# Patient Record
Sex: Male | Born: 1945
Health system: Southern US, Community
[De-identification: ages and names within clinical notes are randomized; demographics above are authoritative.]

## PROBLEM LIST (undated history)

## (undated) DIAGNOSIS — K219 Gastro-esophageal reflux disease without esophagitis: Secondary | ICD-10-CM

## (undated) DIAGNOSIS — A6000 Herpesviral infection of urogenital system, unspecified: Secondary | ICD-10-CM

## (undated) DIAGNOSIS — Z720 Tobacco use: Secondary | ICD-10-CM

## (undated) DIAGNOSIS — R918 Other nonspecific abnormal finding of lung field: Secondary | ICD-10-CM

## (undated) DIAGNOSIS — R001 Bradycardia, unspecified: Secondary | ICD-10-CM

## (undated) DIAGNOSIS — L570 Actinic keratosis: Secondary | ICD-10-CM

## (undated) DIAGNOSIS — E119 Type 2 diabetes mellitus without complications: Secondary | ICD-10-CM

## (undated) DIAGNOSIS — Z7982 Long term (current) use of aspirin: Secondary | ICD-10-CM

## (undated) DIAGNOSIS — M4802 Spinal stenosis, cervical region: Secondary | ICD-10-CM

## (undated) DIAGNOSIS — H04123 Dry eye syndrome of bilateral lacrimal glands: Secondary | ICD-10-CM

## (undated) DIAGNOSIS — I2510 Atherosclerotic heart disease of native coronary artery without angina pectoris: Secondary | ICD-10-CM

## (undated) DIAGNOSIS — I440 Atrioventricular block, first degree: Secondary | ICD-10-CM

## (undated) DIAGNOSIS — G4730 Sleep apnea, unspecified: Secondary | ICD-10-CM

## (undated) DIAGNOSIS — N200 Calculus of kidney: Secondary | ICD-10-CM

## (undated) DIAGNOSIS — I480 Paroxysmal atrial fibrillation: Secondary | ICD-10-CM

## (undated) DIAGNOSIS — I441 Atrioventricular block, second degree: Secondary | ICD-10-CM

## (undated) DIAGNOSIS — I712 Thoracic aortic aneurysm, without rupture, unspecified: Secondary | ICD-10-CM

## (undated) DIAGNOSIS — C439 Malignant melanoma of skin, unspecified: Secondary | ICD-10-CM

## (undated) DIAGNOSIS — Z9841 Cataract extraction status, right eye: Secondary | ICD-10-CM

## (undated) DIAGNOSIS — I700 Atherosclerosis of aorta: Secondary | ICD-10-CM

## (undated) DIAGNOSIS — G629 Polyneuropathy, unspecified: Secondary | ICD-10-CM

## (undated) DIAGNOSIS — C61 Malignant neoplasm of prostate: Secondary | ICD-10-CM

## (undated) DIAGNOSIS — M51369 Other intervertebral disc degeneration, lumbar region without mention of lumbar back pain or lower extremity pain: Secondary | ICD-10-CM

## (undated) DIAGNOSIS — K760 Fatty (change of) liver, not elsewhere classified: Secondary | ICD-10-CM

## (undated) DIAGNOSIS — I4510 Unspecified right bundle-branch block: Secondary | ICD-10-CM

## (undated) DIAGNOSIS — I5030 Unspecified diastolic (congestive) heart failure: Secondary | ICD-10-CM

## (undated) DIAGNOSIS — D329 Benign neoplasm of meninges, unspecified: Secondary | ICD-10-CM

## (undated) DIAGNOSIS — K52832 Lymphocytic colitis: Secondary | ICD-10-CM

## (undated) DIAGNOSIS — E785 Hyperlipidemia, unspecified: Secondary | ICD-10-CM

## (undated) DIAGNOSIS — I10 Essential (primary) hypertension: Secondary | ICD-10-CM

## (undated) DIAGNOSIS — I509 Heart failure, unspecified: Secondary | ICD-10-CM

## (undated) DIAGNOSIS — G3184 Mild cognitive impairment, so stated: Secondary | ICD-10-CM

## (undated) DIAGNOSIS — R2689 Other abnormalities of gait and mobility: Secondary | ICD-10-CM

## (undated) DIAGNOSIS — Z8679 Personal history of other diseases of the circulatory system: Secondary | ICD-10-CM

## (undated) HISTORY — DX: Herpesviral infection of urogenital system, unspecified: A60.00

## (undated) HISTORY — DX: Malignant melanoma of skin, unspecified: C43.9

## (undated) HISTORY — DX: Essential (primary) hypertension: I10

## (undated) HISTORY — DX: Malignant neoplasm of prostate: C61

## (undated) HISTORY — PX: PROSTATE SURGERY: SHX751

## (undated) HISTORY — PX: TONSILLECTOMY: SHX5217

## (undated) HISTORY — DX: Gastro-esophageal reflux disease without esophagitis: K21.9

## (undated) HISTORY — PX: HERNIA REPAIR: SHX51

## (undated) HISTORY — DX: Actinic keratosis: L57.0

## (undated) HISTORY — PX: APPENDECTOMY: SHX54

---

## 2015-02-27 DIAGNOSIS — K219 Gastro-esophageal reflux disease without esophagitis: Secondary | ICD-10-CM | POA: Diagnosis not present

## 2015-02-27 DIAGNOSIS — Z136 Encounter for screening for cardiovascular disorders: Secondary | ICD-10-CM | POA: Diagnosis not present

## 2015-02-27 DIAGNOSIS — Z0000 Encounter for general adult medical examination without abnormal findings: Secondary | ICD-10-CM | POA: Diagnosis not present

## 2015-02-27 DIAGNOSIS — I10 Essential (primary) hypertension: Secondary | ICD-10-CM | POA: Diagnosis not present

## 2015-02-27 DIAGNOSIS — A6000 Herpesviral infection of urogenital system, unspecified: Secondary | ICD-10-CM | POA: Diagnosis not present

## 2015-11-05 DIAGNOSIS — Z23 Encounter for immunization: Secondary | ICD-10-CM | POA: Diagnosis not present

## 2016-03-24 DIAGNOSIS — K219 Gastro-esophageal reflux disease without esophagitis: Secondary | ICD-10-CM | POA: Diagnosis not present

## 2016-03-24 DIAGNOSIS — Z0000 Encounter for general adult medical examination without abnormal findings: Secondary | ICD-10-CM | POA: Diagnosis not present

## 2016-03-24 DIAGNOSIS — Z8546 Personal history of malignant neoplasm of prostate: Secondary | ICD-10-CM | POA: Diagnosis not present

## 2016-03-24 DIAGNOSIS — E782 Mixed hyperlipidemia: Secondary | ICD-10-CM | POA: Diagnosis not present

## 2016-03-24 DIAGNOSIS — I10 Essential (primary) hypertension: Secondary | ICD-10-CM | POA: Diagnosis not present

## 2016-03-24 DIAGNOSIS — A6000 Herpesviral infection of urogenital system, unspecified: Secondary | ICD-10-CM | POA: Diagnosis not present

## 2016-03-24 DIAGNOSIS — Z23 Encounter for immunization: Secondary | ICD-10-CM | POA: Diagnosis not present

## 2017-03-29 DIAGNOSIS — Z23 Encounter for immunization: Secondary | ICD-10-CM | POA: Diagnosis not present

## 2017-03-29 DIAGNOSIS — A6000 Herpesviral infection of urogenital system, unspecified: Secondary | ICD-10-CM | POA: Diagnosis not present

## 2017-03-29 DIAGNOSIS — I10 Essential (primary) hypertension: Secondary | ICD-10-CM | POA: Diagnosis not present

## 2017-03-29 DIAGNOSIS — E782 Mixed hyperlipidemia: Secondary | ICD-10-CM | POA: Diagnosis not present

## 2017-03-29 DIAGNOSIS — K219 Gastro-esophageal reflux disease without esophagitis: Secondary | ICD-10-CM | POA: Diagnosis not present

## 2017-03-29 DIAGNOSIS — Z0000 Encounter for general adult medical examination without abnormal findings: Secondary | ICD-10-CM | POA: Diagnosis not present

## 2017-03-29 DIAGNOSIS — Z8546 Personal history of malignant neoplasm of prostate: Secondary | ICD-10-CM | POA: Diagnosis not present

## 2017-06-24 DIAGNOSIS — M720 Palmar fascial fibromatosis [Dupuytren]: Secondary | ICD-10-CM | POA: Diagnosis not present

## 2017-06-24 DIAGNOSIS — M79641 Pain in right hand: Secondary | ICD-10-CM | POA: Diagnosis not present

## 2017-08-11 DIAGNOSIS — M1811 Unilateral primary osteoarthritis of first carpometacarpal joint, right hand: Secondary | ICD-10-CM | POA: Diagnosis not present

## 2017-08-11 DIAGNOSIS — M79641 Pain in right hand: Secondary | ICD-10-CM | POA: Diagnosis not present

## 2017-09-20 DIAGNOSIS — M13841 Other specified arthritis, right hand: Secondary | ICD-10-CM | POA: Diagnosis not present

## 2017-11-09 DIAGNOSIS — M13841 Other specified arthritis, right hand: Secondary | ICD-10-CM | POA: Diagnosis not present

## 2017-11-17 DIAGNOSIS — H2513 Age-related nuclear cataract, bilateral: Secondary | ICD-10-CM | POA: Diagnosis not present

## 2018-01-20 DIAGNOSIS — M13841 Other specified arthritis, right hand: Secondary | ICD-10-CM | POA: Diagnosis not present

## 2018-01-20 DIAGNOSIS — M65331 Trigger finger, right middle finger: Secondary | ICD-10-CM | POA: Diagnosis not present

## 2018-04-06 DIAGNOSIS — I10 Essential (primary) hypertension: Secondary | ICD-10-CM | POA: Diagnosis not present

## 2018-04-06 DIAGNOSIS — E782 Mixed hyperlipidemia: Secondary | ICD-10-CM | POA: Diagnosis not present

## 2018-04-13 DIAGNOSIS — Z87891 Personal history of nicotine dependence: Secondary | ICD-10-CM | POA: Diagnosis not present

## 2018-04-13 DIAGNOSIS — Z1283 Encounter for screening for malignant neoplasm of skin: Secondary | ICD-10-CM | POA: Diagnosis not present

## 2018-04-13 DIAGNOSIS — Z0000 Encounter for general adult medical examination without abnormal findings: Secondary | ICD-10-CM | POA: Diagnosis not present

## 2018-04-13 DIAGNOSIS — K219 Gastro-esophageal reflux disease without esophagitis: Secondary | ICD-10-CM | POA: Diagnosis not present

## 2018-04-13 DIAGNOSIS — Z8546 Personal history of malignant neoplasm of prostate: Secondary | ICD-10-CM | POA: Diagnosis not present

## 2018-04-13 DIAGNOSIS — I10 Essential (primary) hypertension: Secondary | ICD-10-CM | POA: Diagnosis not present

## 2018-04-13 DIAGNOSIS — A6000 Herpesviral infection of urogenital system, unspecified: Secondary | ICD-10-CM | POA: Diagnosis not present

## 2018-04-13 DIAGNOSIS — E782 Mixed hyperlipidemia: Secondary | ICD-10-CM | POA: Diagnosis not present

## 2018-04-13 DIAGNOSIS — M19041 Primary osteoarthritis, right hand: Secondary | ICD-10-CM | POA: Diagnosis not present

## 2018-04-25 DIAGNOSIS — M65331 Trigger finger, right middle finger: Secondary | ICD-10-CM | POA: Diagnosis not present

## 2018-06-29 DIAGNOSIS — M65331 Trigger finger, right middle finger: Secondary | ICD-10-CM | POA: Diagnosis not present

## 2018-10-05 DIAGNOSIS — E782 Mixed hyperlipidemia: Secondary | ICD-10-CM | POA: Diagnosis not present

## 2018-10-12 DIAGNOSIS — Z87891 Personal history of nicotine dependence: Secondary | ICD-10-CM | POA: Diagnosis not present

## 2018-10-12 DIAGNOSIS — E782 Mixed hyperlipidemia: Secondary | ICD-10-CM | POA: Diagnosis not present

## 2018-10-12 DIAGNOSIS — I10 Essential (primary) hypertension: Secondary | ICD-10-CM | POA: Diagnosis not present

## 2018-10-12 DIAGNOSIS — M19041 Primary osteoarthritis, right hand: Secondary | ICD-10-CM | POA: Diagnosis not present

## 2018-10-12 DIAGNOSIS — Z8546 Personal history of malignant neoplasm of prostate: Secondary | ICD-10-CM | POA: Diagnosis not present

## 2018-10-12 DIAGNOSIS — K219 Gastro-esophageal reflux disease without esophagitis: Secondary | ICD-10-CM | POA: Diagnosis not present

## 2018-10-12 DIAGNOSIS — A6000 Herpesviral infection of urogenital system, unspecified: Secondary | ICD-10-CM | POA: Diagnosis not present

## 2018-10-12 DIAGNOSIS — Z79899 Other long term (current) drug therapy: Secondary | ICD-10-CM | POA: Diagnosis not present

## 2018-11-02 DIAGNOSIS — M720 Palmar fascial fibromatosis [Dupuytren]: Secondary | ICD-10-CM | POA: Diagnosis not present

## 2018-11-02 DIAGNOSIS — M65331 Trigger finger, right middle finger: Secondary | ICD-10-CM | POA: Diagnosis not present

## 2019-01-17 ENCOUNTER — Ambulatory Visit: Payer: PPO | Admitting: Urology

## 2019-01-17 ENCOUNTER — Other Ambulatory Visit: Payer: Self-pay

## 2019-01-17 ENCOUNTER — Encounter: Payer: Self-pay | Admitting: Urology

## 2019-01-17 VITALS — BP 177/78 | HR 92 | Ht 71.0 in | Wt 240.0 lb

## 2019-01-17 DIAGNOSIS — N5231 Erectile dysfunction following radical prostatectomy: Secondary | ICD-10-CM | POA: Diagnosis not present

## 2019-01-17 DIAGNOSIS — Z8546 Personal history of malignant neoplasm of prostate: Secondary | ICD-10-CM

## 2019-01-17 DIAGNOSIS — N393 Stress incontinence (female) (male): Secondary | ICD-10-CM | POA: Diagnosis not present

## 2019-01-17 NOTE — Progress Notes (Signed)
01/17/2019 5:39 PM   Marzetta Board Maris Berger [birth year] [id number]  Referring provider: Wayland Denis, PA-C [address] [place],  [place] [address]  Chief Complaint  Patient presents with  . New Patient (Initial Visit)    HPI: 73 year old male with a personal history of high risk prostate cancer status post radical prostatectomy who presents today to establish care.  He underwent robotic radical prostatectomy for Gleason 4+5 disease in 2012 along with bilateral pelvic lymph node dissection which indicated extraprostatic extension, bilateral seminal vesicle disease.  Lymph nodes were negative.   He underwent adjuvant radiation therapy.  He was previously followed by Dr. Rhodia Albright at [facility] urology and was last seen in 2015.   His PSA remained undetectable until 2014 at which time a 0.01, and 0.02 in 2015.  He is not quite back to his urologist or had his primary care check in 5 years.  He primarily is concerned today about urinary leakage.  He reports that he was not leakage about a year ago.  He does report that over the past year, he has gained 50 pounds from overeating in a more sedentary lifestyle.  He also previously did physical therapy including Kegel exercises which she is no longer doing.  Is very bothered by this.  He does not wear any pads but often has damp underwear and jeans.  He is concerned about the chronic odor and coating in his underwear.  He and his wife are sexually active via oral sex.  He does not have any erectile function this they do not engage in penetrative sex.  He is also concerned today that his wife no longer is interested in performing oral sex due to leakage of urine upon orgasm as well as the odor.  He previously failed PDE 5 inhibitors and vacuum erection device.  He did have some efficacy with intracavernosal injections but is no longer interested in doing these.  He is not interested in penile prosthesis.   PMH: Past Medical History:   Diagnosis Date  . Chronic GERD   . Genital herpes   . Hypertension   . Prostate CA [facility])     Surgical History: Past Surgical History:  Procedure Laterality Date  . APPENDECTOMY    . HERNIA REPAIR    . PROSTATE SURGERY    . TONSILLECTOMY      Home Medications:  Allergies as of 01/17/2019   No Known Allergies     Medication List       Accurate as of January 17, 2019  5:39 PM. If you have any questions, ask your nurse or doctor.        acyclovir 400 MG tablet Commonly known as: ZOVIRAX Take 400 mg by mouth 2 (two) times daily.   amLODipine 5 MG tablet Commonly known as: NORVASC amlodipine 5 mg tablet   atorvastatin 40 MG tablet Commonly known as: LIPITOR atorvastatin 40 mg tablet   etodolac 300 MG capsule Commonly known as: LODINE etodolac 300 mg capsule   hydrochlorothiazide 25 MG tablet Commonly known as: HYDRODIURIL hydrochlorothiazide 25 mg tablet   lisinopril 40 MG tablet Commonly known as: ZESTRIL lisinopril 40 mg tablet   meloxicam 15 MG tablet Commonly known as: MOBIC meloxicam 15 mg tablet   omeprazole 40 MG capsule Commonly known as: PRILOSEC omeprazole 40 mg capsule,delayed release       Allergies: No Known Allergies  Family History: History reviewed. No pertinent family history.  Social History:  reports that he has  never smoked. He has never used smokeless tobacco. He reports current alcohol use. He reports that he does not use drugs.  ROS: UROLOGY Frequent Urination?: Yes Hard to postpone urination?: Yes Burning/pain with urination?: No Get up at night to urinate?: Yes Leakage of urine?: Yes Urine stream starts and stops?: No Trouble starting stream?: No Do you have to strain to urinate?: No Blood in urine?: No Urinary tract infection?: No Sexually transmitted disease?: No Injury to kidneys or bladder?: No Painful intercourse?: No Weak stream?: No Erection problems?: No Penile pain?: No  Gastrointestinal Nausea?: No  Vomiting?: No Indigestion/heartburn?: No Diarrhea?: No Constipation?: No  Constitutional Fever: No Night sweats?: No Weight loss?: No Fatigue?: No  Skin Skin rash/lesions?: No Itching?: No  Eyes Blurred vision?: No Double vision?: No  Ears/Nose/Throat Sore throat?: No Sinus problems?: Yes  Hematologic/Lymphatic Swollen glands?: No Easy bruising?: No  Cardiovascular Leg swelling?: No Chest pain?: No  Respiratory Cough?: No Shortness of breath?: No  Endocrine Excessive thirst?: No  Musculoskeletal Back pain?: No Joint pain?: No  Neurological Headaches?: No Dizziness?: No  Psychologic Depression?: No Anxiety?: No  Physical Exam: BP (!) 177/78   Pulse 92   Ht 5\' 11"  (1.803 m)   Wt 240 lb (108.9 kg)   BMI 33.47 kg/m   Constitutional:  Alert and oriented, No acute distress. HEENT: [place] AT, moist mucus membranes.  Trachea midline, no masses. Cardiovascular: No clubbing, cyanosis, or edema. Respiratory: Normal respiratory effort, no increased work of breathing. GI: Abdomen is soft, nontender, nondistended, no abdominal masses, obese Skin: No rashes, bruises or suspicious lesions. Neurologic: Grossly intact, no focal deficits, moving all 4 extremities. Psychiatric: Normal mood and affect.   Assessment & Plan:    1. History of prostate cancer Personal history of high risk prostate cancer status post prostatectomy and salvage radiation  No PSAs in the past 5 years thus will repeat today  I recommended that he continue with least annual PSA check given his history of high risk disease - PSA  2. Stress incontinence of urine Worsening of stress urinary continence particular over the past year associated with 50 pound weight gain  We discussed options moving forward which include surgical intervention in the form of AUS versus sling and more conservative options which include weight loss and pelvic floor strengthening.  Given that he was dry a year  ago, I strongly feel that more conservative option would likely be very effective.  I have offered him referral to physical therapy but he declined.  He would like to just try to lose weight on his own and he knows how to do pelvic floor exercises.  He is not interested in surgical intervention at this time.  In addition to the above, we did discuss more intermediate options including penile clamp such as Cunningham clamp.  Is not interested in this at this point in time.  To let us know if he would like to pursue this down the road. - PSA  3. Erectile dysfunction after radical prostatectomy Lengthy discussion today about his erectile function, ultimately not interested in any further intervention - PSA    Hollice Espy, MD  [facility] [address] [place], [place] [address] [phone]

## 2019-01-18 LAB — PSA: Prostate Specific Ag, Serum: 0.2 ng/mL (ref 0.0–4.0)

## 2019-01-27 DIAGNOSIS — Z20828 Contact with and (suspected) exposure to other viral communicable diseases: Secondary | ICD-10-CM | POA: Diagnosis not present

## 2019-01-30 ENCOUNTER — Ambulatory Visit
Admission: RE | Admit: 2019-01-30 | Discharge: 2019-01-30 | Disposition: A | Discharge status: Home / Self Care | Payer: PPO | Source: Ambulatory Visit | Attending: Physician Assistant | Admitting: Physician Assistant

## 2019-01-30 ENCOUNTER — Other Ambulatory Visit: Payer: Self-pay

## 2019-01-30 ENCOUNTER — Other Ambulatory Visit: Payer: Self-pay | Admitting: Physician Assistant

## 2019-01-30 DIAGNOSIS — R05 Cough: Secondary | ICD-10-CM | POA: Diagnosis not present

## 2019-01-30 DIAGNOSIS — J309 Allergic rhinitis, unspecified: Secondary | ICD-10-CM | POA: Diagnosis not present

## 2019-01-30 DIAGNOSIS — R059 Cough, unspecified: Secondary | ICD-10-CM

## 2019-01-30 DIAGNOSIS — J329 Chronic sinusitis, unspecified: Secondary | ICD-10-CM | POA: Diagnosis not present

## 2019-01-30 DIAGNOSIS — Z720 Tobacco use: Secondary | ICD-10-CM | POA: Diagnosis not present

## 2019-01-30 DIAGNOSIS — K219 Gastro-esophageal reflux disease without esophagitis: Secondary | ICD-10-CM | POA: Diagnosis not present

## 2019-01-30 IMAGING — CR DG CHEST 2V
2 series · 2 of 2 positions shown · non-contrast
Comparison: No prior.

CLINICAL DATA: Cough.

EXAM:
CHEST - 2 VIEW

[chest pa]
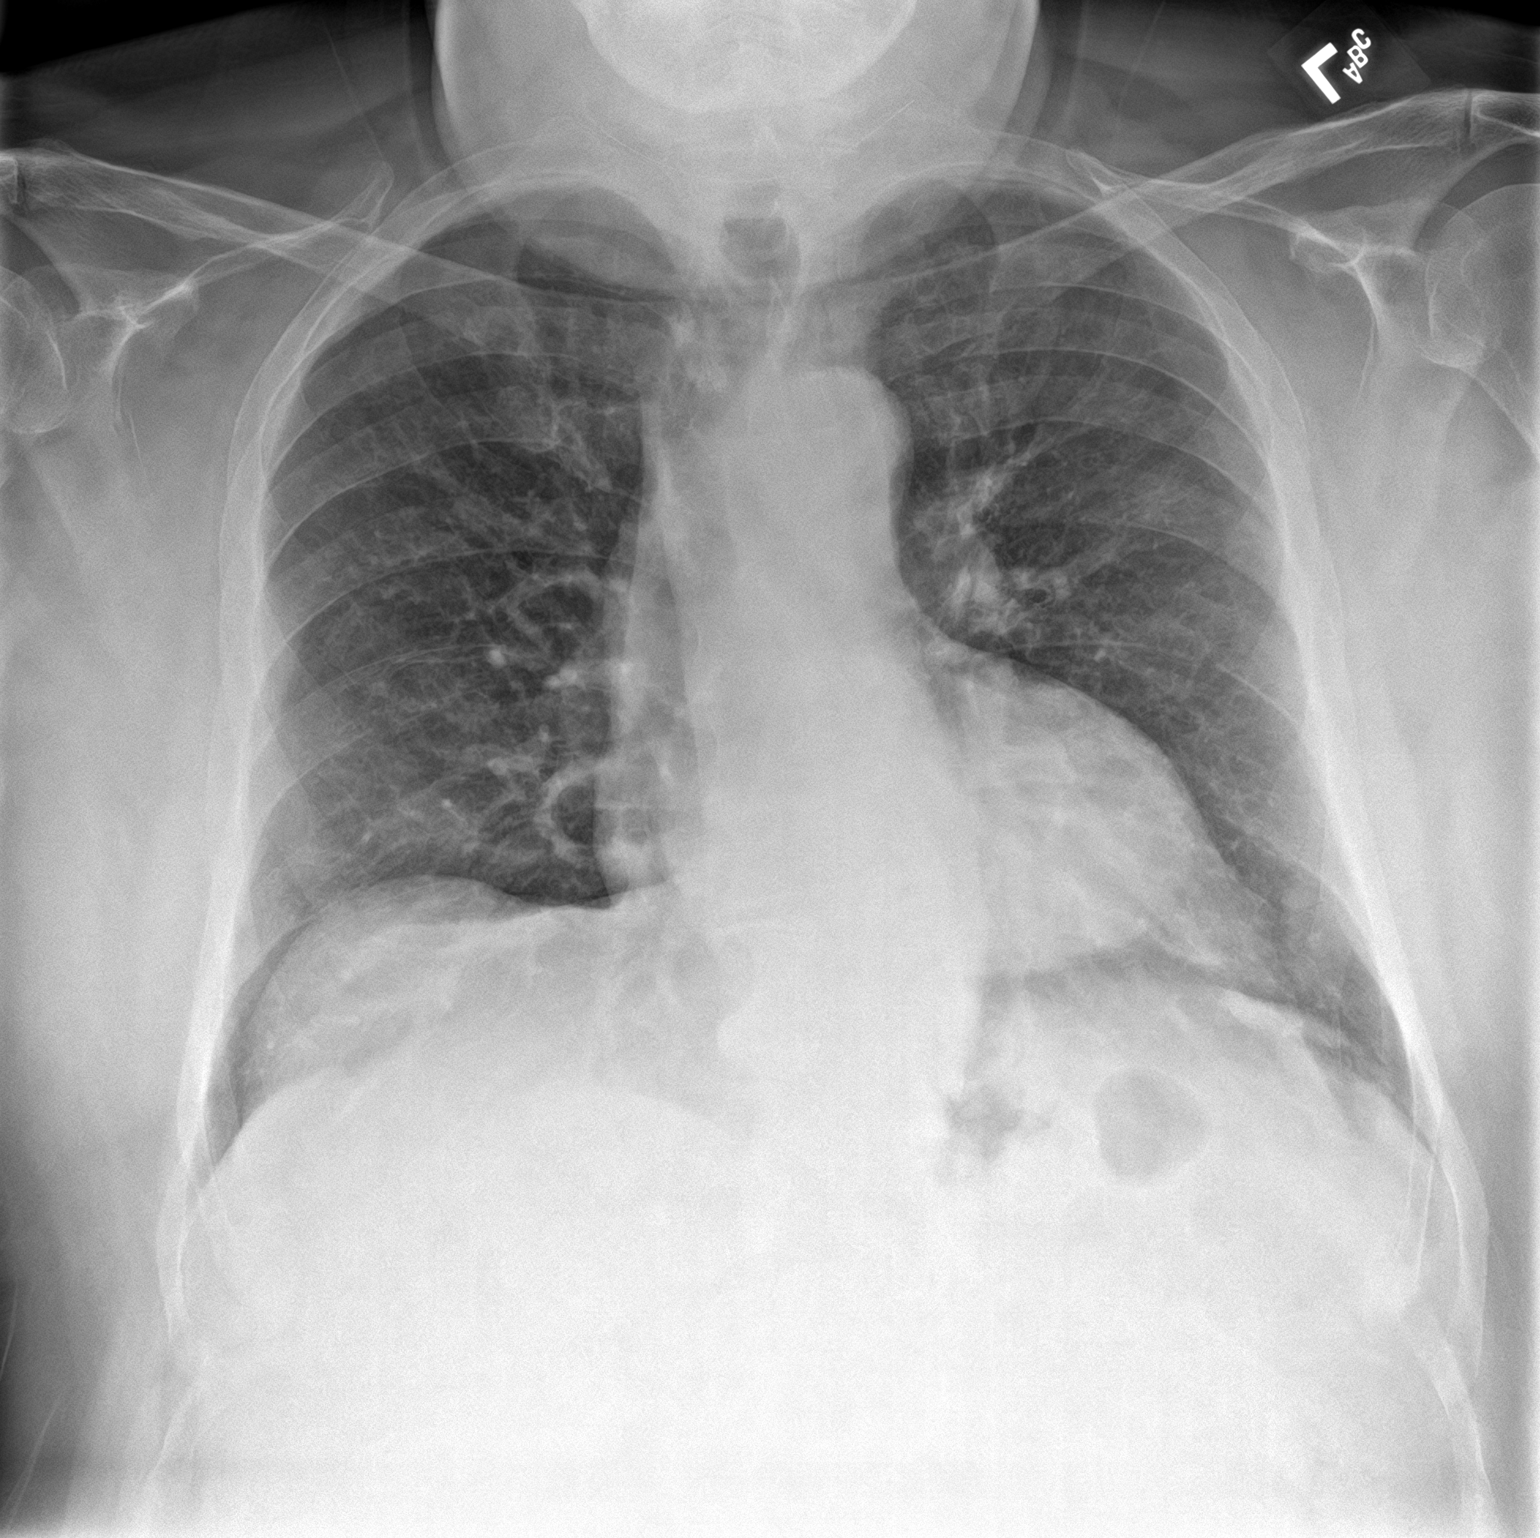

[chest lat]
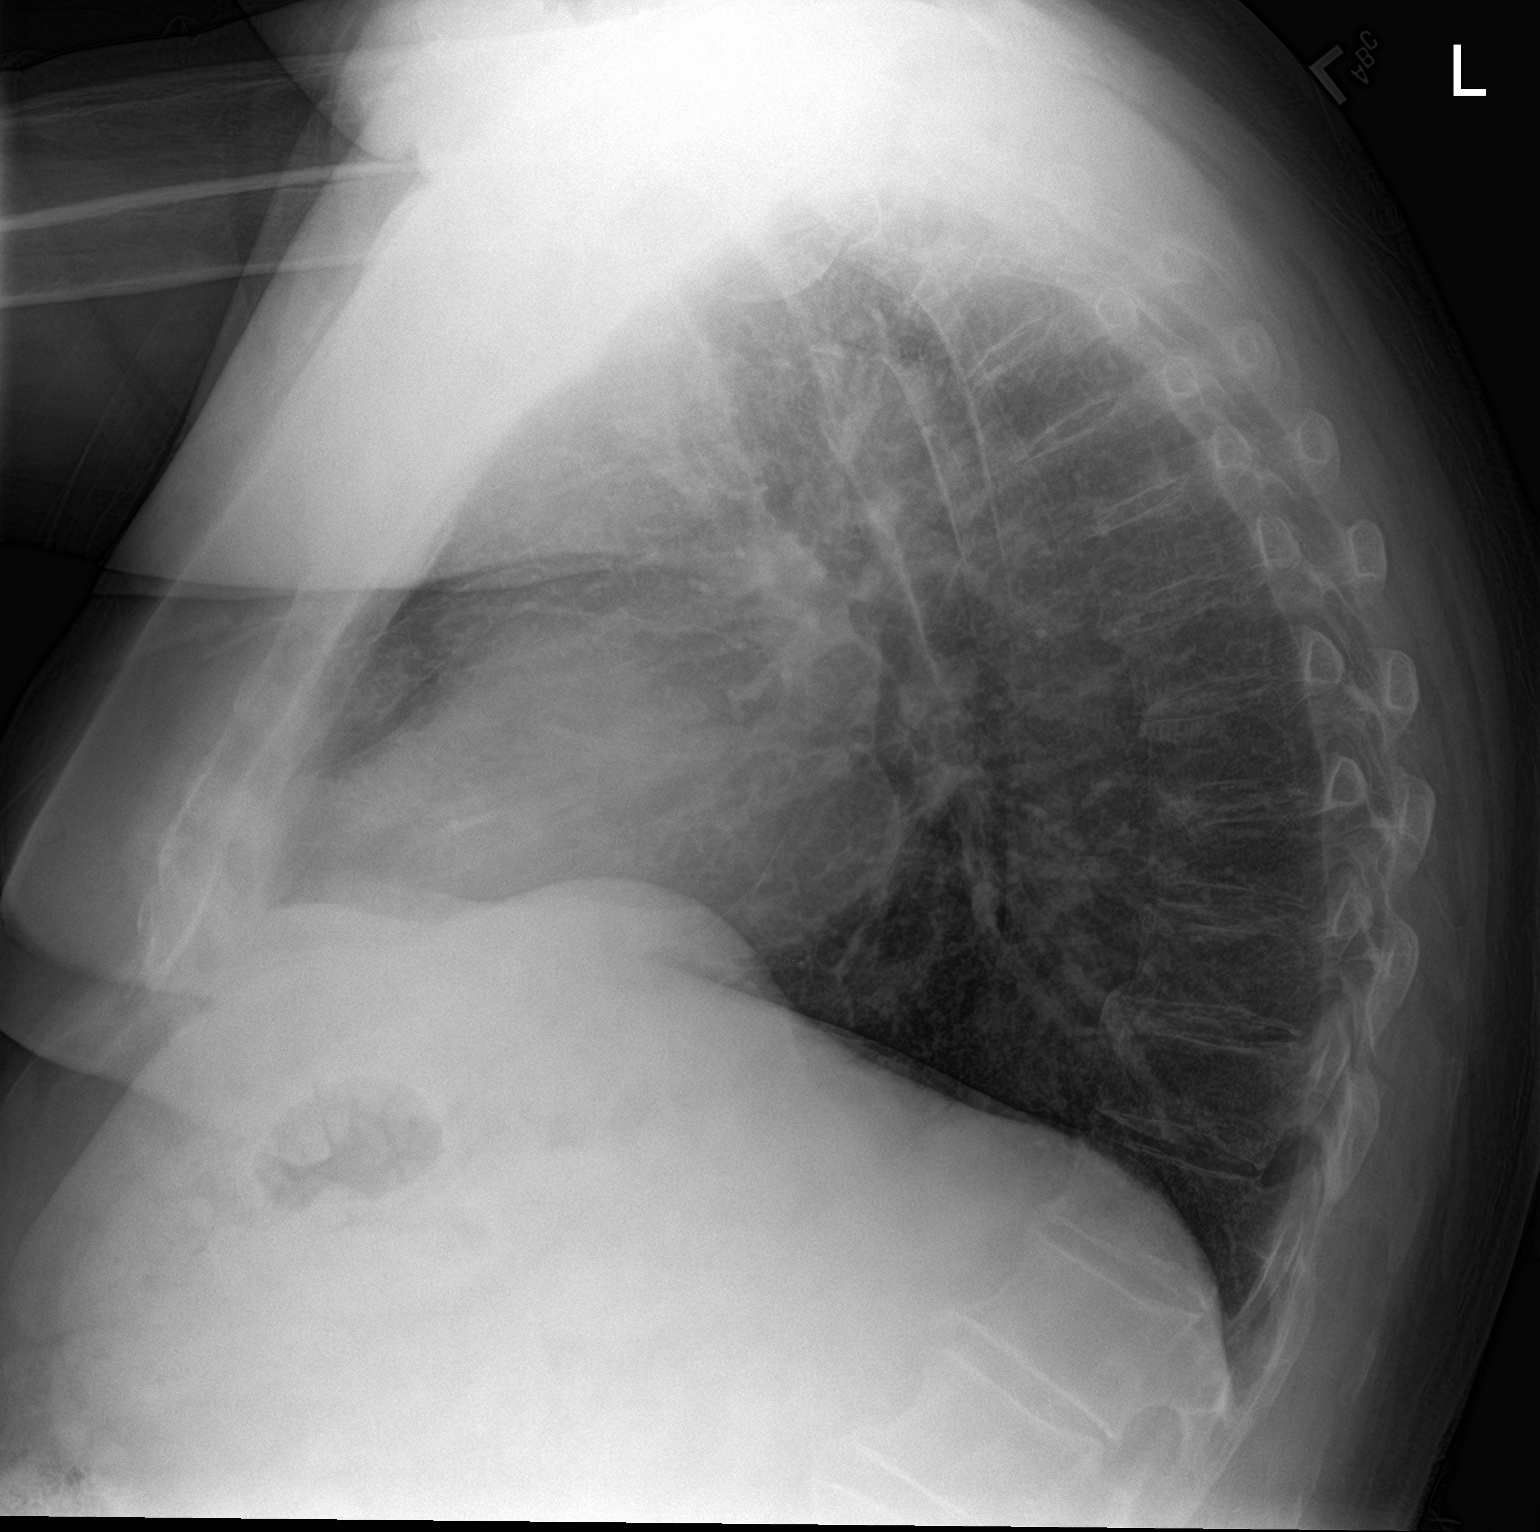

[2 of 2 positions shown; findings below may reference images not displayed]

FINDINGS: Mediastinum and hilar structures normal. Heart size normal. No
pulmonary venous congestion. Questionable cavitary focus in the
medial right base seen on PA view. This could represent a true
cavitary lesion versus superimposition of shadows. Follow-up PA and
lateral chest x-ray suggested. If question remains of a cavitary
lesion, contrast-enhanced chest CT should be considered. Nodular
opacity noted projected over the left lung base on PA view. This
could represent a nipple shadow. The above recommended repeat PA and
lateral chest x-ray should be obtained with nipple markers
suggested. No pleural effusion or pneumothorax. Diffuse osteopenia
degenerative change thoracic spine. No acute bony abnormality.
IMPRESSION: 1. Questionable cavitary focus in the medial right base seen on PA
view. This could represent a true cavitary lesion versus
superimposition of shadows. Follow-up PA and lateral chest x-ray
suggested. If question remains of a cavitary lesion,
contrast-enhanced chest CT should be considered.

2. Nodular opacity noted projected over the left lung base on PA
view. This could represent a nipple shadow. Repeat PA and lateral
chest x-ray recommended above should be obtained with nipple markers
suggested.

## 2019-02-13 DIAGNOSIS — J984 Other disorders of lung: Secondary | ICD-10-CM | POA: Diagnosis not present

## 2019-02-13 DIAGNOSIS — R9389 Abnormal findings on diagnostic imaging of other specified body structures: Secondary | ICD-10-CM | POA: Diagnosis not present

## 2019-02-20 ENCOUNTER — Telehealth: Payer: Self-pay | Admitting: Internal Medicine

## 2019-02-20 NOTE — Telephone Encounter (Signed)
[facility] called and said that [facility] told pt he needed to get a repeat Chest Xray, but he told them that since he has an appt with [facility], he doesn't think he needs to follow up with them anymore. They are wanting to know if ENT needs to follow up with him or if we are going to follow up on the CXR. [facility] is faxing [place] the results of his previous CXR. He has a consult visit w/ [facility] on 03/07/2019. Phone number: [phone]

## 2019-02-20 NOTE — Telephone Encounter (Signed)
Will await CXR.

## 2019-02-21 NOTE — Telephone Encounter (Addendum)
CXR has been received and placed in Jason Washington's folder for review.   Jason Washington, please advise if you will follow up on CXR?

## 2019-03-07 ENCOUNTER — Other Ambulatory Visit: Payer: Self-pay

## 2019-03-07 ENCOUNTER — Ambulatory Visit: Payer: PPO | Admitting: Internal Medicine

## 2019-03-07 ENCOUNTER — Telehealth: Payer: Self-pay | Admitting: Internal Medicine

## 2019-03-07 ENCOUNTER — Encounter: Payer: Self-pay | Admitting: Internal Medicine

## 2019-03-07 VITALS — BP 158/84 | HR 84 | Temp 97.2°F | Ht 71.0 in | Wt 259.0 lb

## 2019-03-07 DIAGNOSIS — R911 Solitary pulmonary nodule: Secondary | ICD-10-CM

## 2019-03-07 DIAGNOSIS — R0602 Shortness of breath: Secondary | ICD-10-CM | POA: Diagnosis not present

## 2019-03-07 DIAGNOSIS — R9389 Abnormal findings on diagnostic imaging of other specified body structures: Secondary | ICD-10-CM | POA: Diagnosis not present

## 2019-03-07 MED ORDER — ALBUTEROL SULFATE HFA 108 (90 BASE) MCG/ACT IN AERS: 2.0000 | INHALATION_SPRAY | RESPIRATORY_TRACT | 10 refills | Status: DC | PRN

## 2019-03-07 NOTE — Telephone Encounter (Signed)
DK, please advise. thanks

## 2019-03-07 NOTE — Patient Instructions (Signed)
Obtain CT CHEST to look for cancer  Check PFT's to check for COPD  Check oxygen levels with 6MWT and ONO  START ALBUTEROL INHALER

## 2019-03-07 NOTE — Telephone Encounter (Signed)
Rhonda please advise. thanks

## 2019-03-07 NOTE — Telephone Encounter (Signed)
Pt in office for OV and CXR was discussed during visit.

## 2019-03-07 NOTE — Progress Notes (Signed)
Name: Jason Washington MRN: [id number] DOB: 04-06-1945     CONSULTATION DATE: 03/07/2019  REFERRING MD : Eulas Post  CHIEF COMPLAINT: Shortness of breath and abnormal chest x-ray  HISTORY OF PRESENT ILLNESS: 74 year old pleasant white male morbidly obese presents to the office today for evaluation for abnormal chest x-ray with chronic cough for 3 months  Patient started to have cough several months ago nonproductive in nature but intermittently productive Patient was evaluated by ENT and there was no acute process per the patient  At this time patient does have some hoarse voice and some increased fatigue along with some increased shortness of breath and dyspnea exertion that has been progressively worsening over the last several years and he definitely has been worsening over the last several months  Patient denies any weight loss but has gained 50 pounds over the last several years Patient does have extensive smoking history of 1 pack a day for approximately 40 years He quit 10 years ago however restarted smoking several months ago  At this time chest x-ray was obtained on December 2020 Findings were viewed with the patient there is a right midlung cavitary lesion along with a left lung nodular opacity  I have explained to the patient he will need CT chest for further evaluation I have explained to him I am concerned for primary lung cancer   Patient does have increased fatigue increased shortness of breath and dyspnea on exertion over the last several months I am also concerned about COPD and emphysema I have explained to the patient he will need pulmonary function testing to assess for emphysema and COPD Patient does have intermittent wheezing  No of COPD exacerbation at this time No evidence of heart failure at this time No evidence or signs of infection at this time No respiratory distress No fevers, chills, nausea, vomiting, diarrhea No evidence of lower extremity  edema No evidence hemoptysis    PAST MEDICAL HISTORY :   has a past medical history of Chronic GERD, Genital herpes, Hypertension, and Prostate CA ([place]).  has a past surgical history that includes Appendectomy; Prostate surgery; Hernia repair; and Tonsillectomy. Prior to Admission medications   Medication Sig Start Date End Date Taking? Authorizing Provider  acyclovir (ZOVIRAX) 400 MG tablet Take 400 mg by mouth 2 (two) times daily. 01/04/19  Yes [provider]  amLODipine (NORVASC) 5 MG tablet amlodipine 5 mg tablet 09/28/13  Yes [provider]  atorvastatin (LIPITOR) 40 MG tablet atorvastatin 40 mg tablet 04/13/18  Yes [provider]  etodolac (LODINE) 300 MG capsule etodolac 300 mg capsule   Yes [provider]  hydrochlorothiazide (HYDRODIURIL) 25 MG tablet hydrochlorothiazide 25 mg tablet 09/13/13  Yes [provider]  lisinopril (ZESTRIL) 40 MG tablet lisinopril 40 mg tablet 09/13/13  Yes [provider]  meloxicam (MOBIC) 15 MG tablet meloxicam 15 mg tablet   Yes [provider]  omeprazole (PRILOSEC) 40 MG capsule omeprazole 40 mg capsule,delayed release 09/04/13  Yes [provider]   No Known Allergies  FAMILY HISTORY:  family history is not on file. SOCIAL HISTORY:  reports that he has never smoked. He has never used smokeless tobacco. He reports current alcohol use. He reports that he does not use drugs.    Review of Systems:  Gen:  Denies  fever, sweats, chills weigh loss  HEENT: Denies blurred vision, double vision, ear pain, eye pain, hearing loss, nose bleeds, sore throat Cardiac:  No dizziness, chest pain or  heaviness, chest tightness,edema, No JVD Resp:   No cough, -sputum production, +shortness of breath,+wheezing, -hemoptysis,  Gi: Denies swallowing difficulty, stomach pain, nausea or vomiting, diarrhea, constipation, bowel incontinence Gu:  Denies bladder incontinence, burning urine Ext:    Denies Joint pain, stiffness or swelling Skin: Denies  skin rash, easy bruising or bleeding or hives Endoc:  Denies polyuria, polydipsia , polyphagia or weight change Psych:   Denies depression, insomnia or hallucinations  Other:  All other systems negative   BP (!) 158/84 (BP Location: Left Arm, Cuff Size: Normal)   Pulse 84   Temp (!) 97.2 F (36.2 C) (Temporal)   Ht 5\' 11"  (1.803 m)   Wt 259 lb (117.5 kg)   SpO2 97%   BMI 36.12 kg/m    Physical Examination:   GENERAL:NAD, no fevers, chills, no weakness no fatigue HEAD: Normocephalic, atraumatic.  EYES: PERLA, EOMI No scleral icterus.  MOUTH: Moist mucosal membrane.  EAR, NOSE, THROAT: Clear without exudates. No external lesions.  NECK: Supple.  PULMONARY: CTA B/L no wheezing, rhonchi, crackles CARDIOVASCULAR: S1 and S2. Regular rate and rhythm. No murmurs GASTROINTESTINAL: Soft, nontender, nondistended. Positive bowel sounds.  MUSCULOSKELETAL: No swelling, clubbing, or edema.  NEUROLOGIC: No gross focal neurological deficits. 5/5 strength all extremities SKIN: No ulceration, lesions, rashes, or cyanosis.  PSYCHIATRIC: Insight, judgment intact. -depression -anxiety ALL OTHER ROS ARE NEGATIVE   MEDICATIONS: I have reviewed all medications and confirmed regimen as documented      IMAGING   Chest x-ray 12/20 Independently reviewed by me today There is a right midlung opacification looks like a cavitary lesion Recommend CT chest for further evaluation   ASSESSMENT AND PLAN SYNOPSIS 73 year old morbidly obese white male with progressive shortness of breath and dyspnea exertion with chronic cough for the last several months with extensive smoking history in the setting of abnormal chest x-ray with right midlung cavitary lesion most likely findings consistent with primary lung cancer however will need CT of the chest for further evaluation.  With progressive dyspnea and exertional shortness of breath patient most likely  has underlying COPD and will need pulmonary function testing to assess for lung function, will also need to be assessed for exertional and nocturnal hypoxia  Shortness of breath and dyspnea exertion Likely COPD Obtain pulmonary function testing Check for exertional hypoxia with 6-minute walk test Check for nocturnal hypoxia with overnight pulse oximetry  +wheezing and progressive shortness of breath Albuterol as needed Patient to be provided in the office   Abnormal chest x-ray with right midlung cavitary lesion I am very concerned that this could be primary lung cancer Patient will need CT of the chest for further assessment     COVID-19 EDUCATION: The signs and symptoms of COVID-19 were discussed with the patient and how to seek care for testing.  The importance of social distancing was discussed today. Hand Washing Techniques and avoid touching face was advised.     MEDICATION ADJUSTMENTS/LABS AND TESTS ORDERED: CT chest needed for further evaluation Butyryl as needed Obtain pulmonary function testing Check 6-minute walk test Check overnight pulse oximetry   CURRENT MEDICATIONS REVIEWED AT LENGTH WITH PATIENT TODAY   Patient satisfied with Plan of action and management. All questions answered  Follow up in 3 months    Stanford Strauch Patricia Pesa, M.D.  [facility]  Medical Director [place] Director [facility] Cardio-Pulmonary Department

## 2019-03-07 NOTE — Telephone Encounter (Signed)
Pt stated that he received call from [employer] stating that he would need to come and pick up the device from [place].  LMOVM for Darlina Guys to return my call.  Spoke with Melissa with [employer] and she stated that she would check into this issue. [employer] does have a branch in [place] and does not know why patient was advised to go to [place]. [name]

## 2019-03-08 NOTE — Telephone Encounter (Signed)
Melissa with [employer] called back this morning and stated that [employer] will reach out to patient this this morning and arrange for this device to be delivered to patient. [place] and spoke with patient at 11:20 and pt stated that he has heard from [employer] and that device will be delivered to his home and picked up the next day.  Pt thanked me for the help. Nothing else needed at this time. [name]

## 2019-03-12 ENCOUNTER — Ambulatory Visit (INDEPENDENT_AMBULATORY_CARE_PROVIDER_SITE_OTHER): Payer: PPO

## 2019-03-12 ENCOUNTER — Other Ambulatory Visit: Payer: Self-pay

## 2019-03-12 DIAGNOSIS — R0609 Other forms of dyspnea: Secondary | ICD-10-CM

## 2019-03-12 DIAGNOSIS — J449 Chronic obstructive pulmonary disease, unspecified: Secondary | ICD-10-CM | POA: Diagnosis not present

## 2019-03-12 DIAGNOSIS — R0600 Dyspnea, unspecified: Secondary | ICD-10-CM | POA: Diagnosis not present

## 2019-03-12 DIAGNOSIS — R0902 Hypoxemia: Secondary | ICD-10-CM | POA: Diagnosis not present

## 2019-03-12 NOTE — Progress Notes (Signed)
SIX MIN WALK 03/12/2019  Medications zovirax 400mg , Norvasc 5mg , Lipitor 40mg , Lisinopril 40mg , Mobic 15mg  and prilosec 40mg   Supplimental Oxygen during Test? (L/min) No  Laps 10  Partial Lap (in Meters) 0  Baseline BP (sitting) 132/72  Baseline Heartrate 94  Baseline Dyspnea (Borg Scale) 0  Baseline Fatigue (Borg Scale) 0  Baseline SPO2 95  BP (sitting) 144/86  Heartrate 100  Dyspnea (Borg Scale) 0  Fatigue (Borg Scale) 0  SPO2 95  BP (sitting) 138/82  Heartrate 90  SPO2 96  Stopped or Paused before Six Minutes No  Distance Completed 340  Provider Comments: pt completed test at moderate pace with no complaints.

## 2019-03-19 ENCOUNTER — Ambulatory Visit
Admission: RE | Admit: 2019-03-19 | Discharge: 2019-03-19 | Disposition: A | Discharge status: Home / Self Care | Payer: PPO | Source: Ambulatory Visit | Attending: Internal Medicine | Admitting: Internal Medicine

## 2019-03-19 ENCOUNTER — Telehealth: Payer: Self-pay | Admitting: Internal Medicine

## 2019-03-19 ENCOUNTER — Other Ambulatory Visit: Payer: Self-pay

## 2019-03-19 DIAGNOSIS — R918 Other nonspecific abnormal finding of lung field: Secondary | ICD-10-CM | POA: Diagnosis not present

## 2019-03-19 DIAGNOSIS — R911 Solitary pulmonary nodule: Secondary | ICD-10-CM | POA: Diagnosis not present

## 2019-03-19 IMAGING — CT CT CHEST W/O CM
2 of 4 series · 15 of 36 positions shown, 18 images · non-contrast
Comparison: [DATE].

CLINICAL DATA: Lung nodule.

EXAM:
CT CHEST WITHOUT CONTRAST
TECHNIQUE: Multidetector CT imaging of the chest was performed following the
standard protocol without IV contrast.

[Series 2: chest 2.00 · axial · 0.75mm/px · z∈[-1214,-924]mm · 12 of 173 slices shown, 15 images]
[im 14/173  mediastinal]
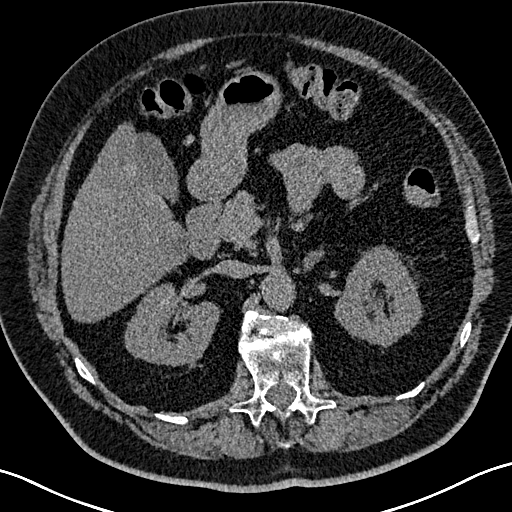
[im 14/173  lung]
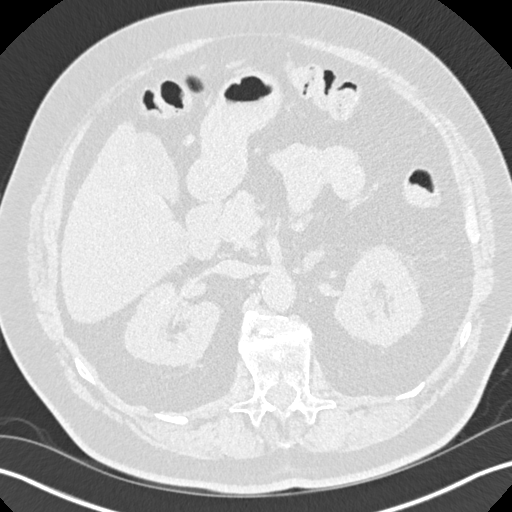
[im 27/173  lung]
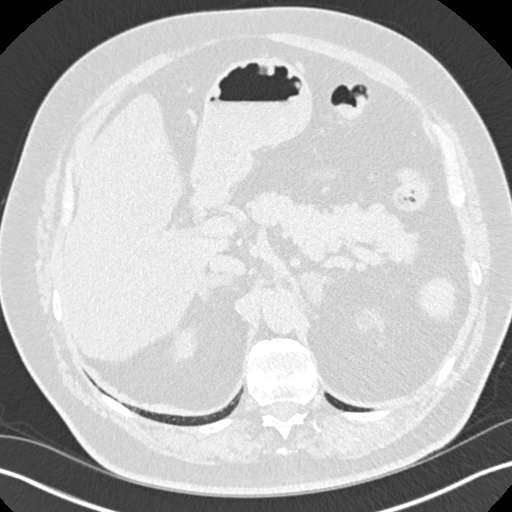
[im 40/173  lung]
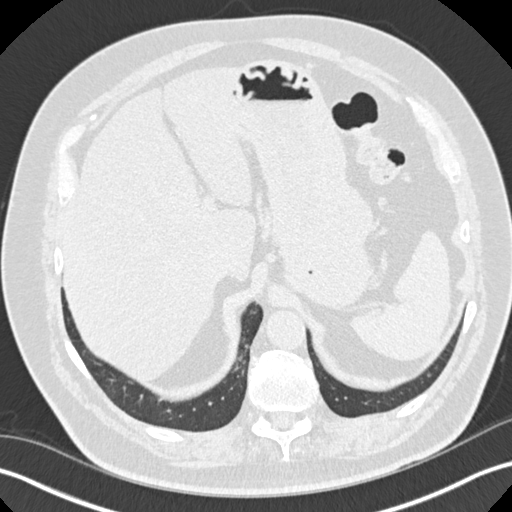
[im 53/173  lung]
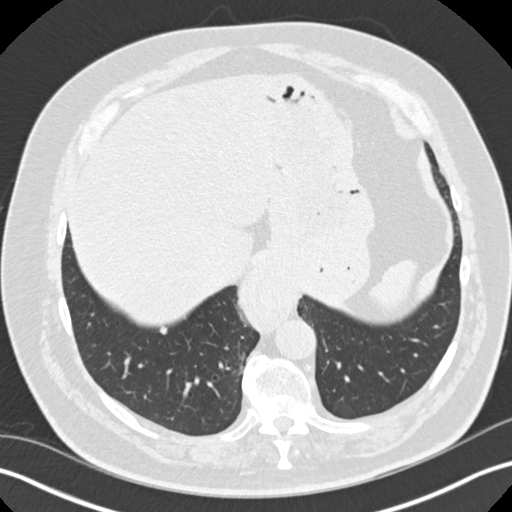
[im 67/173  mediastinal]
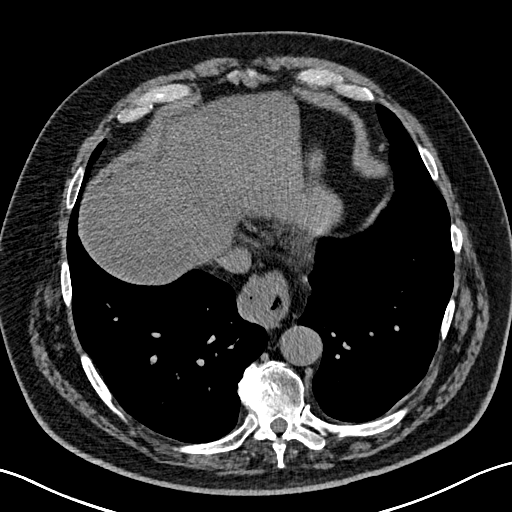
[im 67/173  lung]
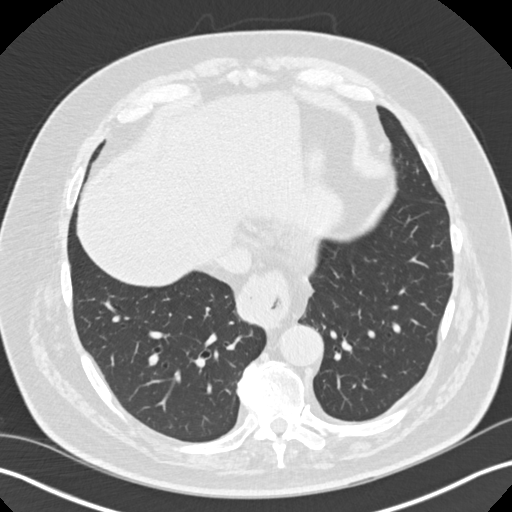
[im 80/173  lung]
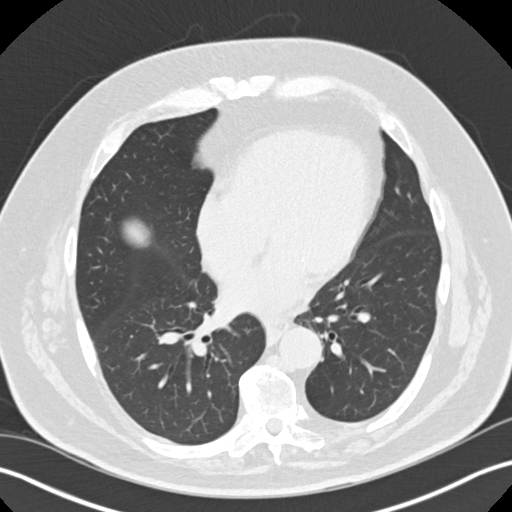
[im 93/173  lung]
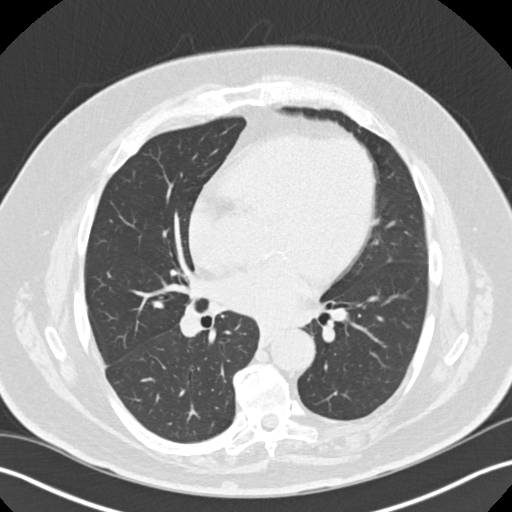
[im 106/173  lung]
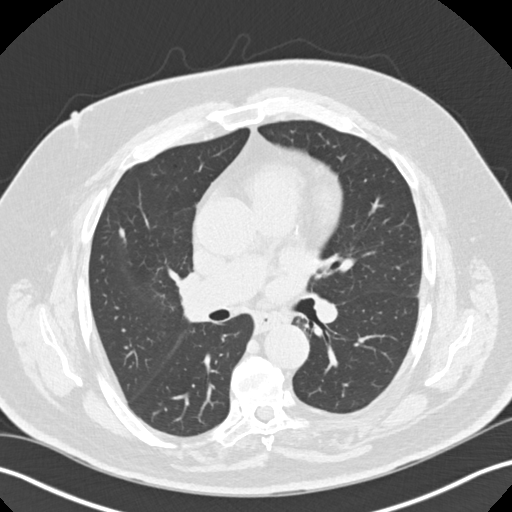
[im 120/173  mediastinal]
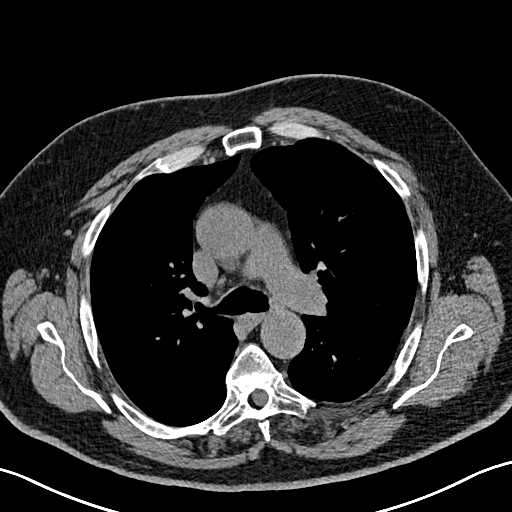
[im 120/173  lung]
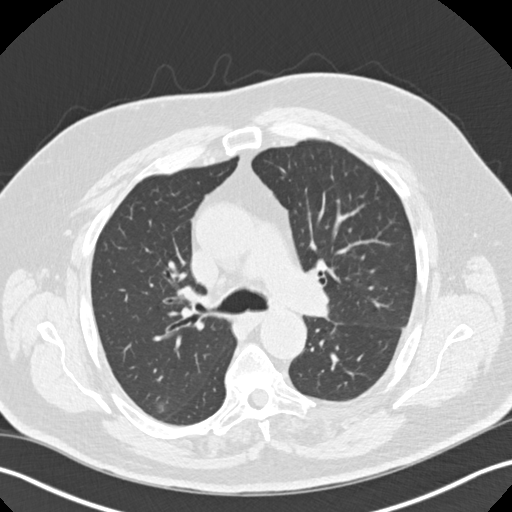
[im 133/173  lung]
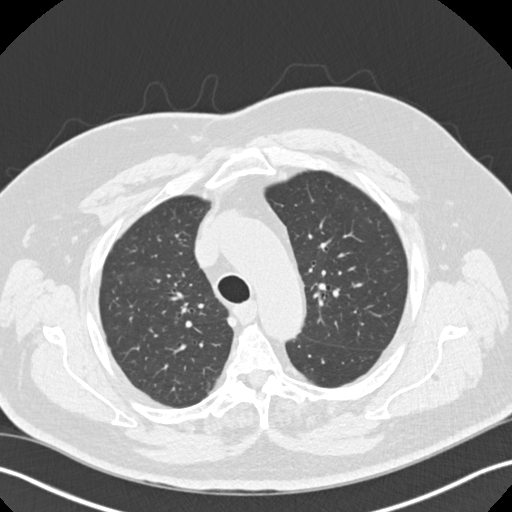
[im 146/173  lung]
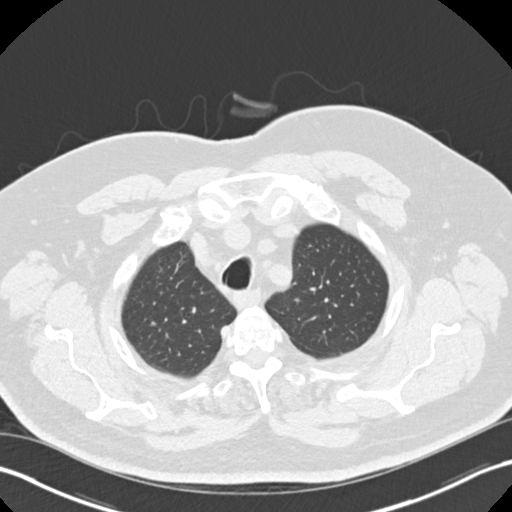
[im 159/173  lung]
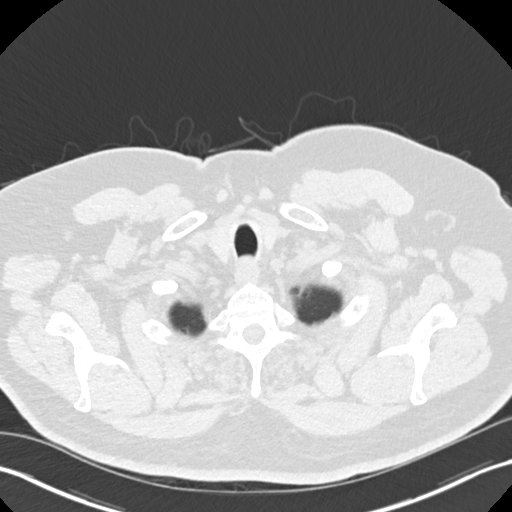

[Series 5: coronals chest 2.00 cor · coronal · 0.68mm/px · 3 of 178 slices shown]
[im 36/178  lung]
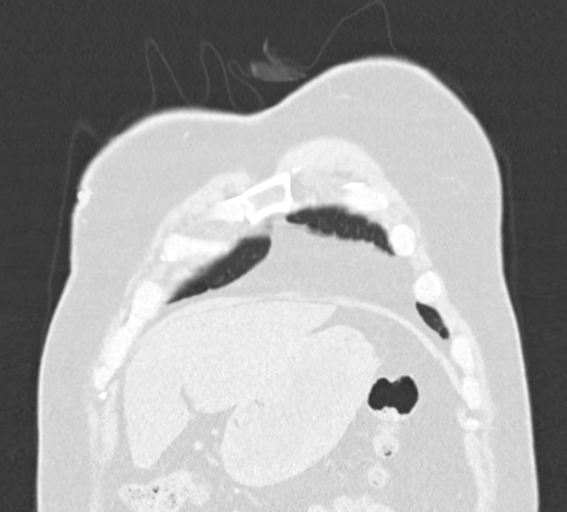
[im 71/178  lung]
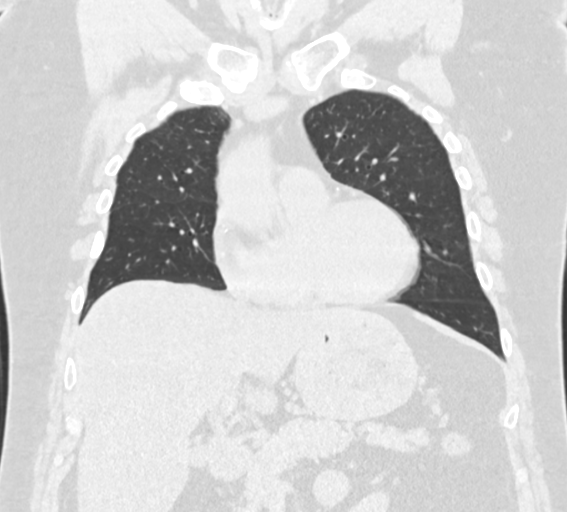
[im 107/178  lung]
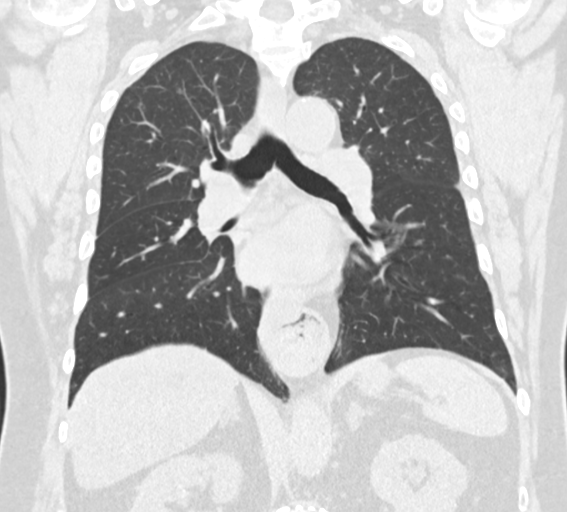

[15 of 36 positions shown; findings below may reference images not displayed]

FINDINGS: Cardiovascular: 4.2 cm ascending thoracic aortic aneurysm is noted.
Atherosclerosis of thoracic aorta is noted. Normal cardiac size. No
pericardial effusion. Minimal coronary artery calcifications are
noted.

Mediastinum/Nodes: Thyroid gland is unremarkable. No significant
adenopathy is noted. Moderate size sliding-type hiatal hernia is
noted.

Lungs/Pleura: No pneumothorax or pleural effusion is noted. Multiple
pulmonary nodules are noted bilaterally. The largest is a subpleural
nodule measuring 7 mm in the left lower lobe laterally best seen on
image 109 of series 3. 7 mm ground-glass opacity is noted
posteriorly in the right lower lobe best seen on image number 55 of
series 3.

Upper Abdomen: Hepatic steatosis is noted. No gallstones or biliary
dilatation is noted. However, there appears to be multiple rounded
hypoechoic abnormalities in the liver, with the largest measuring
2.5 cm posterior segment of right hepatic lobe. This is concerning
for possible metastatic disease.

Musculoskeletal: No chest wall mass or suspicious bone lesions
identified.
IMPRESSION: Multiple rounded hypoechoic abnormalities are noted in the liver,
with the largest measuring 2.5 cm in the right hepatic lobe. MRI of
the liver with and without gadolinium is recommended evaluate for
possible metastatic disease. Hepatic steatosis is noted as well.
These results will be called to the ordering clinician or
representative by the Radiologist Assistant, and communication
documented in the PACS or zVision Dashboard.

Multiple pulmonary nodules are noted bilaterally, the largest
measuring 7 mm. Non-contrast chest CT at 3-6 months is recommended.
If the nodules are stable at time of repeat CT, then future CT at
18-24 months (from today's scan) is considered optional for low-risk
patients, but is recommended for high-risk patients. This
recommendation follows the consensus statement: Guidelines for
Management of Incidental Pulmonary Nodules Detected on CT Images:

4.2 cm ascending thoracic aortic aneurysm. Recommend annual imaging
followup by CTA or MRA. This recommendation follows [HO]
ACCF/AHA/AATS/ACR/ASA/SCA/BUMKYU/BUMKYU/BUMKYU/BUMKYU Guidelines for the
Diagnosis and Management of Patients with Thoracic Aortic Disease.
Circulation. [HO]; 121: [id number]. Aortic aneurysm NOS
([HO]-[HO]).

Moderate size sliding-type hiatal hernia.

Aortic Atherosclerosis ([HO]-[HO]).

## 2019-03-19 NOTE — Telephone Encounter (Addendum)
Received call report from [facility] with [facility] radiology on CT Report is within epic.   Dr. Mortimer Fries, please advise. thanks

## 2019-03-20 NOTE — Telephone Encounter (Signed)
Will route to Wyn Quaker, NP for review, as Dr. Mortimer Fries is unavailable.

## 2019-03-20 NOTE — Telephone Encounter (Signed)
03/20/2019 1211  Reviewed patient CT call report:  IMPRESSION: Multiple rounded hypoechoic abnormalities are noted in the liver, with the largest measuring 2.5 cm in the right hepatic lobe. MRI of the liver with and without gadolinium is recommended evaluate for possible metastatic disease. Hepatic steatosis is noted as well. These results will be called to the ordering clinician or representative by the Radiologist Assistant, and communication documented in the PACS or zVision Dashboard.  Multiple pulmonary nodules are noted bilaterally, the largest measuring 7 mm. Non-contrast chest CT at 3-6 months is recommended. If the nodules are stable at time of repeat CT, then future CT at 18-24 months (from today's scan) is considered optional for low-risk patients, but is recommended for high-risk patients. This recommendation follows the consensus statement: Guidelines for Management of Incidental Pulmonary Nodules Detected on CT Images: From the Fleischner Society 2017; Radiology 2017; 284:228-243.  4.2 cm ascending thoracic aortic aneurysm. Recommend annual imaging followup by CTA or MRA. This recommendation follows 2010 ACCF/AHA/AATS/ACR/ASA/SCA/SCAI/SIR/STS/SVM Guidelines for the Diagnosis and Management of Patients with Thoracic Aortic Disease. Circulation. 2010; [id number]. Aortic aneurysm NOS (ICD10-I71.9).  Moderate size sliding-type hiatal hernia.  Aortic Atherosclerosis (ICD10-I70.0).  We need to have these results routed to patient's primary care provider.  Please also ensure that they are aware that CT showing potential concerns for liver abnormalities.  Patient likely will need MRI imaging of liver.  Primary care will need to order this or coordinate gastroenterology follow-up.  From a pulmonary standpoint patient is showing pulmonary nodules the largest being 7 mm.  We will repeat a CT of his chest in 3 to 6 months.  I will defer to Dr. Mortimer Fries on the timing of the CT  as he last saw the patient.  We should contact the patient and let them know that the CT of chest is showing some abnormalities in the liver and we need to have better imaging of this.  We are recommending that they follow back up with primary care to obtain this imaging.  They may benefit from a gastroenterology referral.  Please also the patient know that we are seeing some pulmonary nodules the largest being 7 mm.  We tend to be more concerned about these when they are greater than 8 mm.  We will follow these closely with a repeat CT chest over the next 3 to 6 months.  Please ensure patient has scheduled follow-up with Dr. Mortimer Fries or an APP to further review these results.  Jason Quaker, FNP

## 2019-03-20 NOTE — Telephone Encounter (Signed)
Called PCP office and made them aware. I will fax over CT results. Called pt and lm for him to callback so I can review results with pt.

## 2019-03-21 NOTE — Telephone Encounter (Signed)
Spoke with pt and reviewed results with him.  I ordered CT Chest w/o for May 2021. I will put in a recall for a f/u appt. Pt verbalized understanding. Nothing further needed.

## 2019-03-26 ENCOUNTER — Telehealth: Payer: Self-pay | Admitting: Internal Medicine

## 2019-03-26 ENCOUNTER — Telehealth: Payer: Self-pay

## 2019-03-26 DIAGNOSIS — J449 Chronic obstructive pulmonary disease, unspecified: Secondary | ICD-10-CM

## 2019-03-26 NOTE — Telephone Encounter (Signed)
Received pulse ox report from [employer] and called patient to let him know that DK is recommending him be on 2L of O2 [place].  He verbalized his understanding and nothing further needed.

## 2019-03-26 NOTE — Telephone Encounter (Signed)
Pt wanted to know if the test documented that the pulse ox came off his finger and if that would affect his results. Let pt know that everything is documented. He verbalized his understanding and stated he wants to continue with the O2 process. Nothing further needed.

## 2019-04-09 ENCOUNTER — Telehealth: Payer: Self-pay | Admitting: Internal Medicine

## 2019-04-09 DIAGNOSIS — E782 Mixed hyperlipidemia: Secondary | ICD-10-CM | POA: Diagnosis not present

## 2019-04-09 DIAGNOSIS — I10 Essential (primary) hypertension: Secondary | ICD-10-CM | POA: Diagnosis not present

## 2019-04-09 DIAGNOSIS — R7309 Other abnormal glucose: Secondary | ICD-10-CM | POA: Diagnosis not present

## 2019-04-10 NOTE — Telephone Encounter (Signed)
Called pt and let him what DK said. He verbalized his understanding and nothing further needed.

## 2019-04-10 NOTE — Telephone Encounter (Signed)
Pt states that he can not tell a difference when using the O2 at night and wants to know if it is necessary to continue.  He also asked about the PFT and scheduling. I explained to him that we are currently not doing them here due to covid and he will be scheduled as soon as they become open again.  DK please advise.

## 2019-04-10 NOTE — Telephone Encounter (Signed)
People do NOT realize what happens to oxygen levels at night Please continue oxygen as prescribed

## 2019-04-16 DIAGNOSIS — I700 Atherosclerosis of aorta: Secondary | ICD-10-CM | POA: Diagnosis not present

## 2019-04-16 DIAGNOSIS — K760 Fatty (change of) liver, not elsewhere classified: Secondary | ICD-10-CM | POA: Diagnosis not present

## 2019-04-16 DIAGNOSIS — C61 Malignant neoplasm of prostate: Secondary | ICD-10-CM | POA: Diagnosis not present

## 2019-04-16 DIAGNOSIS — I712 Thoracic aortic aneurysm, without rupture: Secondary | ICD-10-CM | POA: Diagnosis not present

## 2019-04-16 DIAGNOSIS — I10 Essential (primary) hypertension: Secondary | ICD-10-CM | POA: Diagnosis not present

## 2019-04-16 DIAGNOSIS — E119 Type 2 diabetes mellitus without complications: Secondary | ICD-10-CM | POA: Diagnosis not present

## 2019-04-16 DIAGNOSIS — Z23 Encounter for immunization: Secondary | ICD-10-CM | POA: Diagnosis not present

## 2019-04-16 DIAGNOSIS — Z87891 Personal history of nicotine dependence: Secondary | ICD-10-CM | POA: Diagnosis not present

## 2019-04-16 DIAGNOSIS — E782 Mixed hyperlipidemia: Secondary | ICD-10-CM | POA: Diagnosis not present

## 2019-04-16 DIAGNOSIS — K219 Gastro-esophageal reflux disease without esophagitis: Secondary | ICD-10-CM | POA: Diagnosis not present

## 2019-04-16 DIAGNOSIS — Z8546 Personal history of malignant neoplasm of prostate: Secondary | ICD-10-CM | POA: Diagnosis not present

## 2019-04-16 DIAGNOSIS — M19041 Primary osteoarthritis, right hand: Secondary | ICD-10-CM | POA: Diagnosis not present

## 2019-04-16 DIAGNOSIS — Z0000 Encounter for general adult medical examination without abnormal findings: Secondary | ICD-10-CM | POA: Diagnosis not present

## 2019-04-26 ENCOUNTER — Telehealth: Payer: Self-pay | Admitting: Internal Medicine

## 2019-04-26 NOTE — Telephone Encounter (Signed)
Spoke to Jason Washington with [facility], who stated that pt had recent CT of chest that showed abnormalities with liver as well.  Jason Washington is questioning if our office will follow up on liver as well as chest.  I have made Jason Washington aware that it appears that we will only be following chest abnormalities, as pt has CT chest scheduled for 06/2019. Jason Washington stated that PCP will follow up on liver.  Nothing further is needed.

## 2019-05-17 ENCOUNTER — Telehealth: Payer: Self-pay | Admitting: Internal Medicine

## 2019-05-17 ENCOUNTER — Other Ambulatory Visit: Payer: Self-pay | Admitting: Student

## 2019-05-17 DIAGNOSIS — R918 Other nonspecific abnormal finding of lung field: Secondary | ICD-10-CM

## 2019-05-17 DIAGNOSIS — K7689 Other specified diseases of liver: Secondary | ICD-10-CM

## 2019-05-17 DIAGNOSIS — K760 Fatty (change of) liver, not elsewhere classified: Secondary | ICD-10-CM

## 2019-05-17 DIAGNOSIS — K769 Liver disease, unspecified: Secondary | ICD-10-CM

## 2019-05-17 NOTE — Telephone Encounter (Signed)
Spoke with the pt  He states that he was told by his PCP at his physical a few wks ago that he had some spots on his liver on ct and this may need f/u, but then he never heard anything else from her. I advised that he call their office back regarding this issue. He verbalized understanding and will call today.

## 2019-05-17 NOTE — Telephone Encounter (Signed)
Pt states he had CT done on 03/19/19 and was told he had spots on his lungs and his liver. He was told by DK to follow up w/ his PCP for his liver, he reached out to her but has not heard anything. Would like to speak with nurse to see if there really was something wrong with his liver. Cb#: [phone]

## 2019-05-30 ENCOUNTER — Other Ambulatory Visit: Payer: Self-pay

## 2019-05-30 ENCOUNTER — Ambulatory Visit
Admission: RE | Admit: 2019-05-30 | Discharge: 2019-05-30 | Disposition: A | Discharge status: Home / Self Care | Payer: PPO | Source: Ambulatory Visit | Attending: Student | Admitting: Student

## 2019-05-30 DIAGNOSIS — K7689 Other specified diseases of liver: Secondary | ICD-10-CM | POA: Diagnosis not present

## 2019-05-30 DIAGNOSIS — K760 Fatty (change of) liver, not elsewhere classified: Secondary | ICD-10-CM | POA: Diagnosis not present

## 2019-05-30 DIAGNOSIS — K769 Liver disease, unspecified: Secondary | ICD-10-CM | POA: Diagnosis not present

## 2019-05-30 DIAGNOSIS — R918 Other nonspecific abnormal finding of lung field: Secondary | ICD-10-CM | POA: Diagnosis not present

## 2019-05-30 IMAGING — MR MR ABDOMEN WO/W CM
17 of 18 series · 44 of 48 positions shown · IV contrast (10ml Gadavist)
Comparison: [DATE] chest CT.

CLINICAL DATA: Indeterminate liver lesions on recent chest CT.

EXAM:
MRI ABDOMEN WITHOUT AND WITH CONTRAST
TECHNIQUE: Multiplanar multisequence MR imaging of the abdomen was performed
both before and after the administration of intravenous contrast.
CONTRAST:  10mL GADAVIST GADOBUTROL 1 MMOL/ML IV SOLN

[Series 2: T2 · coronal · 6.5mm · 1.19mm/px · 2 of 35 slices shown (1 of 2)]
[im 1/35]
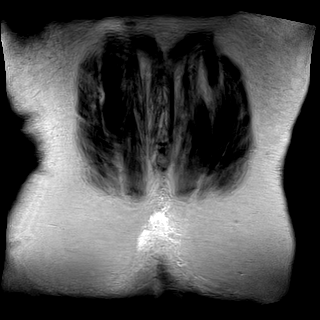
[im 35/35]
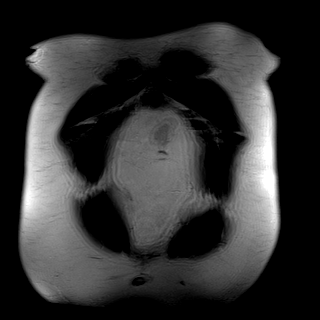

[Series 3: T2 · axial · 6.5mm · 1.19mm/px · z∈[-150,+115]mm · 2 of 35 slices shown (2 of 2)]
[im 1/35]
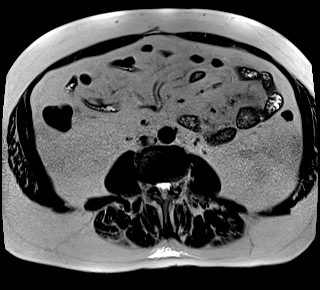
[im 35/35]
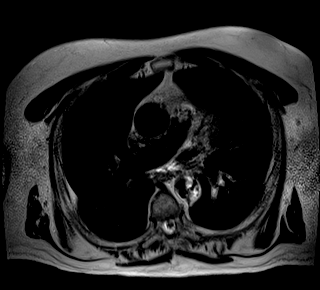

[Series 5: T2 fat-sat · axial · 6.5mm · 1.19mm/px · z∈[-144,+121]mm · 2 of 35 slices shown]
[im 1/35]
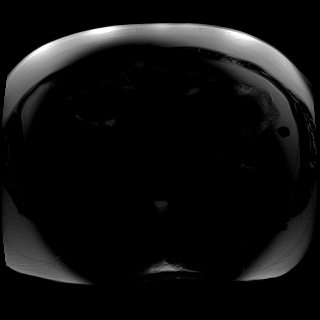
[im 35/35]
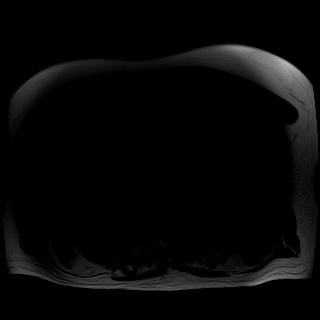

[Series 6: ax dwi_tracew · axial · 6.5mm · 1.42mm/px · z∈[-148,+125]mm · 5 of 108 slices shown]
[im 1/108]
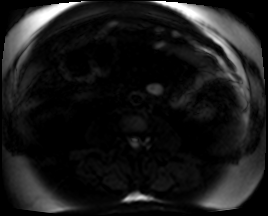
[im 27/108]
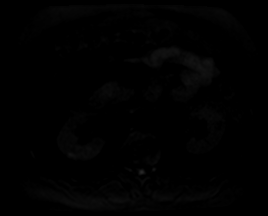
[im 54/108]
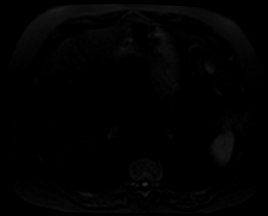
[im 81/108]
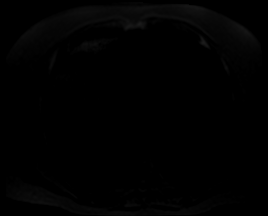
[im 108/108]
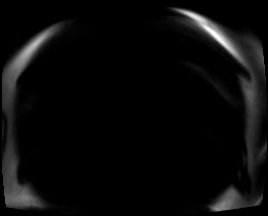

[Series 7: ax dwi_adc · axial · 6.5mm · 1.42mm/px · z∈[-148,+125]mm · 2 of 36 slices shown]
[im 1/36]
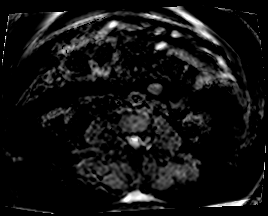
[im 36/36]
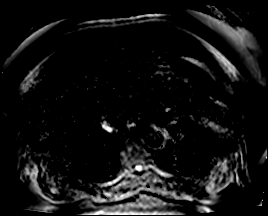

[Series 8: T1 · axial · 6.5mm · 0.74mm/px · z∈[-150,+115]mm · 2 of 35 slices shown (1 of 2)]
[im 1/35]
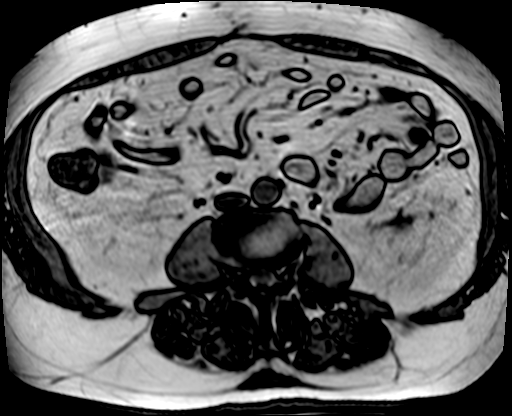
[im 35/35]
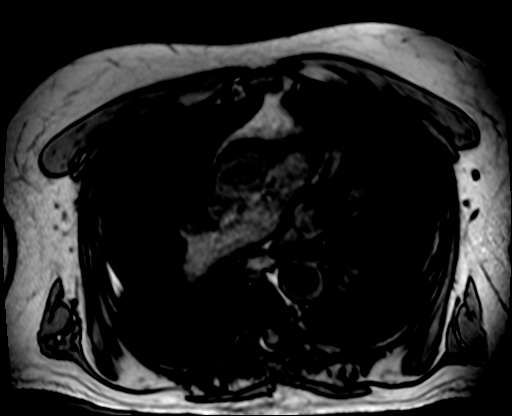

[Series 8: T1 · axial · 6.5mm · 0.74mm/px · z∈[-150,+115]mm · 2 of 35 slices shown (2 of 2)]
[im 1/35]
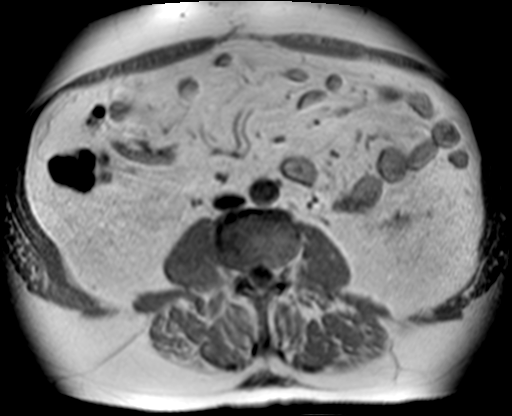
[im 35/35]
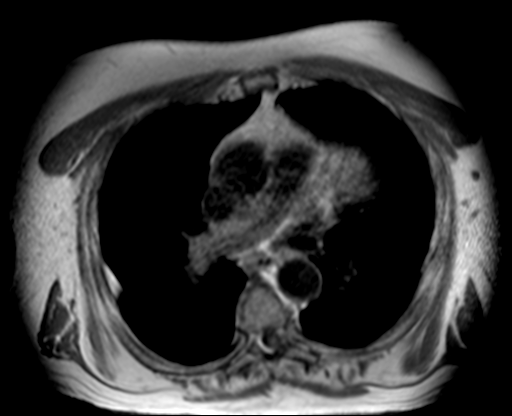

[Series 9: bSSFP · axial · 6.5mm · 0.74mm/px · 1 of 35 slices shown]
[im 1/35]
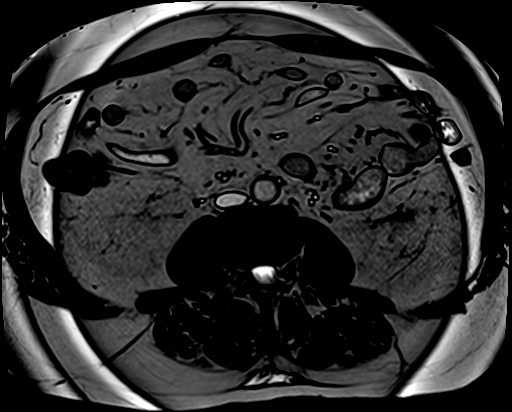

[Series 10: T1 dynamic fat-sat · axial · non-contrast · 3.5mm · 1.19mm/px · z∈[-145,+132]mm · 3 of 80 slices shown (1 of 4)]
[im 1/80]
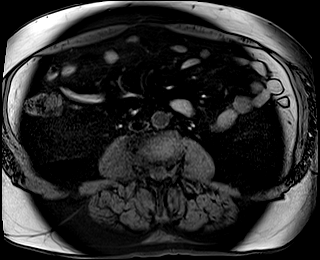
[im 40/80]
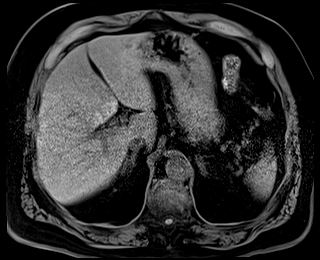
[im 80/80]
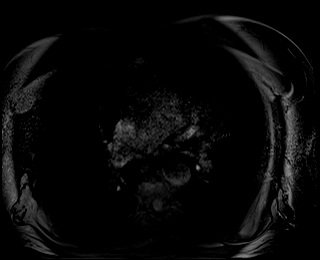

[Series 11: T1 dynamic fat-sat post-contrast · axial · 3.5mm · 1.19mm/px · z∈[-145,+132]mm · 3 of 80 slices shown (1 of 4)]
[im 1/80]
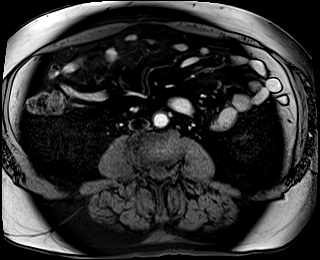
[im 40/80]
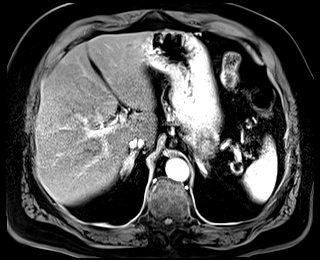
[im 80/80]
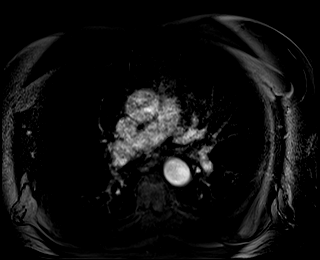

[Series 12: T1 dynamic fat-sat · axial · 3.5mm · 1.19mm/px · z∈[-145,+132]mm · 3 of 80 slices shown (2 of 4)]
[im 1/80]
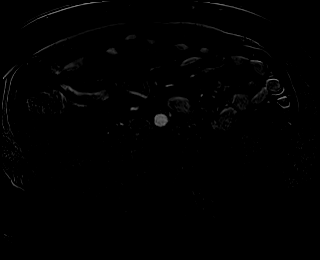
[im 40/80]
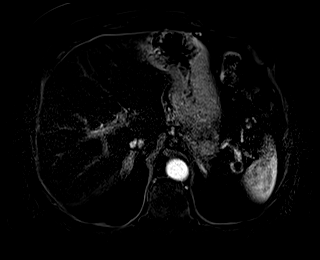
[im 80/80]
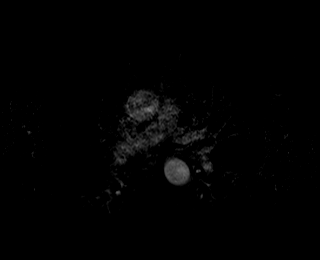

[Series 13: T1 dynamic fat-sat post-contrast · axial · 3.5mm · 1.19mm/px · z∈[-145,+132]mm · 3 of 80 slices shown (2 of 4)]
[im 1/80]
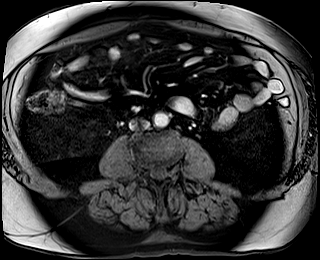
[im 40/80]
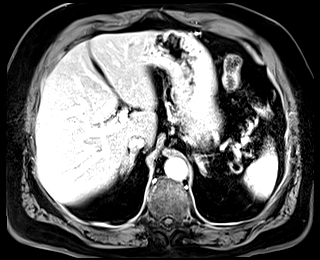
[im 80/80]
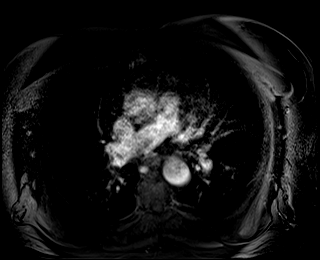

[Series 14: T1 dynamic fat-sat · axial · 3.5mm · 1.19mm/px · z∈[-145,+132]mm · 3 of 80 slices shown (3 of 4)]
[im 1/80]
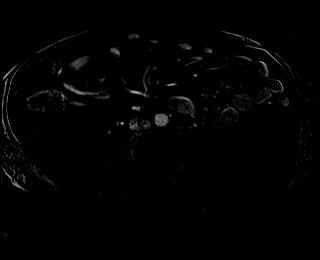
[im 40/80]
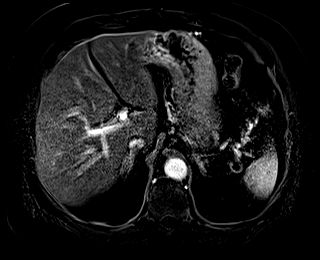
[im 80/80]
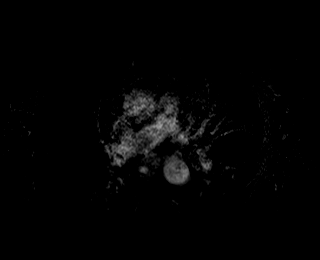

[Series 15: T1 dynamic fat-sat post-contrast · axial · 3.5mm · 1.19mm/px · z∈[-145,+132]mm · 3 of 80 slices shown (3 of 4)]
[im 1/80]
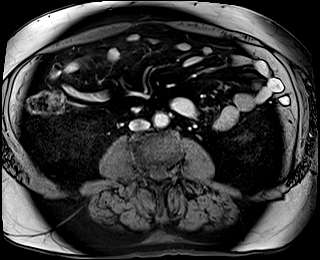
[im 40/80]
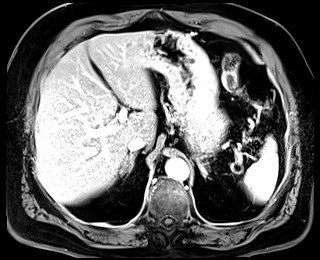
[im 80/80]
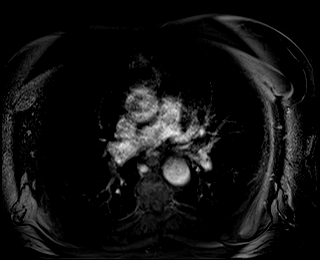

[Series 16: T1 dynamic fat-sat · axial · 3.5mm · 1.19mm/px · z∈[-145,+132]mm · 3 of 80 slices shown (4 of 4)]
[im 1/80]
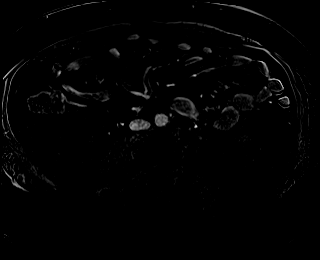
[im 40/80]
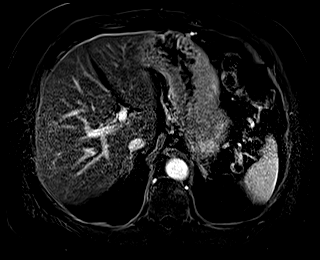
[im 80/80]
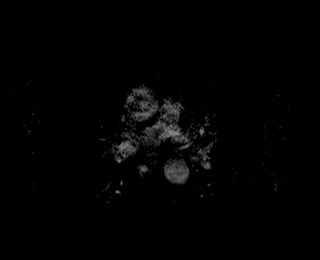

[Series 17: T1 dynamic post-contrast · coronal · 3.5mm · 1.31mm/px · 3 of 80 slices shown]
[im 1/80]
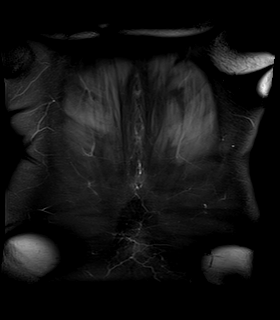
[im 40/80]
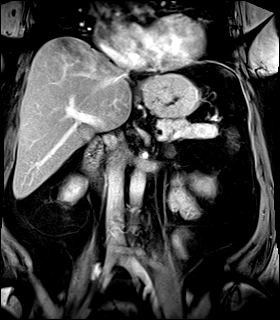
[im 80/80]
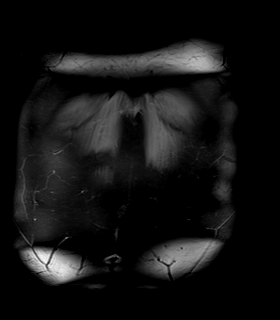

[Series 18: T1 dynamic fat-sat post-contrast · axial · 3.5mm · 1.19mm/px · z∈[-145,-8]mm · 2 of 80 slices shown (4 of 4)]
[im 1/80]
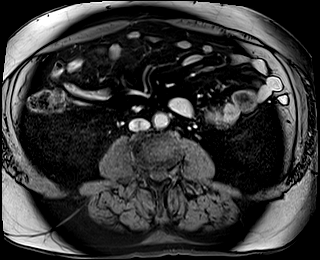
[im 40/80]
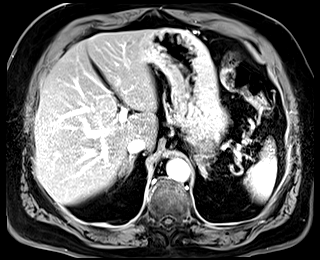

[44 of 48 positions shown; findings below may reference images not displayed]

FINDINGS: Lower chest: A few scattered pulmonary nodules at both lung bases,
largest 5 mm in the peripheral left lower lobe (series 11/image 18)
not appreciably changed since [DATE] chest CT.

Hepatobiliary: Normal liver size and configuration. Moderate diffuse
hepatic steatosis. Numerous (greater than 10) benign-appearing liver
cysts scattered throughout the liver, largest 2.1 cm in segment 3
left liver lobe and 2.0 cm in the segment 7 right liver lobe,
several which demonstrating lobulated outer contour, a few of which
demonstrating thin internal septations, none demonstrating
suspicious features such as thickened enhancing wall or enhancing
mural nodules. Normal gallbladder with no cholelithiasis. No biliary
ductal dilatation. Common bile duct diameter 4 mm. No
choledocholithiasis. No biliary strictures, masses or beading.

Pancreas: A few tiny cystic pancreatic lesions scattered throughout
the pancreas, largest 0.5 cm in anterior inferior pancreatic head
(series 3/image 29), none demonstrating enhancement, wall thickening
or thickened internal septations. No pancreatic duct dilation. No
pancreas divisum.

Spleen: Normal size. No mass.

Adrenals/Urinary Tract: No discrete adrenal nodules. No
hydronephrosis. There are several small T1 hyperintense renal
cortical lesions in both kidneys, largest 2.4 cm in the posterior
interpolar right kidney (series 10/image 59), none of which
demonstrate appreciable solid enhancement on the subtraction
sequences, compatible with Bosniak category 2
hemorrhagic/proteinaceous renal cysts. Additional small simple renal
cortical cysts in both kidneys, largest 1.5 cm in the interpolar
left kidney. No suspicious renal masses.

Stomach/Bowel: Small hiatal hernia. Otherwise normal nondistended
stomach. Visualized small and large bowel is normal caliber, with no
bowel wall thickening.

Vascular/Lymphatic: Normal caliber abdominal aorta. Patent portal,
splenic, hepatic and renal veins. No pathologically enlarged lymph
nodes in the abdomen.

Other: No abdominal ascites or focal fluid collection.

Musculoskeletal: No aggressive appearing focal osseous lesions.
IMPRESSION: 1. Numerous benign-appearing liver and renal cortical cysts. No
suspicious masses.
2. Few tiny nonenhancing cystic pancreatic lesions, largest 0.5 cm
in the pancreatic head, without high risk MRI features. Follow-up
MRI abdomen without and with IV contrast recommended in 2 years.
This recommendation follows ACR consensus guidelines: Management of
Incidental Pancreatic Cysts: A White Paper of the ACR Incidental
Findings Committee. [HOSPITAL] [HB];[DATE].
3. Pulmonary nodules at the lung bases, not appreciably changed
since [DATE] chest CT. Please see the [DATE] chest CT report
for follow-up chest CT recommendations.
4. Moderate diffuse hepatic steatosis.
5. Small hiatal hernia.

## 2019-05-30 MED ORDER — GADOBUTROL 1 MMOL/ML IV SOLN
10.0000 mL | Freq: Once | INTRAVENOUS | Status: AC | PRN
  Administered 2019-05-30: 10 mL via INTRAVENOUS

## 2019-05-31 ENCOUNTER — Telehealth: Payer: Self-pay | Admitting: Internal Medicine

## 2019-05-31 NOTE — Telephone Encounter (Signed)
Spoke with the pt  He is c/o about the o2 at night being inconvenient for him  He states that he has to get up and use the bathroom 3 x per night and it's a pain take this off and on  He wants to get rid of the o2  Do you want to do another ONO? I did not know what else to advise with this situation. Thanks

## 2019-06-01 NOTE — Telephone Encounter (Signed)
Will route to Dr. Mortimer Fries to let him know.  Dr. Clemens Catholic

## 2019-06-01 NOTE — Telephone Encounter (Signed)
If patient does NOT want oxygen, there is high risk for arrhythmias and possible death while asleep. Can discontinue oxygen as per patient request

## 2019-06-01 NOTE — Telephone Encounter (Signed)
Pt called stating that he is just going to continue using O2. States he does not need a call back.   Keeping msg open in case Dr. Mortimer Fries wanted someone to still contact the pt.l

## 2019-06-19 ENCOUNTER — Other Ambulatory Visit: Payer: Self-pay

## 2019-06-19 ENCOUNTER — Ambulatory Visit
Admission: RE | Admit: 2019-06-19 | Discharge: 2019-06-19 | Disposition: A | Discharge status: Home / Self Care | Payer: PPO | Source: Ambulatory Visit | Attending: Internal Medicine | Admitting: Internal Medicine

## 2019-06-19 DIAGNOSIS — R918 Other nonspecific abnormal finding of lung field: Secondary | ICD-10-CM | POA: Diagnosis not present

## 2019-06-19 DIAGNOSIS — R911 Solitary pulmonary nodule: Secondary | ICD-10-CM

## 2019-06-19 IMAGING — CT CT CHEST W/O CM
2 of 4 series · 15 of 36 positions shown, 18 images · non-contrast
Comparison: CT chest dated [DATE]

CLINICAL DATA: Pulmonary nodule follow-up.

EXAM:
CT CHEST WITHOUT CONTRAST
TECHNIQUE: Multidetector CT imaging of the chest was performed following the
standard protocol without IV contrast.

[Series 2: chest 2.00 · axial · 0.75mm/px · z∈[-1316,-1022]mm · 12 of 175 slices shown, 15 images]
[im 14/175  mediastinal]
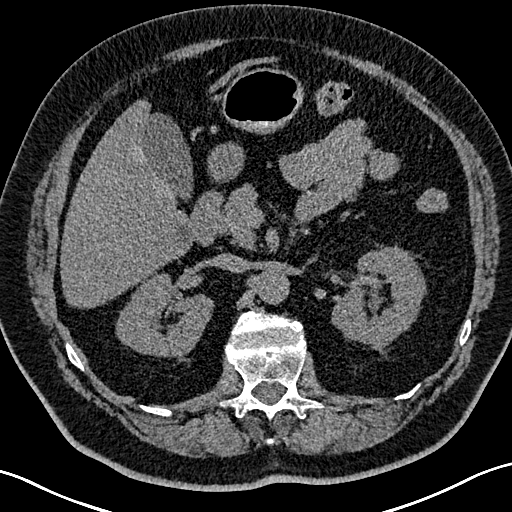
[im 14/175  lung]
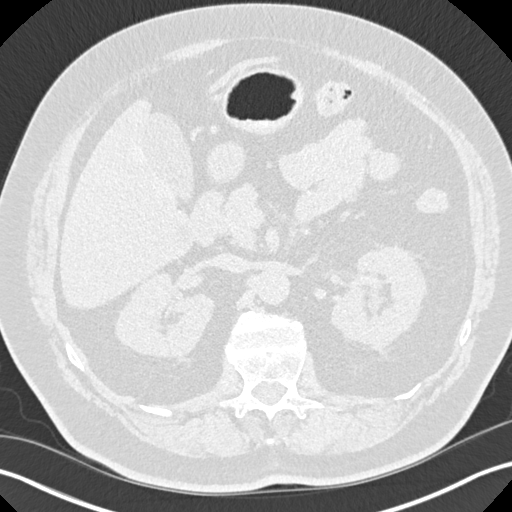
[im 27/175  lung]
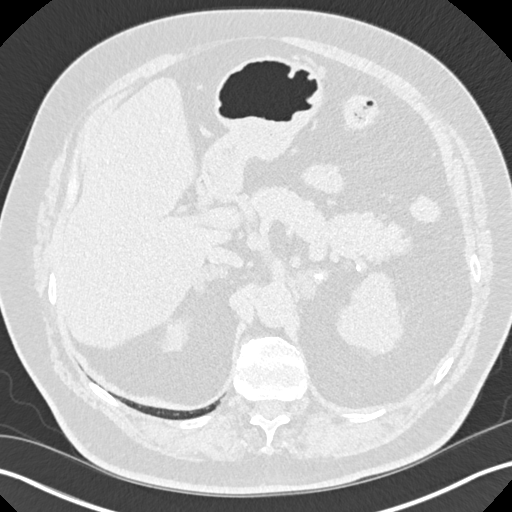
[im 41/175  lung]
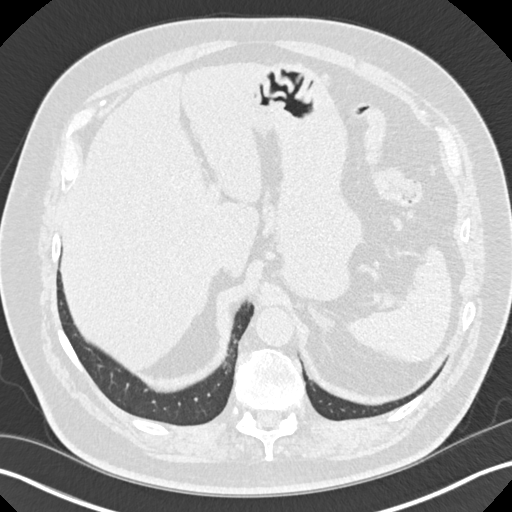
[im 54/175  lung]
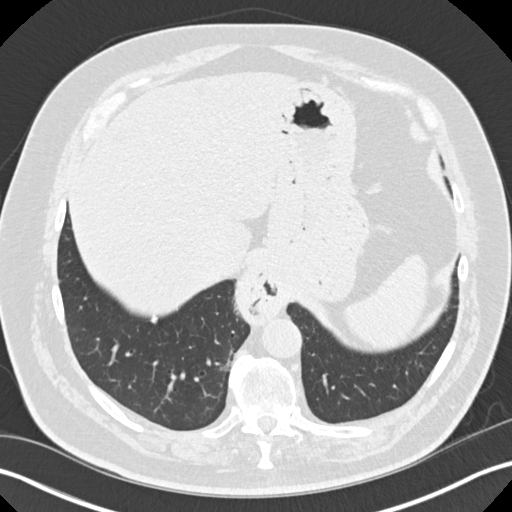
[im 67/175  mediastinal]
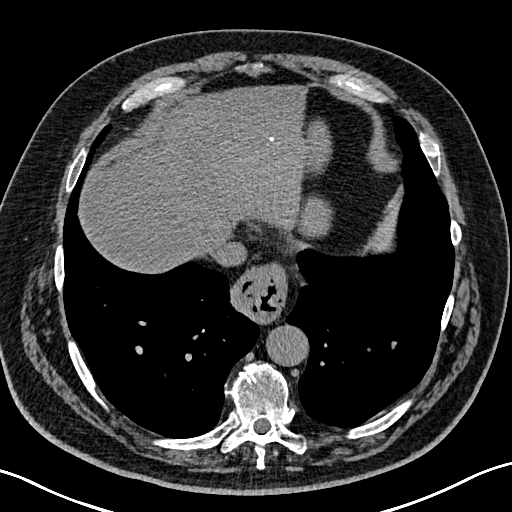
[im 67/175  lung]
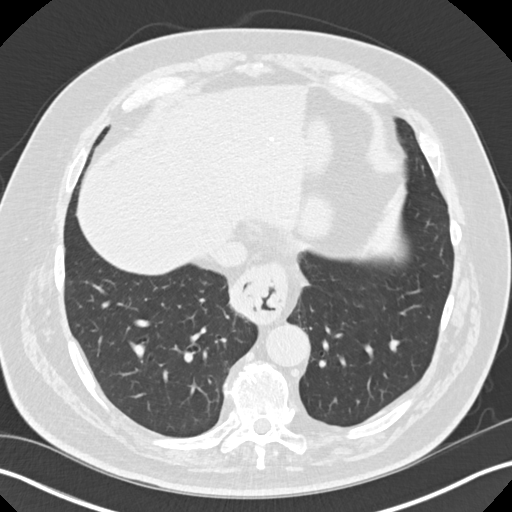
[im 81/175  lung]
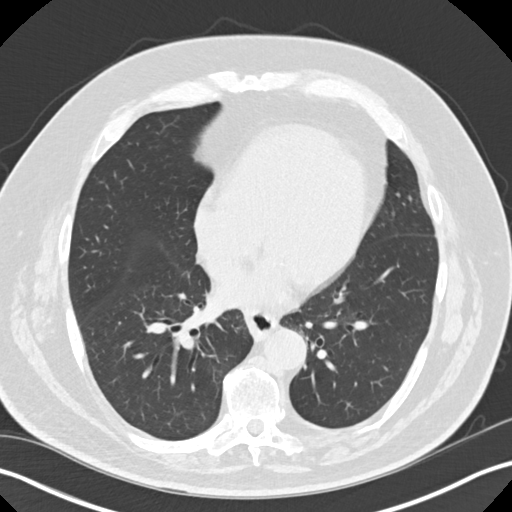
[im 94/175  lung]
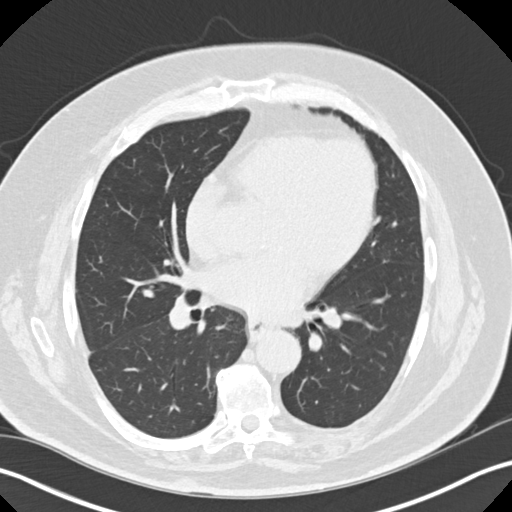
[im 108/175  lung]
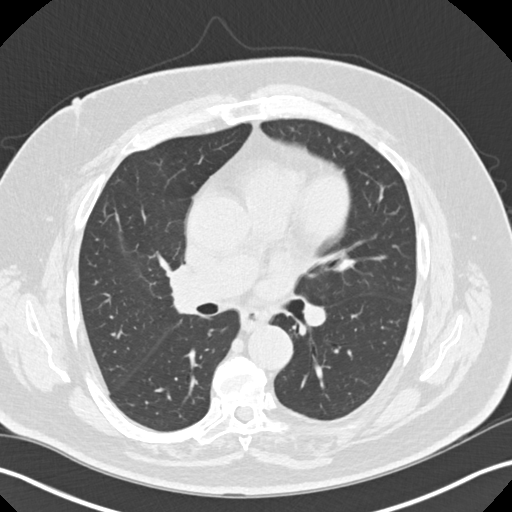
[im 121/175  mediastinal]
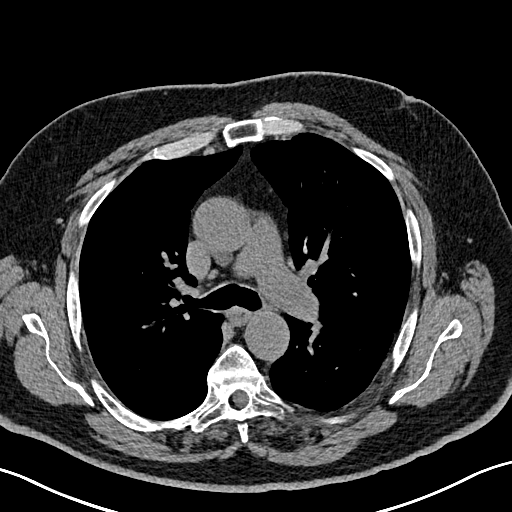
[im 121/175  lung]
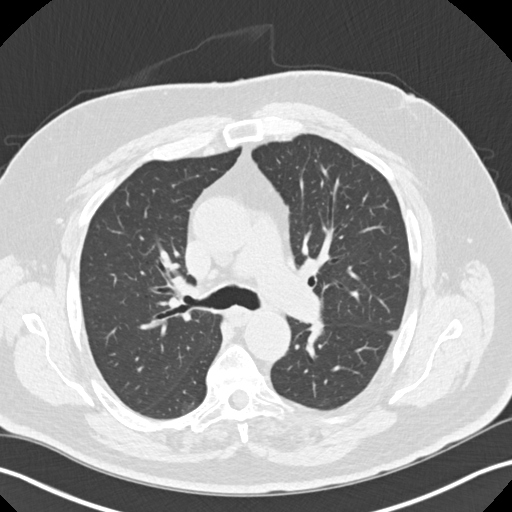
[im 134/175  lung]
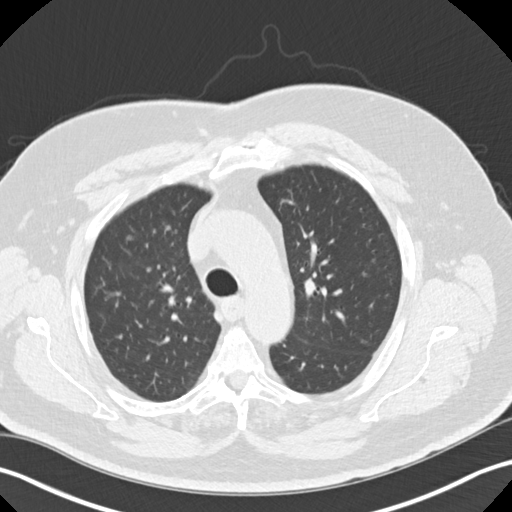
[im 148/175  lung]
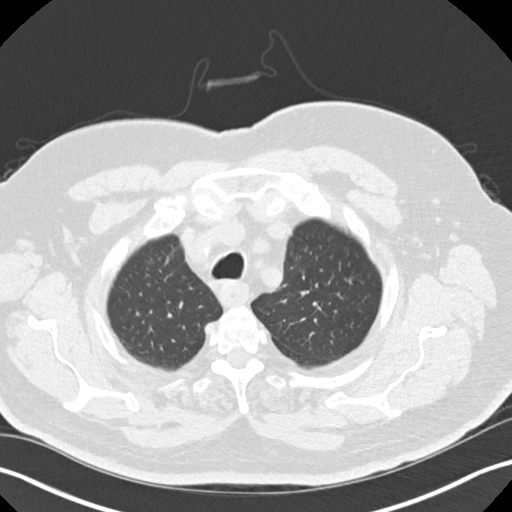
[im 161/175  lung]
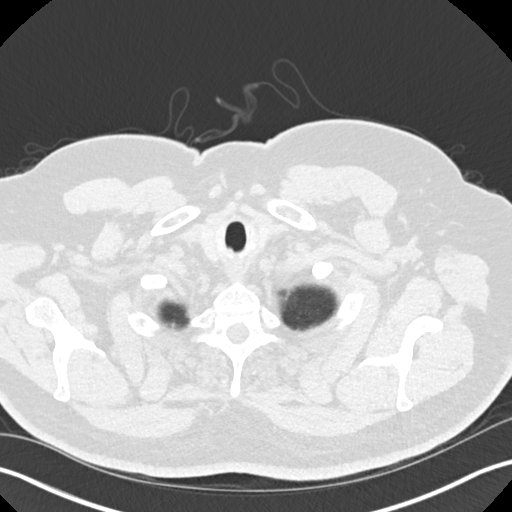

[Series 5: coronals chest 2.00 cor · coronal · 0.68mm/px · 3 of 177 slices shown]
[im 36/177  lung]
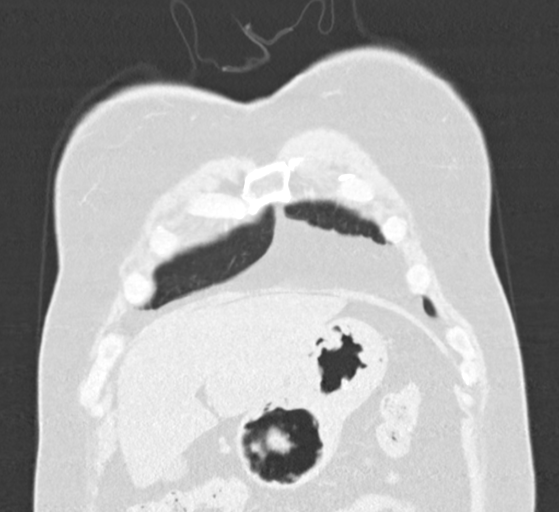
[im 71/177  lung]
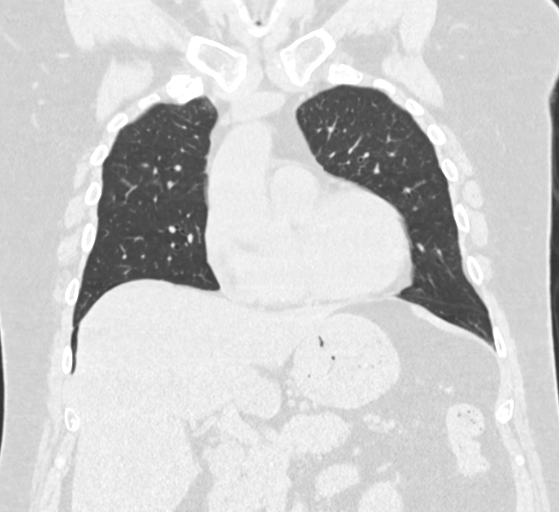
[im 106/177  lung]
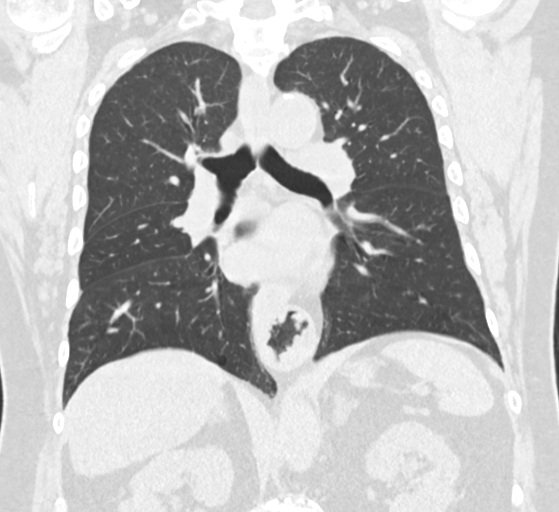

[15 of 36 positions shown; findings below may reference images not displayed]

FINDINGS: Cardiovascular: The heart size is enlarged. Coronary artery
calcifications are noted. Again noted is aneurysmal dilatation of
the ascending aorta measuring approximately 4.3 cm. There is no
significant pericardial effusion.

Mediastinum/Nodes:

--No mediastinal or hilar lymphadenopathy.

--No axillary lymphadenopathy.

--No supraclavicular lymphadenopathy.

--Normal thyroid gland.

--The esophagus is unremarkable

Lungs/Pleura: The previously demonstrated ground-glass airspace
opacity in the posterior right upper lobe appears to have resolved
since the prior study. The 6 mm pulmonary nodule in the left lower
lobe is stable since prior study (axial series 3, image 100). There
is a stable 7 mm pulmonary nodule in the peripheral left lower lobe
(axial series 3, image 106). There are additional scattered stable
pulmonary nodules at the lung bases bilaterally. There is no new
worrisome pulmonary nodule detected on this exam. No focal
infiltrate. No large pleural effusion. The trachea is unremarkable.

Upper Abdomen: Innumerable cysts are noted throughout the liver.
There is underlying hepatic steatosis. Renal cysts are again noted.
There may be a nonobstructing stone in the interpolar region of the
right kidney. There is slight nodularity of both adrenal glands
which is essentially stable from prior study. There is a small
hiatal hernia.

Musculoskeletal: No chest wall abnormality. No acute or significant
osseous findings.
IMPRESSION: 1. Stable bilateral pulmonary nodules as detailed above. A follow-up
CT of the chest is recommended in 15-21 months to confirm ongoing
stability.
2. Relatively stable ascending thoracic aortic aneurysm measuring
approximately 4.3 cm. Recommend annual imaging followup by CTA or
MRA. This recommendation follows [49]
ACCF/AHA/AATS/ACR/ASA/SCA/CARASCO/CARASCO/CARASCO/CARASCO Guidelines for the
Diagnosis and Management of Patients with Thoracic Aortic Disease.
Circulation. [49]; 121: [id number]. Aortic aneurysm NOS ([49]-[49])
3. Hepatic steatosis.

Aortic Atherosclerosis ([49]-[49]).

## 2019-06-25 DIAGNOSIS — I712 Thoracic aortic aneurysm, without rupture: Secondary | ICD-10-CM | POA: Diagnosis not present

## 2019-06-25 DIAGNOSIS — K760 Fatty (change of) liver, not elsewhere classified: Secondary | ICD-10-CM | POA: Diagnosis not present

## 2019-06-25 DIAGNOSIS — K7689 Other specified diseases of liver: Secondary | ICD-10-CM | POA: Diagnosis not present

## 2019-06-25 DIAGNOSIS — K862 Cyst of pancreas: Secondary | ICD-10-CM | POA: Diagnosis not present

## 2019-06-25 DIAGNOSIS — R911 Solitary pulmonary nodule: Secondary | ICD-10-CM | POA: Diagnosis not present

## 2019-06-25 DIAGNOSIS — I700 Atherosclerosis of aorta: Secondary | ICD-10-CM | POA: Diagnosis not present

## 2019-09-14 ENCOUNTER — Telehealth: Payer: Self-pay | Admitting: Internal Medicine

## 2019-09-14 DIAGNOSIS — J449 Chronic obstructive pulmonary disease, unspecified: Secondary | ICD-10-CM

## 2019-09-14 NOTE — Telephone Encounter (Signed)
Called and spoke to pt.  Pt feels that he does not need night time oxygen anymore, as he can not tell a difference when wearing it.  Dr. Mortimer Fries, please advise. Thanks

## 2019-09-17 NOTE — Telephone Encounter (Signed)
Please document advise to patient and proceed with patients wishes to be noncompliant with oxygen therapy and discontinue the oxygen as she requests.

## 2019-09-17 NOTE — Telephone Encounter (Signed)
Pt has been educated on importance of wearing nighttime oxygen. Pt continued to refuse oxygen and requested that it be picked up. Order has been placed to [facility] to do so.  Nothing further is needed at this time.

## 2019-09-17 NOTE — Telephone Encounter (Signed)
3hr 9min 12sec <88%. Lowest spo2 62%.

## 2019-09-17 NOTE — Telephone Encounter (Signed)
Can you remind me of ONO results?

## 2019-09-17 NOTE — Telephone Encounter (Signed)
STRONG RECOMMENDATION TO STAY ON OXYGEN THERAPY HIGH RISK FOR HEART PROBLEMS AND POSSIBLE DEATH IF NONCOMPLIANT WITH OXYGEN THERAPY

## 2019-09-17 NOTE — Telephone Encounter (Signed)
Spoke to and relayed below message.  Pt does not want to proceed with oxygen nor repeat ONO.  Pt would like an order placed to [facility] to d/c oxygen.  Dr. Mortimer Fries, please advise. Thanks

## 2019-09-21 ENCOUNTER — Telehealth: Payer: Self-pay | Admitting: Internal Medicine

## 2019-09-21 NOTE — Telephone Encounter (Signed)
Please proceed with original order 2LNC at night

## 2019-09-21 NOTE — Telephone Encounter (Signed)
Spoke to pt, who would like to continue with nighttime oxygen after doing some research.  Pt has requested that order be placed to [facility] to cancel pickup.  Suanne Marker, can you help with this.

## 2019-09-21 NOTE — Telephone Encounter (Signed)
Noted.  Will route to Dr. Mortimer Fries as an Juluis Rainier, as pt wishes to continue nighttime oxygen.

## 2019-09-21 NOTE — Telephone Encounter (Signed)
Jason Washington, Jason Washington, Jason Washington; Jason Washington; Jason Washington   canceling pick up ticket   D/C order to p/u pt's 02 has been canceled. Pt will be able to keep 02.  Nothing else needed at this time. [name]

## 2019-09-21 NOTE — Telephone Encounter (Signed)
CM sent to Jason Washington and Jason Washington with [facility] to see if the D/C order can be canceled due to patient wanting to keep 02.  Waiting on response. [name]

## 2019-09-21 NOTE — Telephone Encounter (Signed)
Noted. Pt is aware and will continue with 2L [facility]. Nothing further is needed.

## 2019-09-24 ENCOUNTER — Telehealth: Payer: Self-pay | Admitting: Internal Medicine

## 2019-09-24 DIAGNOSIS — J449 Chronic obstructive pulmonary disease, unspecified: Secondary | ICD-10-CM

## 2019-09-24 NOTE — Telephone Encounter (Signed)
Pt states that his night time o2 was reading at 65%, and so he went and bought a pulse ox and it has been reading 93%. See previous telephone encounters. Pt wanted to return his overnight oxygen tank originally, but was told to keep it, but [facility] came today 09/24/19 and picked up his tank. He wasn't sure if he needed to repeat an ONO. He would like a call back at [phone]

## 2019-09-24 NOTE — Telephone Encounter (Signed)
Please see 09/21/2019 phone note.  Patient stated that he purchased a pulse ox and his oxygen levels have been maintaining around 94% at night and during the day. Patient stated that he woke up three times during the night and checked and his oxygen levels were around around 93-94%. Patient would like to repeat ONO.  [facility] picked up his oxygen today. Patient stated that he would not like to continue with oxygen until he has another test that confirms low oxygen levels.   Dr. Mortimer Fries, please advise. Thanks

## 2019-09-25 NOTE — Telephone Encounter (Signed)
Patient is aware of that Dr. Mortimer Fries is unavailable until 10/01/2019. He is okay with waiting on a response.

## 2019-10-01 NOTE — Telephone Encounter (Signed)
Please repeat ONO on RA

## 2019-10-01 NOTE — Telephone Encounter (Signed)
Lm for patient.  

## 2019-10-02 NOTE — Telephone Encounter (Signed)
Lm x2 for pt.  

## 2019-10-03 NOTE — Telephone Encounter (Signed)
ONO has been ordered.  Patient is aware and voiced his understanding. Nothing further is needed at this time.

## 2019-10-08 DIAGNOSIS — G4730 Sleep apnea, unspecified: Secondary | ICD-10-CM | POA: Diagnosis not present

## 2019-10-08 DIAGNOSIS — R0683 Snoring: Secondary | ICD-10-CM | POA: Diagnosis not present

## 2019-10-11 DIAGNOSIS — E119 Type 2 diabetes mellitus without complications: Secondary | ICD-10-CM | POA: Diagnosis not present

## 2019-10-11 DIAGNOSIS — E782 Mixed hyperlipidemia: Secondary | ICD-10-CM | POA: Diagnosis not present

## 2019-10-11 DIAGNOSIS — I10 Essential (primary) hypertension: Secondary | ICD-10-CM | POA: Diagnosis not present

## 2019-10-12 ENCOUNTER — Telehealth: Payer: Self-pay

## 2019-10-12 NOTE — Telephone Encounter (Signed)
Repeat ONO has been reviewed by Dr. Mortimer Fries- recommend 2L [facility].  Lowest spo2 68 %. 2hr 49min 56sec spo2 <1min Patient is aware of results and voiced his understanding.  Patient stated that he would like to think about proceeding with oxygen.  He will call back next week with decision.  Will leave message in triage to ensure f/u.

## 2019-10-15 NOTE — Telephone Encounter (Signed)
Spoke with the pt  He states prefers to schedule appt to discuss this further before ordering o2  Appt scheduled with Derl Barrow for 10/23/19

## 2019-10-17 DIAGNOSIS — R911 Solitary pulmonary nodule: Secondary | ICD-10-CM | POA: Diagnosis not present

## 2019-10-17 DIAGNOSIS — I700 Atherosclerosis of aorta: Secondary | ICD-10-CM | POA: Diagnosis not present

## 2019-10-17 DIAGNOSIS — E119 Type 2 diabetes mellitus without complications: Secondary | ICD-10-CM | POA: Diagnosis not present

## 2019-10-17 DIAGNOSIS — Z8546 Personal history of malignant neoplasm of prostate: Secondary | ICD-10-CM | POA: Diagnosis not present

## 2019-10-17 DIAGNOSIS — K862 Cyst of pancreas: Secondary | ICD-10-CM | POA: Diagnosis not present

## 2019-10-17 DIAGNOSIS — I712 Thoracic aortic aneurysm, without rupture: Secondary | ICD-10-CM | POA: Diagnosis not present

## 2019-10-17 DIAGNOSIS — Z87891 Personal history of nicotine dependence: Secondary | ICD-10-CM | POA: Diagnosis not present

## 2019-10-17 DIAGNOSIS — K219 Gastro-esophageal reflux disease without esophagitis: Secondary | ICD-10-CM | POA: Diagnosis not present

## 2019-10-17 DIAGNOSIS — K760 Fatty (change of) liver, not elsewhere classified: Secondary | ICD-10-CM | POA: Diagnosis not present

## 2019-10-17 DIAGNOSIS — E782 Mixed hyperlipidemia: Secondary | ICD-10-CM | POA: Diagnosis not present

## 2019-10-17 DIAGNOSIS — I10 Essential (primary) hypertension: Secondary | ICD-10-CM | POA: Diagnosis not present

## 2019-10-17 DIAGNOSIS — K7689 Other specified diseases of liver: Secondary | ICD-10-CM | POA: Diagnosis not present

## 2019-10-23 ENCOUNTER — Encounter: Payer: Self-pay | Admitting: Primary Care

## 2019-10-23 ENCOUNTER — Other Ambulatory Visit: Payer: Self-pay

## 2019-10-23 ENCOUNTER — Ambulatory Visit: Payer: PPO | Admitting: Primary Care

## 2019-10-23 VITALS — BP 140/78 | HR 74 | Temp 97.3°F | Ht 71.0 in | Wt 255.0 lb

## 2019-10-23 DIAGNOSIS — G4719 Other hypersomnia: Secondary | ICD-10-CM | POA: Diagnosis not present

## 2019-10-23 DIAGNOSIS — R0681 Apnea, not elsewhere classified: Secondary | ICD-10-CM

## 2019-10-23 DIAGNOSIS — J449 Chronic obstructive pulmonary disease, unspecified: Secondary | ICD-10-CM

## 2019-10-23 DIAGNOSIS — R918 Other nonspecific abnormal finding of lung field: Secondary | ICD-10-CM

## 2019-10-23 DIAGNOSIS — R0602 Shortness of breath: Secondary | ICD-10-CM

## 2019-10-23 DIAGNOSIS — R0600 Dyspnea, unspecified: Secondary | ICD-10-CM

## 2019-10-23 DIAGNOSIS — R0609 Other forms of dyspnea: Secondary | ICD-10-CM

## 2019-10-23 NOTE — Progress Notes (Signed)
@Patient  ID: Jason Washington, male    DOB: Oct 22, 1945, 74 y.o.   MRN: [id number]  Chief Complaint  Patient presents with  . Follow-up    discuss ONO. no current dx.     Referring provider: Wayland Denis, PA-C  HPI: 74 year old male, current everyday smoker (10-pack-year history).  Past medical history significant for COPD.  Patient of Dr. Mortimer Fries, seen for initial consult on 03/07/2019 for abnormal chest x-ray.  Patient has increased symptoms of dyspnea cough over the last year.  Imaging showed right midlung opacification possible cavitary lesion.  Patient was ordered for CT chest to further evaluate.   10/23/2019 Patient presents today to review over night oximetry testing. ONO in August 2021 showed SpO2 low 68 %. He spent 2hr 19min 56sec  <78min. Reviewed by Dr. Mortimer Fries,  ordered for 2L QHS. He returned oxygen to [facility]. Wife reports witnessed apnea, stating that he holds his breath when he sleeps at night multiple times. Falling asleep during the day. [place] 11/24.  He originally had a cough that wouldn't go away. Cough resolved with albuterol use. He report hx sinusitis. CT chest in May 2021 showed stable bilateral pulmonary nodules, previous GGO posterior right upper lobe has resolved. Needs repeat CT chest in 18 month to monitor nodules. He had CT chest with [facility] in 2014 that showed similar nodules 42mm.   He denies shortness of breath or active cough. He has not needed to use albuterol in several weeks. Per his wife he has been more sedentary. He experiences dyspnea with heat/humidity. He has not have pulmonary function testing. He was walking 1.5 miles a day up until last year. Recently stopped walking as much[place] hasn't been able to get out and do as much has he once could d/t conditions. .    No Known Allergies  Immunization History  Administered Date(s) Administered  . Influenza, High Dose Seasonal PF 10/19/2013, 11/07/2014, 11/05/2015,  11/16/2016, 10/25/2018, 09/26/2019  . Influenza-Unspecified 10/29/2010, 11/17/2013  . PFIZER SARS-COV-2 Vaccination 05/07/2019, 05/28/2019  . Pneumococcal Conjugate-13 12/19/2013  . Pneumococcal Polysaccharide-23 03/05/2011  . Tdap 03/07/2009    Past Medical History:  Diagnosis Date  . Chronic GERD   . Genital herpes   . Hypertension   . Prostate CA ([place])     Tobacco History: Social History   Tobacco Use  Smoking Status Current Every Day Smoker  . Packs/day: 1.00  . Years: 10.00  . Pack years: 10.00  . Types: Cigarettes  Smokeless Tobacco Never Used  Tobacco Comment   2 cigs daily- 10/23/2019   Ready to quit: Not Answered Counseling given: Not Answered Comment: 2 cigs daily- 10/23/2019   Outpatient Medications Prior to Visit  Medication Sig Dispense Refill  . acyclovir (ZOVIRAX) 400 MG tablet Take 400 mg by mouth 2 (two) times daily.    Marland Kitchen albuterol (VENTOLIN HFA) 108 (90 Base) MCG/ACT inhaler Inhale 2 puffs into the lungs every 4 (four) hours as needed for wheezing or shortness of breath. 1 g 10  . amLODipine (NORVASC) 5 MG tablet amlodipine 5 mg tablet    . atorvastatin (LIPITOR) 40 MG tablet atorvastatin 40 mg tablet    . etodolac (LODINE) 300 MG capsule etodolac 300 mg capsule    . hydrochlorothiazide (HYDRODIURIL) 25 MG tablet hydrochlorothiazide 25 mg tablet    . lisinopril (ZESTRIL) 40 MG tablet lisinopril 40 mg tablet    . meloxicam (MOBIC) 15 MG tablet meloxicam 15 mg tablet    .  omeprazole (PRILOSEC) 40 MG capsule omeprazole 40 mg capsule,delayed release     No facility-administered medications prior to visit.    Review of Systems  Review of Systems  Constitutional: Negative.   Respiratory: Positive for apnea and shortness of breath. Negative for cough and wheezing.   Psychiatric/Behavioral: Negative.     Physical Exam  BP 140/78 (BP Location: Left Arm, Cuff Size: Normal)   Pulse 74   Temp (!) 97.3 F (36.3 C) (Temporal)   Ht 5\' 11"  (1.803 m)   Wt  255 lb (115.7 kg)   SpO2 98%   BMI 35.57 kg/m  Physical Exam HENT:     Head: Normocephalic and atraumatic.     Mouth/Throat:     Mouth: Mucous membranes are moist.     Pharynx: Oropharynx is clear.  Cardiovascular:     Rate and Rhythm: Normal rate and regular rhythm.     Heart sounds: No murmur heard.   Pulmonary:     Effort: Pulmonary effort is normal.     Breath sounds: Normal breath sounds. No wheezing or rhonchi.  Musculoskeletal:        General: Normal range of motion.     Cervical back: Normal range of motion and neck supple.     Right lower leg: No edema.     Left lower leg: No edema.  Neurological:     General: No focal deficit present.     Mental Status: He is alert and oriented to person, place, and time. Mental status is at baseline.  Psychiatric:        Mood and Affect: Mood normal.        Behavior: Behavior normal.        Thought Content: Thought content normal.        Judgment: Judgment normal.      Lab Results:  CBC No results found for: WBC, RBC, HGB, HCT, PLT, MCV, MCH, MCHC, RDW, LYMPHSABS, MONOABS, EOSABS, BASOSABS  BMET No results found for: NA, K, CL, CO2, GLUCOSE, BUN, CREATININE, CALCIUM, GFRNONAA, GFRAA  BNP No results found for: BNP  ProBNP No results found for: PROBNP  Imaging: No results found.   Assessment & Plan:   Dyspnea on exertion - Current smoker, experiencing dyspnea on exertion and cough. No prior PFTs. Needs pulmonary function testing for evaluation for COPD. No acute findings on CT chest in May, previous GGO resolved.  - FU in 2 months with Dr. Mortimer Fries or sooner if needed  Witnessed episode of apnea - ONO in August 2021 showed SpO2 low 68%. Patient returned oxygen to [facility]. Patients wife reports witness apnea and daytime somnolence. [facility] 11/24 - Needs in-lab split night sleep study  Pulmonary nodules - CT chest in May showed stable bilateral pulmonary nodules, largest 46mm. Previous GGO posterior right upper lobe has  resolved. He had CT chest with [facility] in 2014 that showed similar nodules 62mm. Repeat CT chest in 18 months to ensure stability. Patient does not meet eligibility for lung cancer screening. He has 10 pack year hx. Encourage smoking cessation.     Martyn Ehrich, NP 11/05/2019

## 2019-10-23 NOTE — Patient Instructions (Addendum)
Testing: - CT chest in May showed stable bilateral pulmonary nodules, largest 76mm. Previous GGO posterior right upper lobe has resolved.   Orders: - Pulmonary function testing re: dyspnea - Split night sleep study re: witness apnea, daytime fatigue   Follow-up: - 2 months after testing to review in person with DR. Mortimer Fries or APP

## 2019-11-05 DIAGNOSIS — R0681 Apnea, not elsewhere classified: Secondary | ICD-10-CM | POA: Insufficient documentation

## 2019-11-05 DIAGNOSIS — R0609 Other forms of dyspnea: Secondary | ICD-10-CM | POA: Insufficient documentation

## 2019-11-05 DIAGNOSIS — R918 Other nonspecific abnormal finding of lung field: Secondary | ICD-10-CM | POA: Insufficient documentation

## 2019-11-05 NOTE — Assessment & Plan Note (Addendum)
-   CT chest in May showed stable bilateral pulmonary nodules, largest 63mm. Previous GGO posterior right upper lobe has resolved. He had CT chest with [facility] in 2014 that showed similar nodules 31mm. Repeat CT chest in 18 months to ensure stability. Patient does not meet eligibility for lung cancer screening. He has 10 pack year hx. Encourage smoking cessation.

## 2019-11-05 NOTE — Assessment & Plan Note (Addendum)
-   ONO in August 2021 showed SpO2 low 68%. Patient returned oxygen to [facility]. Patients wife reports witness apnea and daytime somnolence. [place] 11/24 - Needs in-lab split night sleep study

## 2019-11-05 NOTE — Assessment & Plan Note (Addendum)
-   Current smoker, experiencing dyspnea on exertion and cough. No prior PFTs. Needs pulmonary function testing for evaluation for COPD. No acute findings on CT chest in May, previous GGO resolved.  - FU in 2 months with Dr. Mortimer Fries or sooner if needed

## 2019-11-21 ENCOUNTER — Encounter: Payer: Self-pay | Admitting: Dermatology

## 2019-11-21 ENCOUNTER — Ambulatory Visit: Payer: PPO | Admitting: Dermatology

## 2019-11-21 ENCOUNTER — Other Ambulatory Visit: Payer: Self-pay

## 2019-11-21 DIAGNOSIS — L570 Actinic keratosis: Secondary | ICD-10-CM | POA: Diagnosis not present

## 2019-11-21 DIAGNOSIS — L821 Other seborrheic keratosis: Secondary | ICD-10-CM

## 2019-11-21 DIAGNOSIS — L820 Inflamed seborrheic keratosis: Secondary | ICD-10-CM | POA: Diagnosis not present

## 2019-11-21 DIAGNOSIS — C44519 Basal cell carcinoma of skin of other part of trunk: Secondary | ICD-10-CM

## 2019-11-21 DIAGNOSIS — Z85820 Personal history of malignant melanoma of skin: Secondary | ICD-10-CM

## 2019-11-21 DIAGNOSIS — Z1283 Encounter for screening for malignant neoplasm of skin: Secondary | ICD-10-CM | POA: Diagnosis not present

## 2019-11-21 DIAGNOSIS — C4491 Basal cell carcinoma of skin, unspecified: Secondary | ICD-10-CM

## 2019-11-21 DIAGNOSIS — D492 Neoplasm of unspecified behavior of bone, soft tissue, and skin: Secondary | ICD-10-CM

## 2019-11-21 DIAGNOSIS — L578 Other skin changes due to chronic exposure to nonionizing radiation: Secondary | ICD-10-CM

## 2019-11-21 HISTORY — DX: Basal cell carcinoma of skin, unspecified: C44.91

## 2019-11-21 NOTE — Patient Instructions (Signed)

## 2019-11-21 NOTE — Progress Notes (Signed)
New Patient Visit  Subjective  Jason Washington is a 74 y.o. male who presents for the following: Annual Exam (Pt presents for UBSE, Hx of Melanoma on the R knee removed several years ago ). Pt c/o several irritated itchy spots on the scalp.  The patient presents for Upper Body Skin Exam (UBSE) for skin cancer screening and mole check.  The following portions of the chart were reviewed this encounter and updated as appropriate:  Tobacco  Allergies  Meds  Problems  Med Hx  Surg Hx  Fam Hx     Review of Systems:  No other skin or systemic complaints except as noted in HPI or Assessment and Plan.  Objective  Well appearing patient in no apparent distress; mood and affect are within normal limits.  All skin waist up examined.  Objective  Right superior lateral pectoral: 1.1 cm pearly crusted papule   Objective  Right Knee - Anterior: Well healed scar with no evidence of recurrence, no lymphadenopathy.   Objective  Scalp (6): Erythematous keratotic or waxy stuck-on papule or plaque.   Objective  scalp, R ear (20): Erythematous thin papules/macules with gritty scale.    Assessment & Plan  Neoplasm of skin Right superior lateral pectoral  Epidermal / dermal shaving  Lesion diameter (cm):  1.1 Informed consent: discussed and consent obtained   Timeout: patient name, date of birth, surgical site, and procedure verified   Procedure prep:  Patient was prepped and draped in usual sterile fashion Prep type:  Isopropyl alcohol Anesthesia: the lesion was anesthetized in a standard fashion   Anesthetic:  1% lidocaine w/ epinephrine 1-100,000 buffered w/ 8.4% NaHCO3 Hemostasis achieved with: pressure, aluminum chloride and electrodesiccation   Outcome: patient tolerated procedure well   Post-procedure details: sterile dressing applied and wound care instructions given   Dressing type: bandage and petrolatum    Destruction of lesion Complexity: extensive     Destruction method: electrodesiccation and curettage   Informed consent: discussed and consent obtained   Timeout:  patient name, date of birth, surgical site, and procedure verified Procedure prep:  Patient was prepped and draped in usual sterile fashion Prep type:  Isopropyl alcohol Anesthesia: the lesion was anesthetized in a standard fashion   Anesthetic:  1% lidocaine w/ epinephrine 1-100,000 buffered w/ 8.4% NaHCO3 Curettage performed in three different directions: Yes   Electrodesiccation performed over the curetted area: Yes   Lesion length (cm):  1.1 Lesion width (cm):  1.1 Margin per side (cm):  0.5 Final wound size (cm):  2.1 Hemostasis achieved with:  pressure, aluminum chloride and electrodesiccation Outcome: patient tolerated procedure well with no complications   Post-procedure details: sterile dressing applied and wound care instructions given   Dressing type: bandage and petrolatum    Specimen 1 - Surgical pathology Differential Diagnosis: R/O BCC Check Margins: No 1.1 cm pearly crusted papule  History of melanoma Right Knee - Anterior Clear.  No lymphadenopathy. observe for recurrence. Call clinic for new or changing lesions.  Recommend regular skin exams, daily broad-spectrum spf 30+ sunscreen use, and photoprotection.     Inflamed seborrheic keratosis (6) Scalp  Destruction of lesion - Scalp Complexity: simple   Destruction method: cryotherapy   Informed consent: discussed and consent obtained   Timeout:  patient name, date of birth, surgical site, and procedure verified Lesion destroyed using liquid nitrogen: Yes   Region frozen until ice ball extended beyond lesion: Yes   Outcome: patient tolerated procedure well with no complications  Post-procedure details: wound care instructions given    AK (actinic keratosis) (20) scalp, R ear  Destruction of lesion - scalp, R ear Complexity: simple   Destruction method: cryotherapy   Informed consent:  discussed and consent obtained   Timeout:  patient name, date of birth, surgical site, and procedure verified Lesion destroyed using liquid nitrogen: Yes   Region frozen until ice ball extended beyond lesion: Yes   Outcome: patient tolerated procedure well with no complications   Post-procedure details: wound care instructions given    Actinic Damage - diffuse scaly erythematous macules with underlying dyspigmentation - Recommend daily broad spectrum sunscreen SPF 30+ to sun-exposed areas, reapply every 2 hours as needed.  - Call for new or changing lesions.  Seborrheic Keratoses - Stuck-on, waxy, tan-brown papules and plaques  - Discussed benign etiology and prognosis. - Observe - Call for any changes  Return in about 6 weeks (around 01/02/2020) for check scalp and ear .  IMarye Round, CMA, am acting as scribe for Sarina Ser, MD .  Documentation: I have reviewed the above documentation for accuracy and completeness, and I agree with the above.  Sarina Ser, MD

## 2019-11-22 ENCOUNTER — Encounter: Payer: Self-pay | Admitting: Dermatology

## 2019-11-26 ENCOUNTER — Telehealth: Payer: Self-pay

## 2019-11-26 NOTE — Telephone Encounter (Signed)
Patient advised biopsy showed BCC, already treated. Will recheck at follow up next month, JS

## 2019-11-26 NOTE — Telephone Encounter (Signed)
-----   Message from Ralene Bathe, MD sent at 11/23/2019  5:17 PM EDT ----- Skin , right superior lateral pectoral BASAL CELL CARCINOMA, NODULAR PATTERN  Cancer - BCC Already treated Recheck next visit

## 2019-11-27 ENCOUNTER — Ambulatory Visit: Payer: PPO | Attending: Pulmonary Disease

## 2019-11-27 DIAGNOSIS — G4710 Hypersomnia, unspecified: Secondary | ICD-10-CM | POA: Diagnosis not present

## 2019-11-27 DIAGNOSIS — Z6835 Body mass index (BMI) 35.0-35.9, adult: Secondary | ICD-10-CM | POA: Diagnosis not present

## 2019-11-27 DIAGNOSIS — Z9981 Dependence on supplemental oxygen: Secondary | ICD-10-CM | POA: Insufficient documentation

## 2019-11-27 DIAGNOSIS — R400 Somnolence: Secondary | ICD-10-CM | POA: Insufficient documentation

## 2019-11-28 ENCOUNTER — Other Ambulatory Visit: Payer: Self-pay

## 2019-11-30 ENCOUNTER — Telehealth (INDEPENDENT_AMBULATORY_CARE_PROVIDER_SITE_OTHER): Payer: PPO | Admitting: Pulmonary Disease

## 2019-11-30 DIAGNOSIS — G4733 Obstructive sleep apnea (adult) (pediatric): Secondary | ICD-10-CM

## 2019-11-30 NOTE — Telephone Encounter (Signed)
Severe OSA Ordered as NPSG SUggest CPAP titration study since severe hypoxia

## 2019-12-03 DIAGNOSIS — G4733 Obstructive sleep apnea (adult) (pediatric): Secondary | ICD-10-CM | POA: Diagnosis not present

## 2019-12-03 NOTE — Telephone Encounter (Signed)
Patient is aware results and voiced his understanding.  Order has been placed for cpap titration.  Nothing further needed.

## 2019-12-03 NOTE — Telephone Encounter (Signed)
Sleep study showed severe OSA- patient needs CPAP titration study to determine best pressure settings and if he needs oxygen.

## 2019-12-12 ENCOUNTER — Telehealth: Payer: Self-pay | Admitting: Primary Care

## 2019-12-12 NOTE — Telephone Encounter (Signed)
Spoke to patient and explained reasoning for cpap titration. Patient is aware that he will not need appointment with [facility] prior to sleep study. Patient is requesting appt prior to titration.  Phone visit has been scheduled for 12/18/2019. Okay for phone visit per [facility]. Nothing further needed.

## 2019-12-18 ENCOUNTER — Other Ambulatory Visit: Payer: Self-pay

## 2019-12-18 ENCOUNTER — Ambulatory Visit (INDEPENDENT_AMBULATORY_CARE_PROVIDER_SITE_OTHER): Payer: PPO | Admitting: Primary Care

## 2019-12-18 ENCOUNTER — Encounter: Payer: Self-pay | Admitting: Primary Care

## 2019-12-18 DIAGNOSIS — G4730 Sleep apnea, unspecified: Secondary | ICD-10-CM

## 2019-12-18 NOTE — Patient Instructions (Signed)
-   Sleep study showed severe obstructive sleep apnea with oxygen desaturation to 74% - Scheduled for CPAP titration study - We will follow-up after CPAP titration study; you will need visit 31-90 days after starting CPAP therapy

## 2019-12-18 NOTE — Progress Notes (Signed)
Virtual Visit via Telephone Note  I connected with Jason Washington on 12/18/19 at  3:00 PM EDT by telephone and verified that I am speaking with the correct person using two identifiers.  Location: Patient: Home Provider: Office   I discussed the limitations, risks, security and privacy concerns of performing an evaluation and management service by telephone and the availability of in person appointments. I also discussed with the patient that there may be a patient responsible charge related to this service. The patient expressed understanding and agreed to proceed.  History of Present Illness: 74 year old male, current everyday smoker (10-pack-year history).  Past medical history significant for COPD.  Patient of Dr. Mortimer Fries, seen for initial consult on 03/07/2019 for abnormal chest x-ray.  Patient has increased symptoms of dyspnea cough over the last year.  Imaging showed right midlung opacification possible cavitary lesion.  Patient was ordered for CT chest to further evaluate.   Previous [facility] pulmonary encounter: 10/23/2019 Patient presents today to review over night oximetry testing. ONO in August 2021 showed SpO2 low 68 %. He spent 2hr 24min 56sec  <68min. Reviewed by Dr. Mortimer Fries,  ordered for 2L QHS. He returned oxygen to [facility]. Wife reports witnessed apnea, stating that he holds his breath when he sleeps at night multiple times. Falling asleep during the day. [place] 11/24.  He originally had a cough that wouldn't go away. Cough resolved with albuterol use. He report hx sinusitis. CT chest in May 2021 showed stable bilateral pulmonary nodules, previous GGO posterior right upper lobe has resolved. Needs repeat CT chest in 18 month to monitor nodules. He had CT chest with [facility] in 2014 that showed similar nodules 32mm.   He denies shortness of breath or active cough. He has not needed to use albuterol in several weeks. Per his wife he has been more sedentary. He experiences dyspnea with  heat/humidity. He has not have pulmonary function testing. He was walking 1.5 miles a day up until last year. Recently stopped walking as much[place] hasn't been able to get out and do as much has he once could d/t conditions. .   12/18/2019- Interim hx Patient contacted today to review sleep study. Patient had polycomnography on 11/28/19 at [facility] which showed severe obstructive sleep apnea, AHI 96.9 with SpO2 low 74%. We reviewed sleep study results and recommendations today. He is scheduled for CPAP titration study next week. No acute complaints today. No further needs.    Observations/Objective:  - Able to speak in full sentences; No overt shortness of breath, wheezing or cough   Assessment and Plan:  Severe OSA: - ONO in August 2021 showed oxygen desaturation to 68%. Patient reports witnessed apnea and daytime somnolence.  - PSG 11/28/19 showed severe obstructive sleep apnea, SpO2 low 74% - Discussed sleep study results and patient agreeing to recommended CPAP titration study which is scheduled for next week - Continue to encourage healthy weight loss, physical exercise, avoiding sedating medication/or alcohol in excess prior to bedtime and driving if somnolent   Follow Up Instructions:   - We will follow-up after CPAP titration study; he will need FU 31-90 days after starting CPAP therapy  I discussed the assessment and treatment plan with the patient. The patient was provided an opportunity to ask questions and all were answered. The patient agreed with the plan and demonstrated an understanding of the instructions.   The patient was advised to call back or seek an in-person evaluation  if the symptoms worsen or if the condition fails to improve as anticipated.  I provided 18 minutes of non-face-to-face time during this encounter.   Martyn Ehrich, NP

## 2019-12-26 ENCOUNTER — Ambulatory Visit: Payer: PPO | Attending: Pulmonary Disease

## 2019-12-26 DIAGNOSIS — R400 Somnolence: Secondary | ICD-10-CM | POA: Diagnosis not present

## 2019-12-26 DIAGNOSIS — G4733 Obstructive sleep apnea (adult) (pediatric): Secondary | ICD-10-CM | POA: Insufficient documentation

## 2019-12-26 DIAGNOSIS — Z9981 Dependence on supplemental oxygen: Secondary | ICD-10-CM | POA: Diagnosis not present

## 2019-12-26 DIAGNOSIS — Z6834 Body mass index (BMI) 34.0-34.9, adult: Secondary | ICD-10-CM | POA: Insufficient documentation

## 2019-12-26 DIAGNOSIS — E669 Obesity, unspecified: Secondary | ICD-10-CM | POA: Diagnosis not present

## 2019-12-27 ENCOUNTER — Other Ambulatory Visit: Payer: Self-pay

## 2020-01-02 ENCOUNTER — Telehealth (INDEPENDENT_AMBULATORY_CARE_PROVIDER_SITE_OTHER): Payer: PPO | Admitting: Pulmonary Disease

## 2020-01-02 DIAGNOSIS — G4733 Obstructive sleep apnea (adult) (pediatric): Secondary | ICD-10-CM

## 2020-01-02 NOTE — Telephone Encounter (Signed)
Jason Washington can you please let patient know CPAP titration study showed that he needs to wear BIPAP. Can you place an order for auto BIPAP with medium full face mask. Pressure setting- Epap min 11cm h20; IPAP max 20cm h20, Pressure Support 4. Needs follow-up in 6-8 weeks for compliance check.

## 2020-01-02 NOTE — Telephone Encounter (Signed)
  Suggest auto BIPAP with med FF mask EPAP min 11, IPAP max20, PS +4 cm

## 2020-01-03 ENCOUNTER — Encounter: Payer: Self-pay | Admitting: Dermatology

## 2020-01-03 ENCOUNTER — Ambulatory Visit: Payer: PPO | Admitting: Dermatology

## 2020-01-03 ENCOUNTER — Other Ambulatory Visit: Payer: Self-pay

## 2020-01-03 DIAGNOSIS — D1800 Hemangioma unspecified site: Secondary | ICD-10-CM

## 2020-01-03 DIAGNOSIS — D229 Melanocytic nevi, unspecified: Secondary | ICD-10-CM | POA: Diagnosis not present

## 2020-01-03 DIAGNOSIS — L814 Other melanin hyperpigmentation: Secondary | ICD-10-CM | POA: Diagnosis not present

## 2020-01-03 DIAGNOSIS — L720 Epidermal cyst: Secondary | ICD-10-CM | POA: Diagnosis not present

## 2020-01-03 DIAGNOSIS — Z85828 Personal history of other malignant neoplasm of skin: Secondary | ICD-10-CM

## 2020-01-03 DIAGNOSIS — L570 Actinic keratosis: Secondary | ICD-10-CM

## 2020-01-03 DIAGNOSIS — L820 Inflamed seborrheic keratosis: Secondary | ICD-10-CM

## 2020-01-03 DIAGNOSIS — L821 Other seborrheic keratosis: Secondary | ICD-10-CM | POA: Diagnosis not present

## 2020-01-03 DIAGNOSIS — L578 Other skin changes due to chronic exposure to nonionizing radiation: Secondary | ICD-10-CM | POA: Diagnosis not present

## 2020-01-03 DIAGNOSIS — Z1283 Encounter for screening for malignant neoplasm of skin: Secondary | ICD-10-CM

## 2020-01-03 NOTE — Telephone Encounter (Signed)
Called and spoke with pt letting him know the results of the titration study and that recommendations was for him to be started on BIPAP. Pt verbalized understanding. Order for BIPAP start has been placed and F/U appt has been scheduled. Nothing further needed.

## 2020-01-03 NOTE — Progress Notes (Signed)
Follow-Up Visit   Subjective  Jason Washington is a 74 y.o. male who presents for the following: Actinic Keratosis (face and scalp - check for any new or persistent skin lesions). Other irritated skin lesions that are scattered over the trunk and extremities that the patient would like treated.  The patient presents for Upper Body Skin Exam (UBSE) for skin cancer screening and mole check.  The following portions of the chart were reviewed this encounter and updated as appropriate:  Tobacco  Allergies  Meds  Problems  Med Hx  Surg Hx  Fam Hx     Review of Systems:  No other skin or systemic complaints except as noted in HPI or Assessment and Plan.  Objective  Well appearing patient in no apparent distress; mood and affect are within normal limits.  All skin waist up examined.  Objective  right superior lat pectoral: Well healed scar with no evidence of recurrence.   Objective  trunk, scalp, face x 21 (21): Erythematous keratotic or waxy stuck-on papule or plaque.   Objective  Scalp x 5 (5): Erythematous thin papules/macules with gritty scale.   Objective  Right Upper Eyelid: Firm SQ nodule   Assessment & Plan  History of basal cell carcinoma (BCC) right superior lat pectoral  Clear. Observe for recurrence. Call clinic for new or changing lesions.  Recommend regular skin exams, daily broad-spectrum spf 30+ sunscreen use, and photoprotection.     Inflamed seborrheic keratosis (21) trunk, scalp, face x 21  Destruction of lesion - trunk, scalp, face x 21 Complexity: simple   Destruction method: cryotherapy   Informed consent: discussed and consent obtained   Timeout:  patient name, date of birth, surgical site, and procedure verified Lesion destroyed using liquid nitrogen: Yes   Region frozen until ice ball extended beyond lesion: Yes   Outcome: patient tolerated procedure well with no complications   Post-procedure details: wound care instructions given      AK (actinic keratosis) (5) Scalp x 5  Destruction of lesion - Scalp x 5 Complexity: simple   Destruction method: cryotherapy   Informed consent: discussed and consent obtained   Timeout:  patient name, date of birth, surgical site, and procedure verified Lesion destroyed using liquid nitrogen: Yes   Region frozen until ice ball extended beyond lesion: Yes   Outcome: patient tolerated procedure well with no complications   Post-procedure details: wound care instructions given    Epidermal inclusion cyst Right Upper Eyelid  Benign appearing, observe.   Skin cancer screening  Lentigines - Scattered tan macules - Discussed due to sun exposure - Benign, observe - Call for any changes  Seborrheic Keratoses - Stuck-on, waxy, tan-brown papules and plaques  - Discussed benign etiology and prognosis. - Observe - Call for any changes  Melanocytic Nevi - Tan-brown and/or pink-flesh-colored symmetric macules and papules - Benign appearing on exam today - Observation - Call clinic for new or changing moles - Recommend daily use of broad spectrum spf 30+ sunscreen to sun-exposed areas.   Hemangiomas - Red papules - Discussed benign nature - Observe - Call for any changes  Actinic Damage - Chronic, secondary to cumulative UV/sun exposure - diffuse scaly erythematous macules with underlying dyspigmentation - Recommend daily broad spectrum sunscreen SPF 30+ to sun-exposed areas, reapply every 2 hours as needed.  - Call for new or changing lesions.  Skin cancer screening performed today.  Return in about 6 months (around 07/02/2020).  Luther Redo, CMA, am acting  as scribe for Sarina Ser, MD .  Documentation: I have reviewed the above documentation for accuracy and completeness, and I agree with the above.  Sarina Ser, MD

## 2020-01-03 NOTE — Addendum Note (Signed)
Addended by: Lorretta Harp on: 01/03/2020 09:18 AM   Modules accepted: Orders

## 2020-01-08 ENCOUNTER — Encounter: Payer: Self-pay | Admitting: Dermatology

## 2020-01-09 DIAGNOSIS — R059 Cough, unspecified: Secondary | ICD-10-CM | POA: Diagnosis not present

## 2020-01-09 DIAGNOSIS — U071 COVID-19: Secondary | ICD-10-CM | POA: Diagnosis not present

## 2020-01-09 DIAGNOSIS — R509 Fever, unspecified: Secondary | ICD-10-CM | POA: Diagnosis not present

## 2020-02-22 ENCOUNTER — Telehealth: Payer: Self-pay

## 2020-02-22 NOTE — Telephone Encounter (Signed)
Patient is aware of date/time of covid test prior to PFT.  

## 2020-02-25 ENCOUNTER — Telehealth: Payer: Self-pay | Admitting: Primary Care

## 2020-02-25 ENCOUNTER — Other Ambulatory Visit: Admission: RE | Admit: 2020-02-25 | Payer: PPO | Source: Ambulatory Visit

## 2020-02-25 NOTE — Telephone Encounter (Signed)
Spoke to patient, who stated that pre admit would not retest, due to patient being positive on 01/08/2020. Protocol is that patient does nto have to be retested within 90day of + test. Patient is aware that he may proceed with PFT. He also stated that she had not been contact by [facility] for cpap machine. Order was placed to [facility] on 01/03/2020.  Rodena Piety, please advise. Thanks

## 2020-02-25 NOTE — Progress Notes (Unsigned)
Arrived at the covid testing site, stated that he had a positive covid test in November 2021 and did not test again after that. Results seen in care everywhere. Per protocol, patient does not need to retest with in 90 days after a positive covid test. Patient instructed to call the Dr. And or department that ordered the PFT and inform them of this. No covid test was performed.

## 2020-02-26 ENCOUNTER — Other Ambulatory Visit: Payer: Self-pay

## 2020-02-26 ENCOUNTER — Ambulatory Visit: Payer: PPO | Attending: Primary Care

## 2020-02-26 DIAGNOSIS — J984 Other disorders of lung: Secondary | ICD-10-CM | POA: Insufficient documentation

## 2020-02-26 DIAGNOSIS — R0602 Shortness of breath: Secondary | ICD-10-CM | POA: Diagnosis not present

## 2020-02-26 MED ORDER — ALBUTEROL SULFATE (2.5 MG/3ML) 0.083% IN NEBU
2.5000 mg | INHALATION_SOLUTION | Freq: Once | RESPIRATORY_TRACT | Status: AC
  Administered 2020-02-26: 2.5 mg via RESPIRATORY_TRACT
  Filled 2020-02-26: qty 3

## 2020-02-27 NOTE — Progress Notes (Signed)
PFTs showed moderate obstructive lung disease. Looks like apt on 1/19 was cancelled. Needs office visit to go over results. Anty update on CPAP?

## 2020-02-28 ENCOUNTER — Telehealth: Payer: Self-pay | Admitting: Internal Medicine

## 2020-02-28 LAB — PULMONARY FUNCTION TEST ARMC ONLY
DL/VA % pred: 99 %
DL/VA: 3.91 ml/min/mmHg/L
DLCO unc % pred: 78 %
DLCO unc: 20.43 ml/min/mmHg
FEF 25-75 Post: 1.68 L/sec
FEF 25-75 Pre: 1.59 L/sec
FEF2575-%Change-Post: 6 %
FEF2575-%Pred-Post: 72 %
FEF2575-%Pred-Pre: 68 %
FEV1-%Change-Post: 0 %
FEV1-%Pred-Post: 60 %
FEV1-%Pred-Pre: 60 %
FEV1-Post: 1.94 L
FEV1-Pre: 1.93 L
FEV1FVC-%Change-Post: 0 %
FEV1FVC-%Pred-Pre: 90 %
FEV6-%Change-Post: 2 %
FEV6-%Pred-Post: 72 %
FEV6-%Pred-Pre: 70 %
FEV6-Post: 2.99 L
FEV6-Pre: 2.93 L
FEV6FVC-%Change-Post: 0 %
FEV6FVC-%Pred-Post: 106 %
FEV6FVC-%Pred-Pre: 105 %
FVC-%Change-Post: 1 %
FVC-%Pred-Post: 67 %
FVC-%Pred-Pre: 67 %
FVC-Post: 2.99 L
Post FEV1/FVC ratio: 65 %
Post FEV6/FVC ratio: 100 %
Pre FEV1/FVC ratio: 66 %
Pre FEV6/FVC Ratio: 99 %
RV % pred: 54 %
RV: 1.43 L
TLC % pred: 73 %
TLC: 5.34 L

## 2020-02-28 NOTE — Telephone Encounter (Signed)
Martyn Ehrich, NP  Claudette Head A, CMA PFTs showed moderate obstructive lung disease. Looks like apt on 1/19 was cancelled. Needs office visit to go over results. Anty update on CPAP?   Patient is aware of results and voiced his understanding.  03/05/2020 visit was canceled due to patient not receiving BIPAP yet. Patient stated that he contacted [employer] 4-5 days ago for update on bipap and was advised to contact our office.  I spoke to [facility] with [employer], who stated that she would look into this and call our office back.   Routing to [employer] as an Pharmacist, hospital.

## 2020-02-28 NOTE — Telephone Encounter (Signed)
Sent urgent message to [employer] asking someone to contact the patient with an update on his Bipap machine

## 2020-02-28 NOTE — Telephone Encounter (Signed)
Noted, thanks!

## 2020-02-28 NOTE — Telephone Encounter (Signed)
Jason Washington responded to my message Volney Presser was able to obtain a BIPAP for this patient. They are now scheduled for next wed. 1/19 @ 11am.

## 2020-03-04 NOTE — Telephone Encounter (Signed)
Lm for Melissa with [employer].  ?

## 2020-03-05 ENCOUNTER — Ambulatory Visit: Payer: PPO | Admitting: Primary Care

## 2020-03-05 DIAGNOSIS — G4733 Obstructive sleep apnea (adult) (pediatric): Secondary | ICD-10-CM | POA: Diagnosis not present

## 2020-03-05 NOTE — Telephone Encounter (Signed)
Spoke to [place] with [employer], who stated that bipap is scheduled to be delivered today, 03/05/2020. Nothing further needed.

## 2020-03-12 DIAGNOSIS — G4733 Obstructive sleep apnea (adult) (pediatric): Secondary | ICD-10-CM | POA: Diagnosis not present

## 2020-03-14 ENCOUNTER — Other Ambulatory Visit: Payer: Self-pay | Admitting: Internal Medicine

## 2020-03-14 DIAGNOSIS — R0602 Shortness of breath: Secondary | ICD-10-CM

## 2020-04-05 DIAGNOSIS — G4733 Obstructive sleep apnea (adult) (pediatric): Secondary | ICD-10-CM | POA: Diagnosis not present

## 2020-04-11 DIAGNOSIS — E782 Mixed hyperlipidemia: Secondary | ICD-10-CM | POA: Diagnosis not present

## 2020-04-11 DIAGNOSIS — E119 Type 2 diabetes mellitus without complications: Secondary | ICD-10-CM | POA: Diagnosis not present

## 2020-04-11 DIAGNOSIS — I10 Essential (primary) hypertension: Secondary | ICD-10-CM | POA: Diagnosis not present

## 2020-04-24 DIAGNOSIS — R911 Solitary pulmonary nodule: Secondary | ICD-10-CM | POA: Diagnosis not present

## 2020-04-24 DIAGNOSIS — Z0000 Encounter for general adult medical examination without abnormal findings: Secondary | ICD-10-CM | POA: Diagnosis not present

## 2020-04-24 DIAGNOSIS — K760 Fatty (change of) liver, not elsewhere classified: Secondary | ICD-10-CM | POA: Diagnosis not present

## 2020-04-24 DIAGNOSIS — J449 Chronic obstructive pulmonary disease, unspecified: Secondary | ICD-10-CM | POA: Diagnosis not present

## 2020-04-24 DIAGNOSIS — I700 Atherosclerosis of aorta: Secondary | ICD-10-CM | POA: Diagnosis not present

## 2020-04-24 DIAGNOSIS — E119 Type 2 diabetes mellitus without complications: Secondary | ICD-10-CM | POA: Diagnosis not present

## 2020-04-24 DIAGNOSIS — Z1331 Encounter for screening for depression: Secondary | ICD-10-CM | POA: Diagnosis not present

## 2020-04-24 DIAGNOSIS — I10 Essential (primary) hypertension: Secondary | ICD-10-CM | POA: Diagnosis not present

## 2020-04-24 DIAGNOSIS — E782 Mixed hyperlipidemia: Secondary | ICD-10-CM | POA: Diagnosis not present

## 2020-04-24 DIAGNOSIS — Z87891 Personal history of nicotine dependence: Secondary | ICD-10-CM | POA: Diagnosis not present

## 2020-04-24 DIAGNOSIS — R918 Other nonspecific abnormal finding of lung field: Secondary | ICD-10-CM | POA: Diagnosis not present

## 2020-04-24 DIAGNOSIS — K7689 Other specified diseases of liver: Secondary | ICD-10-CM | POA: Diagnosis not present

## 2020-04-24 DIAGNOSIS — K219 Gastro-esophageal reflux disease without esophagitis: Secondary | ICD-10-CM | POA: Diagnosis not present

## 2020-04-24 DIAGNOSIS — I712 Thoracic aortic aneurysm, without rupture: Secondary | ICD-10-CM | POA: Diagnosis not present

## 2020-05-03 DIAGNOSIS — G4733 Obstructive sleep apnea (adult) (pediatric): Secondary | ICD-10-CM | POA: Diagnosis not present

## 2020-07-10 ENCOUNTER — Other Ambulatory Visit: Payer: Self-pay

## 2020-07-10 ENCOUNTER — Ambulatory Visit: Payer: PPO | Admitting: Dermatology

## 2020-07-10 DIAGNOSIS — L820 Inflamed seborrheic keratosis: Secondary | ICD-10-CM | POA: Diagnosis not present

## 2020-07-10 DIAGNOSIS — L578 Other skin changes due to chronic exposure to nonionizing radiation: Secondary | ICD-10-CM | POA: Diagnosis not present

## 2020-07-10 DIAGNOSIS — L570 Actinic keratosis: Secondary | ICD-10-CM

## 2020-07-10 DIAGNOSIS — L821 Other seborrheic keratosis: Secondary | ICD-10-CM

## 2020-07-10 NOTE — Patient Instructions (Signed)

## 2020-07-10 NOTE — Progress Notes (Signed)
Follow-Up Visit   Subjective  Jason Washington is a 75 y.o. male who presents for the following: Actinic Keratosis (scalp, 39m f/u) and ISK f/u (Trunk, scalp, face 73m f/u).  The following portions of the chart were reviewed this encounter and updated as appropriate:   Tobacco  Allergies  Meds  Problems  Med Hx  Surg Hx  Fam Hx     Review of Systems:  No other skin or systemic complaints except as noted in HPI or Assessment and Plan.  Objective  Well appearing patient in no apparent distress; mood and affect are within normal limits.  A focused examination was performed including face, scalp, trunk. Relevant physical exam findings are noted in the Assessment and Plan.  Objective  Scalp x 20 (20): Pink scaly macules   Objective  Scalp x 19, L shoulder/chest x 29 (48): Erythematous keratotic or waxy stuck-on papule or plaque.    Assessment & Plan  AK (actinic keratosis) (20) Scalp x 20  Destruction of lesion - Scalp x 20 Complexity: simple   Destruction method: cryotherapy   Informed consent: discussed and consent obtained   Timeout:  patient name, date of birth, surgical site, and procedure verified Lesion destroyed using liquid nitrogen: Yes   Region frozen until ice ball extended beyond lesion: Yes   Outcome: patient tolerated procedure well with no complications   Post-procedure details: wound care instructions given    Inflamed seborrheic keratosis (48) Scalp x 19, L shoulder/chest x 29  Destruction of lesion - Scalp x 19, L shoulder/chest x 29 Complexity: simple   Destruction method: cryotherapy   Informed consent: discussed and consent obtained   Timeout:  patient name, date of birth, surgical site, and procedure verified Lesion destroyed using liquid nitrogen: Yes   Region frozen until ice ball extended beyond lesion: Yes   Outcome: patient tolerated procedure well with no complications   Post-procedure details: wound care instructions given      Actinic Damage - Severe, confluent actinic changes with pre-cancerous actinic keratoses  - Severe, chronic, not at goal, secondary to cumulative UV radiation exposure over time - diffuse scaly erythematous macules and papules with underlying dyspigmentation - Discussed Prescription "Field Treatment" for Severe, Chronic Confluent Actinic Changes with Pre-Cancerous Actinic Keratoses Field treatment involves treatment of an entire area of skin that has confluent Actinic Changes (Sun/ Ultraviolet light damage) and PreCancerous Actinic Keratoses by method of PhotoDynamic Therapy (PDT) and/or prescription Topical Chemotherapy agents such as 5-fluorouracil, 5-fluorouracil/calcipotriene, and/or imiquimod.  The purpose is to decrease the number of clinically evident and subclinical PreCancerous lesions to prevent progression to development of skin cancer by chemically destroying early precancer changes that may or may not be visible.  It has been shown to reduce the risk of developing skin cancer in the treated area. As a result of treatment, redness, scaling, crusting, and open sores may occur during treatment course. One or more than one of these methods may be used and may have to be used several times to control, suppress and eliminate the PreCancerous changes. Discussed treatment course, expected reaction, and possible side effects. - Recommend daily broad spectrum sunscreen SPF 30+ to sun-exposed areas, reapply every 2 hours as needed.  - Staying in the shade or wearing long sleeves, sun glasses (UVA+UVB protection) and wide brim hats (4-inch brim around the entire circumference of the hat) are also recommended. - Call for new or changing lesions. - In 1 month plan PDT with ALA to scalp with  2 hour incubation.  Seborrheic Keratoses - Stuck-on, waxy, tan-brown papules and/or plaques  - Benign-appearing - Discussed benign etiology and prognosis. - Observe - Call for any changes  Return in about 1 month  (around 08/10/2020) for PDT scalp with 2 hour incubation, 59m ISKs R shoulder/chest, 4-30m AK f/u.   I, Othelia Pulling, RMA, am acting as scribe for Sarina Ser, MD .  Documentation: I have reviewed the above documentation for accuracy and completeness, and I agree with the above.  Sarina Ser, MD

## 2020-07-14 ENCOUNTER — Encounter: Payer: Self-pay | Admitting: Dermatology

## 2020-07-15 DIAGNOSIS — H01001 Unspecified blepharitis right upper eyelid: Secondary | ICD-10-CM | POA: Diagnosis not present

## 2020-07-15 DIAGNOSIS — H02834 Dermatochalasis of left upper eyelid: Secondary | ICD-10-CM | POA: Diagnosis not present

## 2020-07-15 DIAGNOSIS — H02831 Dermatochalasis of right upper eyelid: Secondary | ICD-10-CM | POA: Diagnosis not present

## 2020-08-01 DIAGNOSIS — H02834 Dermatochalasis of left upper eyelid: Secondary | ICD-10-CM | POA: Diagnosis not present

## 2020-08-01 DIAGNOSIS — H02831 Dermatochalasis of right upper eyelid: Secondary | ICD-10-CM | POA: Diagnosis not present

## 2020-08-07 ENCOUNTER — Ambulatory Visit: Payer: PPO

## 2020-09-02 DIAGNOSIS — G4733 Obstructive sleep apnea (adult) (pediatric): Secondary | ICD-10-CM | POA: Diagnosis not present

## 2020-09-11 ENCOUNTER — Ambulatory Visit: Payer: PPO | Admitting: Dermatology

## 2020-09-22 ENCOUNTER — Other Ambulatory Visit (HOSPITAL_COMMUNITY): Payer: Self-pay | Admitting: Orthopedic Surgery

## 2020-09-22 ENCOUNTER — Other Ambulatory Visit: Payer: Self-pay | Admitting: Orthopedic Surgery

## 2020-09-22 DIAGNOSIS — M5441 Lumbago with sciatica, right side: Secondary | ICD-10-CM | POA: Diagnosis not present

## 2020-09-22 DIAGNOSIS — M4807 Spinal stenosis, lumbosacral region: Secondary | ICD-10-CM

## 2020-09-22 DIAGNOSIS — M48062 Spinal stenosis, lumbar region with neurogenic claudication: Secondary | ICD-10-CM | POA: Diagnosis not present

## 2020-09-22 DIAGNOSIS — M5442 Lumbago with sciatica, left side: Secondary | ICD-10-CM | POA: Diagnosis not present

## 2020-09-30 ENCOUNTER — Ambulatory Visit
Admission: RE | Admit: 2020-09-30 | Discharge: 2020-09-30 | Disposition: A | Discharge status: Home / Self Care | Payer: PPO | Source: Ambulatory Visit | Attending: Orthopedic Surgery | Admitting: Orthopedic Surgery

## 2020-09-30 DIAGNOSIS — M4807 Spinal stenosis, lumbosacral region: Secondary | ICD-10-CM

## 2020-09-30 DIAGNOSIS — M545 Low back pain, unspecified: Secondary | ICD-10-CM | POA: Diagnosis not present

## 2020-10-03 DIAGNOSIS — G4733 Obstructive sleep apnea (adult) (pediatric): Secondary | ICD-10-CM | POA: Diagnosis not present

## 2020-10-14 DIAGNOSIS — M47816 Spondylosis without myelopathy or radiculopathy, lumbar region: Secondary | ICD-10-CM | POA: Diagnosis not present

## 2020-10-14 DIAGNOSIS — M5136 Other intervertebral disc degeneration, lumbar region: Secondary | ICD-10-CM | POA: Diagnosis not present

## 2020-10-14 DIAGNOSIS — R2689 Other abnormalities of gait and mobility: Secondary | ICD-10-CM | POA: Diagnosis not present

## 2020-10-29 DIAGNOSIS — E782 Mixed hyperlipidemia: Secondary | ICD-10-CM | POA: Diagnosis not present

## 2020-10-29 DIAGNOSIS — J449 Chronic obstructive pulmonary disease, unspecified: Secondary | ICD-10-CM | POA: Diagnosis not present

## 2020-10-29 DIAGNOSIS — I712 Thoracic aortic aneurysm, without rupture: Secondary | ICD-10-CM | POA: Diagnosis not present

## 2020-10-29 DIAGNOSIS — E119 Type 2 diabetes mellitus without complications: Secondary | ICD-10-CM | POA: Diagnosis not present

## 2020-10-29 DIAGNOSIS — I700 Atherosclerosis of aorta: Secondary | ICD-10-CM | POA: Diagnosis not present

## 2020-10-29 DIAGNOSIS — R918 Other nonspecific abnormal finding of lung field: Secondary | ICD-10-CM | POA: Diagnosis not present

## 2020-10-29 DIAGNOSIS — K760 Fatty (change of) liver, not elsewhere classified: Secondary | ICD-10-CM | POA: Diagnosis not present

## 2020-10-29 DIAGNOSIS — C61 Malignant neoplasm of prostate: Secondary | ICD-10-CM | POA: Diagnosis not present

## 2020-10-29 DIAGNOSIS — Z125 Encounter for screening for malignant neoplasm of prostate: Secondary | ICD-10-CM | POA: Diagnosis not present

## 2020-10-29 DIAGNOSIS — R911 Solitary pulmonary nodule: Secondary | ICD-10-CM | POA: Diagnosis not present

## 2020-10-29 DIAGNOSIS — K219 Gastro-esophageal reflux disease without esophagitis: Secondary | ICD-10-CM | POA: Diagnosis not present

## 2020-10-29 DIAGNOSIS — I10 Essential (primary) hypertension: Secondary | ICD-10-CM | POA: Diagnosis not present

## 2020-10-29 DIAGNOSIS — Z87891 Personal history of nicotine dependence: Secondary | ICD-10-CM | POA: Diagnosis not present

## 2020-10-29 DIAGNOSIS — K7689 Other specified diseases of liver: Secondary | ICD-10-CM | POA: Diagnosis not present

## 2020-10-31 DIAGNOSIS — H539 Unspecified visual disturbance: Secondary | ICD-10-CM | POA: Diagnosis not present

## 2020-10-31 DIAGNOSIS — R262 Difficulty in walking, not elsewhere classified: Secondary | ICD-10-CM | POA: Diagnosis not present

## 2020-10-31 DIAGNOSIS — R479 Unspecified speech disturbances: Secondary | ICD-10-CM | POA: Diagnosis not present

## 2020-10-31 DIAGNOSIS — M545 Low back pain, unspecified: Secondary | ICD-10-CM | POA: Diagnosis not present

## 2020-10-31 DIAGNOSIS — R531 Weakness: Secondary | ICD-10-CM | POA: Diagnosis not present

## 2020-10-31 DIAGNOSIS — R42 Dizziness and giddiness: Secondary | ICD-10-CM | POA: Diagnosis not present

## 2020-10-31 DIAGNOSIS — G8929 Other chronic pain: Secondary | ICD-10-CM | POA: Diagnosis not present

## 2020-10-31 DIAGNOSIS — R2689 Other abnormalities of gait and mobility: Secondary | ICD-10-CM | POA: Diagnosis not present

## 2020-11-03 DIAGNOSIS — G4733 Obstructive sleep apnea (adult) (pediatric): Secondary | ICD-10-CM | POA: Diagnosis not present

## 2020-11-19 DIAGNOSIS — R200 Anesthesia of skin: Secondary | ICD-10-CM | POA: Diagnosis not present

## 2020-11-25 DIAGNOSIS — M5136 Other intervertebral disc degeneration, lumbar region: Secondary | ICD-10-CM | POA: Diagnosis not present

## 2020-11-25 DIAGNOSIS — M47816 Spondylosis without myelopathy or radiculopathy, lumbar region: Secondary | ICD-10-CM | POA: Diagnosis not present

## 2020-11-25 DIAGNOSIS — R2689 Other abnormalities of gait and mobility: Secondary | ICD-10-CM | POA: Diagnosis not present

## 2020-11-26 DIAGNOSIS — I10 Essential (primary) hypertension: Secondary | ICD-10-CM | POA: Diagnosis not present

## 2020-12-04 ENCOUNTER — Ambulatory Visit: Payer: PPO | Admitting: Dermatology

## 2020-12-04 ENCOUNTER — Other Ambulatory Visit: Payer: Self-pay

## 2020-12-04 DIAGNOSIS — L820 Inflamed seborrheic keratosis: Secondary | ICD-10-CM

## 2020-12-04 DIAGNOSIS — L918 Other hypertrophic disorders of the skin: Secondary | ICD-10-CM

## 2020-12-04 DIAGNOSIS — L570 Actinic keratosis: Secondary | ICD-10-CM | POA: Diagnosis not present

## 2020-12-04 DIAGNOSIS — L578 Other skin changes due to chronic exposure to nonionizing radiation: Secondary | ICD-10-CM | POA: Diagnosis not present

## 2020-12-04 MED ORDER — FLUOROURACIL 5 % EX CREA: TOPICAL_CREAM | Freq: Two times a day (BID) | CUTANEOUS | 1 refills | Status: DC

## 2020-12-04 NOTE — Patient Instructions (Addendum)
Actinic keratoses are precancerous spots that appear secondary to cumulative UV radiation exposure/sun exposure over time. They are chronic with expected duration over 1 year. A portion of actinic keratoses will progress to squamous cell carcinoma of the skin. It is not possible to reliably predict which spots will progress to skin cancer and so treatment is recommended to prevent development of skin cancer.  Recommend daily broad spectrum sunscreen SPF 30+ to sun-exposed areas, reapply every 2 hours as needed.  Recommend staying in the shade or wearing long sleeves, sun glasses (UVA+UVB protection) and wide brim hats (4-inch brim around the entire circumference of the hat). Call for new or changing lesions.   Cryotherapy Aftercare  Wash gently with soap and water everyday.   Apply Vaseline and Band-Aid daily until healed.   Start cream mid to late November Apply to scalp and forehead twice daily for 7 days   5-Fluorouracil/Calcipotriene Patient Education   Actinic keratoses are the dry, red scaly spots on the skin caused by sun damage. A portion of these spots can turn into skin cancer with time, and treating them can help prevent development of skin cancer.   Treatment of these spots requires removal of the defective skin cells. There are various ways to remove actinic keratoses, including freezing with liquid nitrogen, treatment with creams, or treatment with a blue light procedure in the office.   5-fluorouracil cream is a topical cream used to treat actinic keratoses. It works by interfering with the growth of abnormal fast-growing skin cells, such as actinic keratoses. These cells peel off and are replaced by healthy ones.   5-fluorouracil/calcipotriene is a combination of the 5-fluorouracil cream with a vitamin D analog cream called calcipotriene. The calcipotriene alone does not treat actinic keratoses. However, when it is combined with 5-fluorouracil, it helps the 5-fluorouracil treat  the actinic keratoses much faster so that the same results can be achieved with a much shorter treatment time.  INSTRUCTIONS FOR 5-FLUOROURACIL/CALCIPOTRIENE CREAM:   5-fluorouracil/calcipotriene cream typically only needs to be used for 4-7 days. A thin layer should be applied twice a day to the treatment areas recommended by your physician.   If your physician prescribed you separate tubes of 5-fluourouracil and calcipotriene, apply a thin layer of 5-fluorouracil followed by a thin layer of calcipotriene.   Avoid contact with your eyes, nostrils, and mouth. Do not use 5-fluorouracil/calcipotriene cream on infected or open wounds.   You will develop redness, irritation and some crusting at areas where you have pre-cancer damage/actinic keratoses. IF YOU DEVELOP PAIN, BLEEDING, OR SIGNIFICANT CRUSTING, STOP THE TREATMENT EARLY - you have already gotten a good response and the actinic keratoses should clear up well.  Wash your hands after applying 5-fluorouracil 5% cream on your skin.   A moisturizer or sunscreen with a minimum SPF 30 should be applied each morning.   Once you have finished the treatment, you can apply a thin layer of Vaseline twice a day to irritated areas to soothe and calm the areas more quickly. If you experience significant discomfort, contact your physician.  For some patients it is necessary to repeat the treatment for best results.  If you have any questions or concerns for your doctor, please call our main line at [phone] and press option 4 to reach your doctor's medical assistant. If no one answers, please leave a voicemail as directed and we will return your call as soon as possible. Messages left after 4 pm will be answered the following business day.  You may also send [place] a message via [place]. We typically respond to MyChart messages within 1-2 business days.  For prescription refills, please ask your pharmacy to contact our office. Our fax number is  [phone].  If you have an urgent issue when the clinic is closed that cannot wait until the next business day, you can page your doctor at the number below.    Please note that while we do our best to be available for urgent issues outside of office hours, we are not available 24/7.   If you have an urgent issue and are unable to reach [place], you may choose to seek medical care at your doctor's office, retail clinic, urgent care center, or emergency room.  If you have a medical emergency, please immediately call 911 or go to the emergency department.  Pager Numbers  - Dr. Nehemiah Massed: [phone]  - Dr. Laurence Ferrari: [phone]  - Dr. Nicole Kindred: [phone]  In the event of inclement weather, please call our main line at [phone] for an update on the status of any delays or closures.  Dermatology Medication Tips: Please keep the boxes that topical medications come in in order to help keep track of the instructions about where and how to use these. Pharmacies typically print the medication instructions only on the boxes and not directly on the medication tubes.   If your medication is too expensive, please contact our office at [phone] option 4 or send [place] a message through [place].   We are unable to tell what your co-pay for medications will be in advance as this is different depending on your insurance coverage. However, we may be able to find a substitute medication at lower cost or fill out paperwork to get insurance to cover a needed medication.   If a prior authorization is required to get your medication covered by your insurance company, please allow [place] 1-2 business days to complete this process.  Drug prices often vary depending on where the prescription is filled and some pharmacies may offer cheaper prices.  The website www.goodrx.com contains coupons for medications through different pharmacies. The prices here do not account for what the cost may be with help from  insurance (it may be cheaper with your insurance), but the website can give you the price if you did not use any insurance.  - You can print the associated coupon and take it with your prescription to the pharmacy.  - You may also stop by our office during regular business hours and pick up a GoodRx coupon card.  - If you need your prescription sent electronically to a different pharmacy, notify our office through [facility] or by phone at [phone] option 4.   SIDE EFFECTS: When using 5-fluorouracil/calcipotriene cream, you may have mild irritation, such as redness, dryness, swelling, or a mild burning sensation. This usually resolves within 2 weeks. The more actinic keratoses you have, the more redness and inflammation you can expect during treatment. Eye irritation has been reported rarely. If this occurs, please let us know.  If you have any trouble using this cream, please call the office. If you have any other questions about this information, please do not hesitate to ask me before you leave the office.

## 2020-12-04 NOTE — Progress Notes (Signed)
Follow-Up Visit   Subjective  Jason Washington is a 75 y.o. male who presents for the following: Follow-up (Patient here today for 5 month ak follow up at face and scalp. Patient reports several other areas to be evaluated today.  The following portions of the chart were reviewed this encounter and updated as appropriate:  Tobacco  Allergies  Meds  Problems  Med Hx  Surg Hx  Fam Hx     Review of Systems: No other skin or systemic complaints except as noted in HPI or Assessment and Plan.  Objective  Well appearing patient in no apparent distress; mood and affect are within normal limits.  A focused examination was performed including face, neck, back, scalp, left shoulder. Relevant physical exam findings are noted in the Assessment and Plan.  Scalp x 21 (21) Erythematous thin papules/macules with gritty scale.   back x 2, left shoulder x 1 (3) Erythematous keratotic or waxy stuck-on papule or plaque.   Assessment & Plan  Actinic keratosis (21) Scalp x 21  Actinic keratoses are precancerous spots that appear secondary to cumulative UV radiation exposure/sun exposure over time. They are chronic with expected duration over 1 year. A portion of actinic keratoses will progress to squamous cell carcinoma of the skin. It is not possible to reliably predict which spots will progress to skin cancer and so treatment is recommended to prevent development of skin cancer.  Recommend daily broad spectrum sunscreen SPF 30+ to sun-exposed areas, reapply every 2 hours as needed.  Recommend staying in the shade or wearing long sleeves, sun glasses (UVA+UVB protection) and wide brim hats (4-inch brim around the entire circumference of the hat). Call for new or changing lesions.  Destruction of lesion - Scalp x 21 Complexity: simple   Destruction method: cryotherapy   Informed consent: discussed and consent obtained   Timeout:  patient name, date of birth, surgical site, and procedure  verified Lesion destroyed using liquid nitrogen: Yes   Region frozen until ice ball extended beyond lesion: Yes   Outcome: patient tolerated procedure well with no complications   Post-procedure details: wound care instructions given    fluorouracil (EFUDEX) 5 % cream - Scalp x 21 Apply topically 2 (two) times daily. Apply to scalp and forehead  Inflamed seborrheic keratosis back x 2, left shoulder x 1  Destruction of lesion - back x 2, left shoulder x 1 Complexity: simple   Destruction method: cryotherapy   Informed consent: discussed and consent obtained   Timeout:  patient name, date of birth, surgical site, and procedure verified Lesion destroyed using liquid nitrogen: Yes   Region frozen until ice ball extended beyond lesion: Yes   Outcome: patient tolerated procedure well with no complications   Post-procedure details: wound care instructions given    Acrochordons (Skin Tags) - Fleshy, skin-colored pedunculated papules at left shoulder and neck - Benign appearing.  - Observe. - If desired, they can be removed with an in office procedure that is not covered by insurance. - Please call the clinic if you notice any new or changing lesions.  Actinic Damage - Severe, confluent actinic changes with pre-cancerous actinic keratoses  - Severe, chronic, not at goal, secondary to cumulative UV radiation exposure over time - diffuse scaly erythematous macules and papules with underlying dyspigmentation - Discussed Prescription "Field Treatment" for Severe, Chronic Confluent Actinic Changes with Pre-Cancerous Actinic Keratoses Field treatment involves treatment of an entire area of skin that has confluent Actinic Changes (Sun/ Ultraviolet  light damage) and PreCancerous Actinic Keratoses by method of PhotoDynamic Therapy (PDT) and/or prescription Topical Chemotherapy agents such as 5-fluorouracil, 5-fluorouracil/calcipotriene, and/or imiquimod.  The purpose is to decrease the number of  clinically evident and subclinical PreCancerous lesions to prevent progression to development of skin cancer by chemically destroying early precancer changes that may or may not be visible.  It has been shown to reduce the risk of developing skin cancer in the treated area. As a result of treatment, redness, scaling, crusting, and open sores may occur during treatment course. One or more than one of these methods may be used and may have to be used several times to control, suppress and eliminate the PreCancerous changes. Discussed treatment course, expected reaction, and possible side effects. - Recommend daily broad spectrum sunscreen SPF 30+ to sun-exposed areas, reapply every 2 hours as needed.  - Staying in the shade or wearing long sleeves, sun glasses (UVA+UVB protection) and wide brim hats (4-inch brim around the entire circumference of the hat) are also recommended. - Call for new or changing lesions. Start mid to late November  Start 5-fluorouracil/calcipotriene cream twice a day for 7 days to affected areas including scalp and forehead. Prescription sent to [facility]. Patient provided with contact information for pharmacy and advised the pharmacy will mail the prescription to their home. Patient provided with handout reviewing treatment course and side effects and advised to call or message [place] on MyChart with any concerns.  Return for january ak follow up. IRuthell Rummage, CMA, am acting as scribe for Sarina Ser, MD. Documentation: I have reviewed the above documentation for accuracy and completeness, and I agree with the above.  Sarina Ser, MD

## 2020-12-09 ENCOUNTER — Encounter: Payer: Self-pay | Admitting: Dermatology

## 2020-12-23 DIAGNOSIS — G8929 Other chronic pain: Secondary | ICD-10-CM | POA: Diagnosis not present

## 2020-12-23 DIAGNOSIS — M545 Low back pain, unspecified: Secondary | ICD-10-CM | POA: Diagnosis not present

## 2020-12-23 DIAGNOSIS — R2689 Other abnormalities of gait and mobility: Secondary | ICD-10-CM | POA: Diagnosis not present

## 2020-12-23 DIAGNOSIS — R262 Difficulty in walking, not elsewhere classified: Secondary | ICD-10-CM | POA: Diagnosis not present

## 2020-12-23 DIAGNOSIS — R531 Weakness: Secondary | ICD-10-CM | POA: Diagnosis not present

## 2020-12-24 DIAGNOSIS — I10 Essential (primary) hypertension: Secondary | ICD-10-CM | POA: Diagnosis not present

## 2020-12-24 DIAGNOSIS — M47816 Spondylosis without myelopathy or radiculopathy, lumbar region: Secondary | ICD-10-CM | POA: Diagnosis not present

## 2020-12-24 DIAGNOSIS — M5136 Other intervertebral disc degeneration, lumbar region: Secondary | ICD-10-CM | POA: Diagnosis not present

## 2020-12-24 DIAGNOSIS — R2689 Other abnormalities of gait and mobility: Secondary | ICD-10-CM | POA: Diagnosis not present

## 2021-01-03 DIAGNOSIS — G4733 Obstructive sleep apnea (adult) (pediatric): Secondary | ICD-10-CM | POA: Diagnosis not present

## 2021-01-13 DIAGNOSIS — G4733 Obstructive sleep apnea (adult) (pediatric): Secondary | ICD-10-CM | POA: Diagnosis not present

## 2021-01-21 DIAGNOSIS — G4733 Obstructive sleep apnea (adult) (pediatric): Secondary | ICD-10-CM | POA: Diagnosis not present

## 2021-01-21 DIAGNOSIS — M47816 Spondylosis without myelopathy or radiculopathy, lumbar region: Secondary | ICD-10-CM | POA: Diagnosis not present

## 2021-01-21 DIAGNOSIS — I10 Essential (primary) hypertension: Secondary | ICD-10-CM | POA: Diagnosis not present

## 2021-01-21 DIAGNOSIS — M5136 Other intervertebral disc degeneration, lumbar region: Secondary | ICD-10-CM | POA: Diagnosis not present

## 2021-02-02 DIAGNOSIS — G4733 Obstructive sleep apnea (adult) (pediatric): Secondary | ICD-10-CM | POA: Diagnosis not present

## 2021-02-18 DIAGNOSIS — L02212 Cutaneous abscess of back [any part, except buttock]: Secondary | ICD-10-CM | POA: Diagnosis not present

## 2021-03-03 DIAGNOSIS — M2578 Osteophyte, vertebrae: Secondary | ICD-10-CM | POA: Diagnosis not present

## 2021-03-03 DIAGNOSIS — M5136 Other intervertebral disc degeneration, lumbar region: Secondary | ICD-10-CM | POA: Diagnosis not present

## 2021-03-03 DIAGNOSIS — M47816 Spondylosis without myelopathy or radiculopathy, lumbar region: Secondary | ICD-10-CM | POA: Diagnosis not present

## 2021-03-03 DIAGNOSIS — Z9889 Other specified postprocedural states: Secondary | ICD-10-CM | POA: Diagnosis not present

## 2021-03-05 ENCOUNTER — Other Ambulatory Visit: Payer: Self-pay

## 2021-03-05 ENCOUNTER — Ambulatory Visit: Payer: PPO | Admitting: Dermatology

## 2021-03-05 DIAGNOSIS — L578 Other skin changes due to chronic exposure to nonionizing radiation: Secondary | ICD-10-CM | POA: Diagnosis not present

## 2021-03-05 DIAGNOSIS — L820 Inflamed seborrheic keratosis: Secondary | ICD-10-CM

## 2021-03-05 DIAGNOSIS — L570 Actinic keratosis: Secondary | ICD-10-CM

## 2021-03-05 DIAGNOSIS — G4733 Obstructive sleep apnea (adult) (pediatric): Secondary | ICD-10-CM | POA: Diagnosis not present

## 2021-03-05 MED ORDER — FLUOROURACIL 5 % EX CREA: TOPICAL_CREAM | Freq: Two times a day (BID) | CUTANEOUS | 2 refills | Status: DC

## 2021-03-05 NOTE — Progress Notes (Signed)
Follow-Up Visit   Subjective  Jason Washington is a 76 y.o. male who presents for the following: Actinic Keratosis (Scalp, face, 61m f/u, pt used 5FU/Calcipotriene cream bid for 2-3 wks back in 11/22) and growth (R arm, 47m, no symptoms). The patient has spots, moles and lesions to be evaluated, some may be new or changing and the patient has concerns that these could be cancer.  The following portions of the chart were reviewed this encounter and updated as appropriate:   Tobacco   Allergies   Meds   Problems   Med Hx   Surg Hx   Fam Hx      Review of Systems:  No other skin or systemic complaints except as noted in HPI or Assessment and Plan.  Objective  Well appearing patient in no apparent distress; mood and affect are within normal limits.  A focused examination was performed including face, scalp, arms. Relevant physical exam findings are noted in the Assessment and Plan.  scalp x 20 (20) Pink scaly macules  R arm x 1, L arm x 1 (2) Stuck on waxy paps with erythema  Assessment & Plan   Actinic Damage - Severe, confluent actinic changes with pre-cancerous actinic keratoses  - Severe, chronic, not at goal, secondary to cumulative UV radiation exposure over time - diffuse scaly erythematous macules and papules with underlying dyspigmentation - Discussed Prescription "Field Treatment" for Severe, Chronic Confluent Actinic Changes with Pre-Cancerous Actinic Keratoses Field treatment involves treatment of an entire area of skin that has confluent Actinic Changes (Sun/ Ultraviolet light damage) and PreCancerous Actinic Keratoses by method of PhotoDynamic Therapy (PDT) and/or prescription Topical Chemotherapy agents such as 5-fluorouracil, 5-fluorouracil/calcipotriene, and/or imiquimod.  The purpose is to decrease the number of clinically evident and subclinical PreCancerous lesions to prevent progression to development of skin cancer by chemically destroying early precancer changes  that may or may not be visible.  It has been shown to reduce the risk of developing skin cancer in the treated area. As a result of treatment, redness, scaling, crusting, and open sores may occur during treatment course. One or more than one of these methods may be used and may have to be used several times to control, suppress and eliminate the PreCancerous changes. Discussed treatment course, expected reaction, and possible side effects. - Recommend daily broad spectrum sunscreen SPF 30+ to sun-exposed areas, reapply every 2 hours as needed.  - Staying in the shade or wearing long sleeves, sun glasses (UVA+UVB protection) and wide brim hats (4-inch brim around the entire circumference of the hat) are also recommended. - Call for new or changing lesions.  Start 5-fluorouracil/calcipotriene cream twice a day for 7 days to affected areas including scalp. Prescription sent to [facility]. Patient provided with contact information for pharmacy and advised the pharmacy will mail the prescription to their home. Patient provided with handout reviewing treatment course and side effects and advised to call or message [place] on MyChart with any concerns.   AK (actinic keratosis) (20) scalp x 20  Destruction of lesion - scalp x 20 Complexity: simple   Destruction method: cryotherapy   Informed consent: discussed and consent obtained   Timeout:  patient name, date of birth, surgical site, and procedure verified Lesion destroyed using liquid nitrogen: Yes   Region frozen until ice ball extended beyond lesion: Yes   Outcome: patient tolerated procedure well with no complications   Post-procedure details: wound care instructions given    fluorouracil (EFUDEX) 5 %  cream - scalp x 20 Apply topically 2 (two) times daily. Bid to scalp  Inflamed seborrheic keratosis (2) R arm x 1, L arm x 1  Destruction of lesion - R arm x 1, L arm x 1 Complexity: simple   Destruction method: cryotherapy   Informed  consent: discussed and consent obtained   Timeout:  patient name, date of birth, surgical site, and procedure verified Lesion destroyed using liquid nitrogen: Yes   Region frozen until ice ball extended beyond lesion: Yes   Outcome: patient tolerated procedure well with no complications   Post-procedure details: wound care instructions given     Return in about 3 months (around 06/03/2021) for AK f/u.  I, Othelia Pulling, RMA, am acting as scribe for Sarina Ser, MD . Documentation: I have reviewed the above documentation for accuracy and completeness, and I agree with the above.  Sarina Ser, MD

## 2021-03-05 NOTE — Patient Instructions (Addendum)
If You Need Anything After Your Visit ° °If you have any questions or concerns for your doctor, please call our main line at [phone] and press option 4 to reach your doctor's medical assistant. If no one answers, please leave a voicemail as directed and we will return your call as soon as possible. Messages left after 4 pm will be answered the following business day.  ° °You may also send us a message via MyChart. We typically respond to MyChart messages within 1-2 business days. ° °For prescription refills, please ask your pharmacy to contact our office. Our fax number is [phone]. ° °If you have an urgent issue when the clinic is closed that cannot wait until the next business day, you can page your doctor at the number below.   ° °Please note that while we do our best to be available for urgent issues outside of office hours, we are not available 24/7.  ° °If you have an urgent issue and are unable to reach us, you may choose to seek medical care at your doctor's office, retail clinic, urgent care center, or emergency room. ° °If you have a medical emergency, please immediately call 911 or go to the emergency department. ° °Pager Numbers ° °- Dr. Kowalski: [phone] ° °- Dr. Moye: [phone] ° °- Dr. Stewart: [phone] ° °In the event of inclement weather, please call our main line at [phone] for an update on the status of any delays or closures. ° °Dermatology Medication Tips: °Please keep the boxes that topical medications come in in order to help keep track of the instructions about where and how to use these. Pharmacies typically print the medication instructions only on the boxes and not directly on the medication tubes.  ° °If your medication is too expensive, please contact our office at [phone] option 4 or send us a message through MyChart.  ° °We are unable to tell what your co-pay for medications will be in advance as this is different depending on your insurance coverage.  However, we may be able to find a substitute medication at lower cost or fill out paperwork to get insurance to cover a needed medication.  ° °If a prior authorization is required to get your medication covered by your insurance company, please allow us 1-2 business days to complete this process. ° °Drug prices often vary depending on where the prescription is filled and some pharmacies may offer cheaper prices. ° °The website www.goodrx.com contains coupons for medications through different pharmacies. The prices here do not account for what the cost may be with help from insurance (it may be cheaper with your insurance), but the website can give you the price if you did not use any insurance.  °- You can print the associated coupon and take it with your prescription to the pharmacy.  °- You may also stop by our office during regular business hours and pick up a GoodRx coupon card.  °- If you need your prescription sent electronically to a different pharmacy, notify our office through [facility] MyChart or by phone at [phone] option 4. ° ° ° ° °Si Usted Necesita Algo Después de Su Visita ° °También puede enviarnos un mensaje a través de MyChart. Por lo general respondemos a los mensajes de MyChart en el transcurso de 1 a 2 días hábiles. ° °Para renovar recetas, por favor pida a su farmacia que se ponga en contacto con nuestra oficina. Nuestro número de fax es el [phone]. ° °Si tiene   un asunto urgente cuando la clnica est cerrada y que no puede esperar hasta el siguiente da hbil, puede llamar/localizar a su doctor(a) al nmero que aparece a continuacin.   Por favor, tenga en cuenta que aunque hacemos todo lo posible para estar disponibles para asuntos urgentes fuera del horario de [place], no estamos disponibles las 24 horas del da, los 7 das de la [place].   Si tiene un problema urgente y no puede comunicarse con nosotros, puede optar por buscar atencin mdica  en el consultorio de su  doctor(a), en una clnica privada, en un centro de atencin urgente o en una sala de emergencias.  Si tiene Engineering geologist, por favor llame inmediatamente al 911 o vaya a la sala de emergencias.  Nmeros de bper  - Dr. Nehemiah Massed: [phone]  - Dra. Moye: [phone]  - Dra. Nicole Kindred: [phone]  En caso de inclemencias del [place], por favor llame a Johnsie Kindred principal al [phone] para una actualizacin sobre el [place] de cualquier retraso o cierre.  Consejos para la medicacin en dermatologa: Por favor, guarde las cajas en las que vienen los medicamentos de uso tpico para ayudarle a seguir las instrucciones sobre dnde y cmo usarlos. Las farmacias generalmente imprimen las instrucciones del medicamento slo en las cajas y no directamente en los tubos del [place].   Si su medicamento es muy caro, por favor, pngase en contacto con Zigmund Daniel llamando al [phone] y presione la opcin 4 o envenos un mensaje a travs de Pharmacist, community.   No podemos decirle cul ser su copago por los medicamentos por adelantado ya que esto es diferente dependiendo de la cobertura de su seguro. Sin embargo, es posible que podamos encontrar un medicamento sustituto a Electrical engineer un formulario para que el seguro cubra el medicamento que se considera necesario.   Si se requiere una autorizacin previa para que su compaa de seguros [place] su medicamento, por favor permtanos de 1 a 2 das hbiles para completar este proceso.  Los precios de los medicamentos varan con frecuencia dependiendo del Environmental consultant de dnde se surte la receta y alguna farmacias pueden ofrecer precios ms baratos.  El sitio web www.goodrx.com tiene cupones para medicamentos de Airline pilot. Los precios aqu no tienen en cuenta lo que podra costar con la ayuda del seguro (puede ser ms barato con su seguro), pero el sitio web puede darle el precio si no utiliz Research scientist (physical sciences).  - Puede imprimir el cupn  correspondiente y llevarlo con su receta a la farmacia.  - Tambin puede pasar por nuestra oficina durante el horario de atencin regular y Charity fundraiser una tarjeta de cupones de GoodRx.  - Si necesita que su receta se enve electrnicamente a una farmacia diferente, informe a nuestra oficina a travs de MyChart de [place] o por telfono llamando al [phone] y presione la opcin 4.   In 1 month Start 5-fluorouracil/calcipotriene cream twice a day for 7 days to affected areas including scalp. Prescription sent to [facility]. Patient provided with contact information for pharmacy and advised the pharmacy will mail the prescription to their home. Patient provided with handout reviewing treatment course and side effects and advised to call or message [place] on MyChart with any concerns.

## 2021-03-06 ENCOUNTER — Other Ambulatory Visit: Payer: Self-pay | Admitting: Family Medicine

## 2021-03-06 DIAGNOSIS — M5412 Radiculopathy, cervical region: Secondary | ICD-10-CM

## 2021-03-10 ENCOUNTER — Ambulatory Visit
Admission: RE | Admit: 2021-03-10 | Discharge: 2021-03-10 | Disposition: A | Discharge status: Home / Self Care | Payer: PPO | Source: Ambulatory Visit | Attending: Family Medicine | Admitting: Family Medicine

## 2021-03-10 ENCOUNTER — Encounter: Payer: Self-pay | Admitting: Dermatology

## 2021-03-10 ENCOUNTER — Other Ambulatory Visit: Payer: Self-pay

## 2021-03-10 DIAGNOSIS — M5412 Radiculopathy, cervical region: Secondary | ICD-10-CM | POA: Diagnosis not present

## 2021-03-10 DIAGNOSIS — M542 Cervicalgia: Secondary | ICD-10-CM | POA: Diagnosis not present

## 2021-03-10 IMAGING — MR MR CERVICAL SPINE W/O CM
5 series · 38 of 48 positions shown · non-contrast
Comparison: None.

CLINICAL DATA: Surgery approximately 15 years ago, decreased and
painful range of motion, neck pain, right hand stiffness

EXAM:
MRI CERVICAL SPINE WITHOUT CONTRAST
TECHNIQUE: Multiplanar, multisequence MR imaging of the cervical spine was
performed. No intravenous contrast was administered.

[Series 9: T2 · sagittal · 3.0mm · 0.62mm/px · 6 of 15 slices shown (1 of 2)]
[im 1/15]
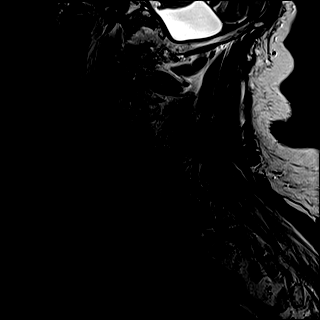
[im 3/15]
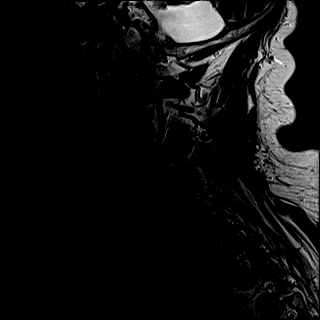
[im 6/15]
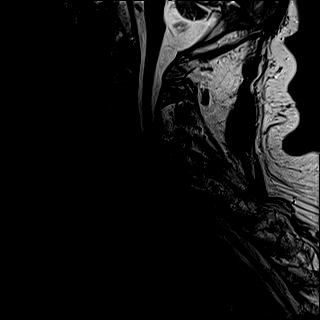
[im 9/15]
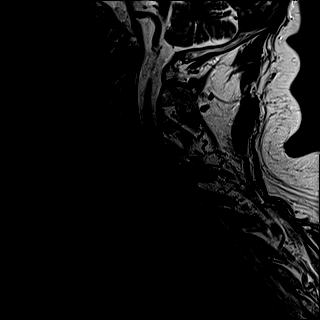
[im 12/15]
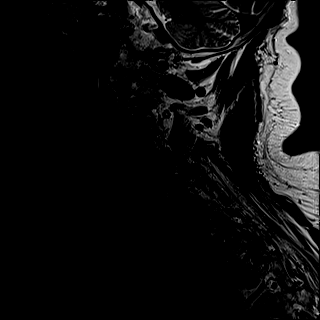
[im 15/15]
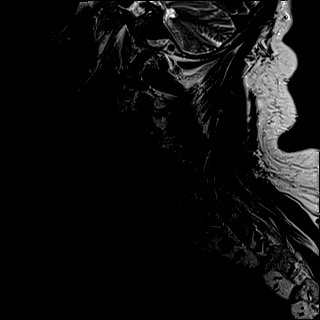

[Series 10: FLAIR · sagittal · 3.0mm · 0.78mm/px · 7 of 15 slices shown]
[im 1/15]
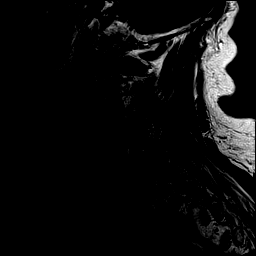
[im 3/15]
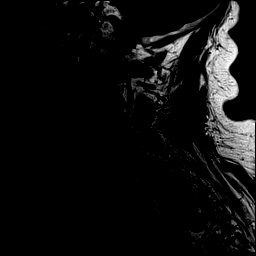
[im 5/15]
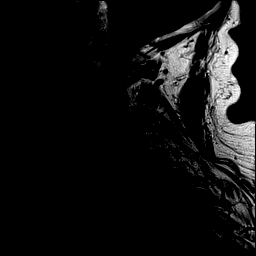
[im 8/15]
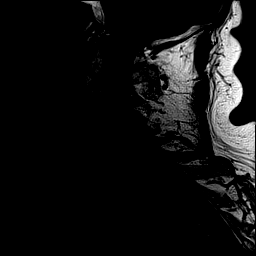
[im 10/15]
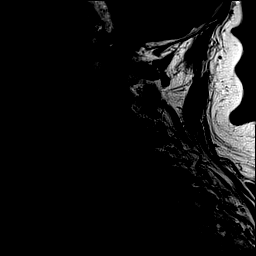
[im 12/15]
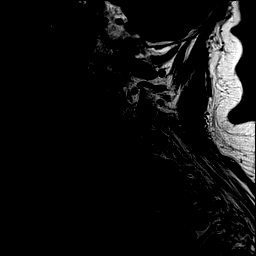
[im 15/15]
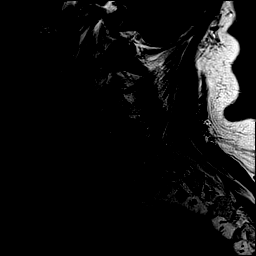

[Series 11: STIR · sagittal · 3.0mm · 0.62mm/px · 7 of 15 slices shown]
[im 1/15]
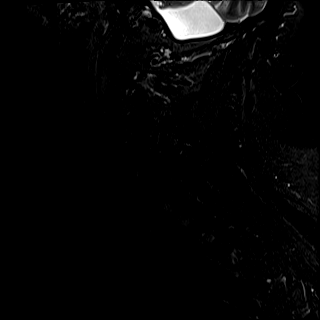
[im 3/15]
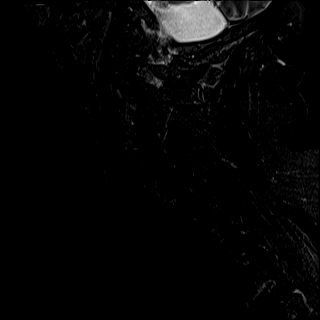
[im 5/15]
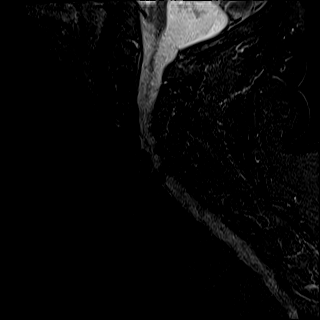
[im 8/15]
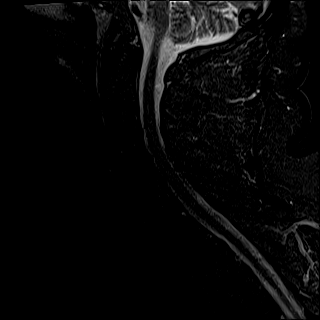
[im 10/15]
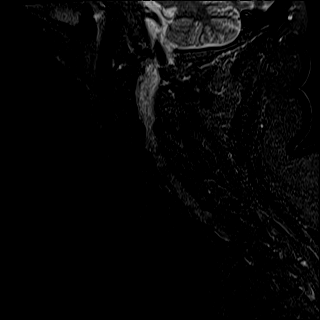
[im 12/15]
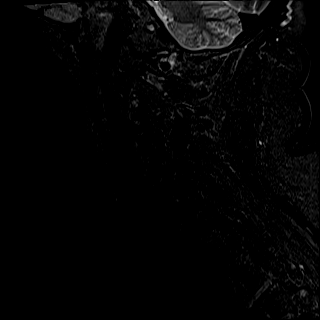
[im 15/15]
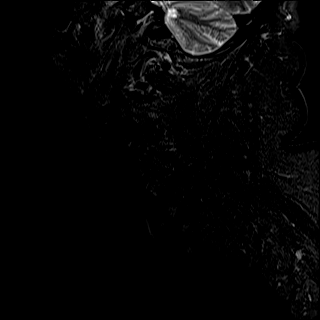

[Series 12: T2 · axial · 3.0mm · 0.70mm/px · z∈[-110,-6]mm · 10 of 31 slices shown (2 of 2)]
[im 1/31]
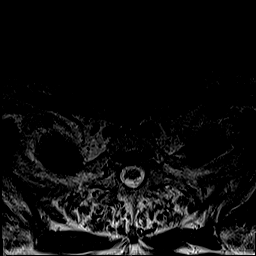
[im 3/31]
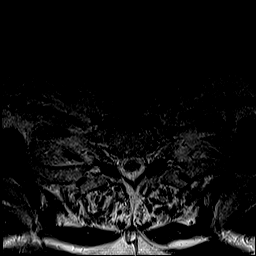
[im 5/31]
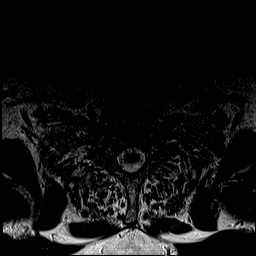
[im 7/31]
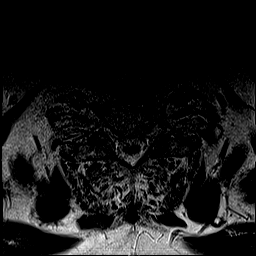
[im 10/31]
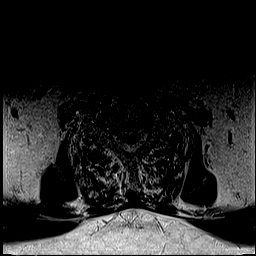
[im 14/31]
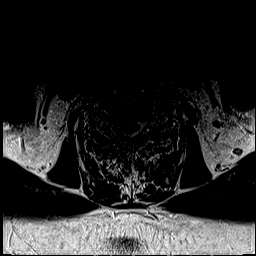
[im 17/31]
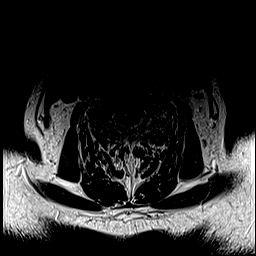
[im 21/31]
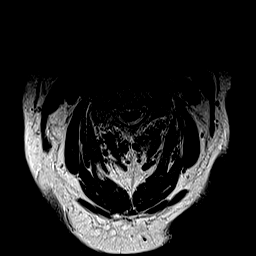
[im 26/31]
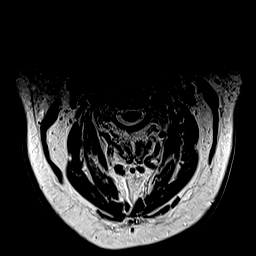
[im 31/31]
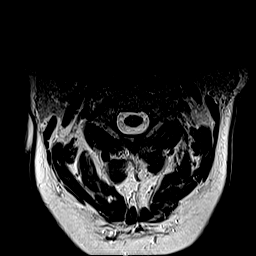

[Series 13: ax mpgr · axial · 3.0mm · 0.35mm/px · z∈[-110,-6]mm · 8 of 31 slices shown]
[im 1/31]
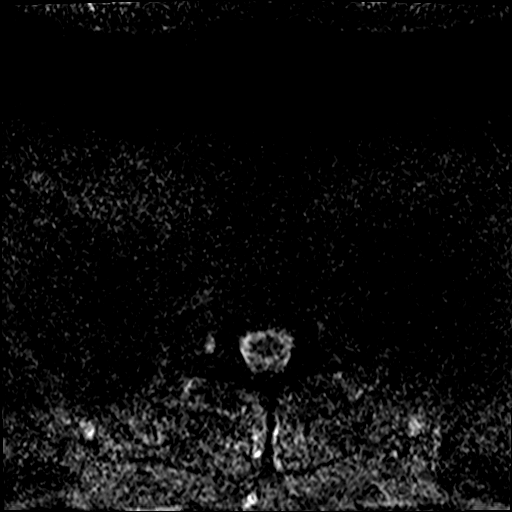
[im 5/31]
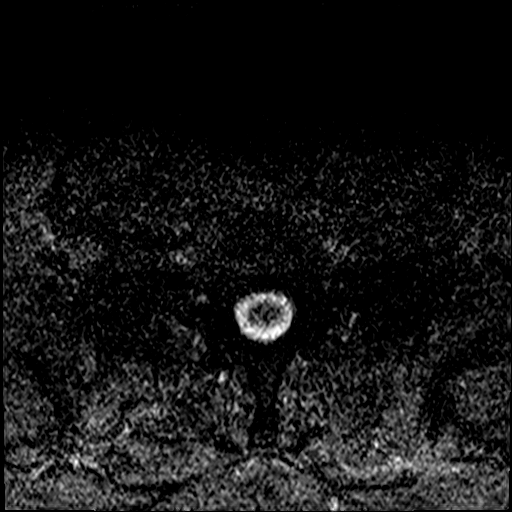
[im 10/31]
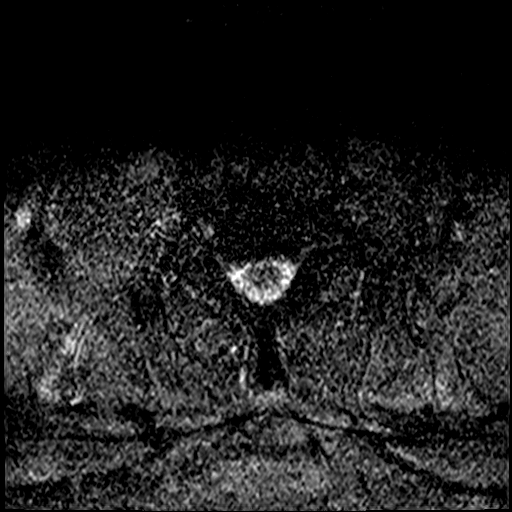
[im 14/31]
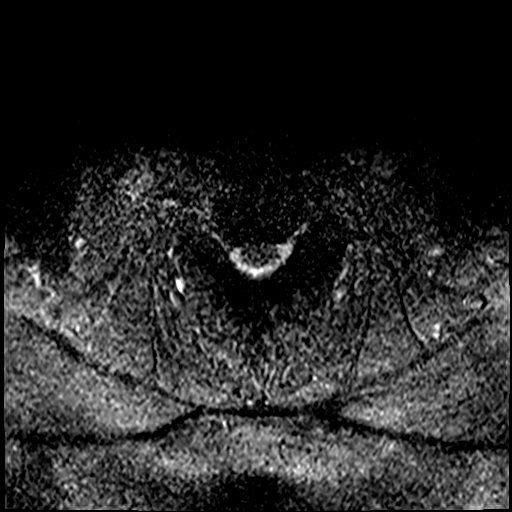
[im 17/31]
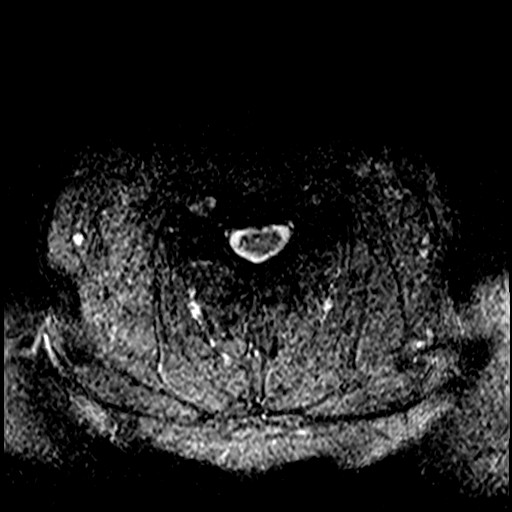
[im 21/31]
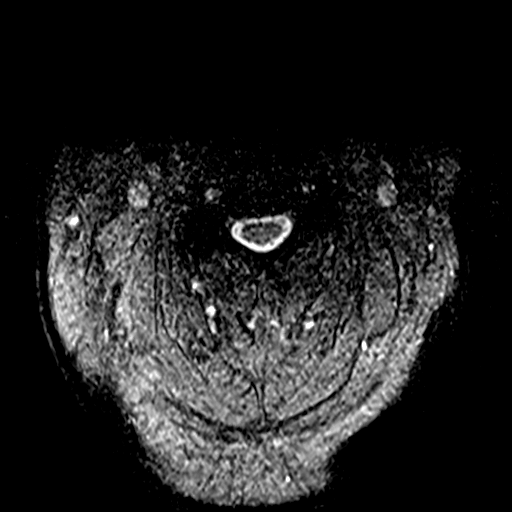
[im 26/31]
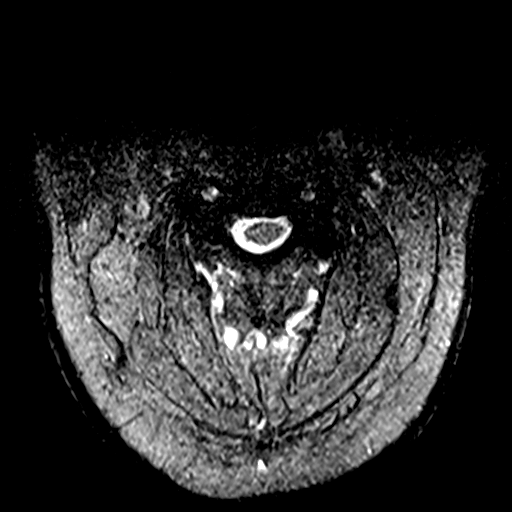
[im 31/31]
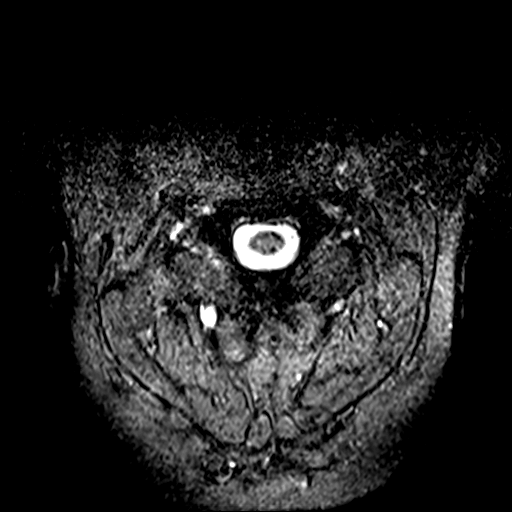

[38 of 48 positions shown; findings below may reference images not displayed]

FINDINGS: Evaluation of the neural foramina is limited by artifact,
particularly in the lower cervical spine on the axials.

Alignment: No significant listhesis.

Vertebrae: No acute fracture or suspicious osseous lesion. Fusion of
the C6 and C7 vertebral bodies.

Cord: Normal signal and morphology.

Posterior Fossa, vertebral arteries, paraspinal tissues: Left
posterior fossa arachnoid cyst. Otherwise negative.

Disc levels:

C2-C3: No significant disc bulge. Left-greater-than-right facet
arthropathy. No spinal canal stenosis. Mild left neural foraminal
narrowing.

C3-C4: No significant disc bulge. Right-greater-than-left
uncovertebral and facet arthropathy. No spinal canal stenosis.
Moderate right and mild left neural foraminal narrowing.

C4-C5: Mild disc bulge. Ligamentum flavum hypertrophy. Mild spinal
canal stenosis. Uncovertebral and facet arthropathy. Moderate
bilateral neural foraminal narrowing.

C5-C6: Mild disc bulge. Ligamentum flavum hypertrophy. Mild spinal
canal stenosis. Uncovertebral and facet arthropathy. Moderate
bilateral neural foraminal narrowing.

C6-C7: Osseous fusion across the disc space. Limited visualization.
No significant disc bulge. No spinal canal stenosis. No neural
foraminal narrowing.

C7-T1: No significant disc bulge. Right-greater-than-left facet and
uncovertebral arthropathy. No spinal canal stenosis. Mild right
neural foraminal narrowing.
IMPRESSION: 1. Evaluation of the neural foramina is limited by artifact,
particularly in the lower cervical spine. Within this limitation,
C4-C5 and C5-C6 mild spinal canal stenosis and moderate bilateral
neural foraminal narrowing.
2. C3-C4 moderate right and mild left neural foraminal narrowing.
3. Additional mild neural foraminal narrowing on the left at C2-C3
and C3-C4, and on the right at C7-T1.

## 2021-03-25 DIAGNOSIS — G8929 Other chronic pain: Secondary | ICD-10-CM | POA: Diagnosis not present

## 2021-03-25 DIAGNOSIS — M542 Cervicalgia: Secondary | ICD-10-CM | POA: Diagnosis not present

## 2021-03-25 DIAGNOSIS — R2689 Other abnormalities of gait and mobility: Secondary | ICD-10-CM | POA: Diagnosis not present

## 2021-03-25 DIAGNOSIS — M545 Low back pain, unspecified: Secondary | ICD-10-CM | POA: Diagnosis not present

## 2021-03-25 DIAGNOSIS — G629 Polyneuropathy, unspecified: Secondary | ICD-10-CM | POA: Diagnosis not present

## 2021-04-05 DIAGNOSIS — G4733 Obstructive sleep apnea (adult) (pediatric): Secondary | ICD-10-CM | POA: Diagnosis not present

## 2021-05-03 DIAGNOSIS — G4733 Obstructive sleep apnea (adult) (pediatric): Secondary | ICD-10-CM | POA: Diagnosis not present

## 2021-05-04 DIAGNOSIS — E119 Type 2 diabetes mellitus without complications: Secondary | ICD-10-CM | POA: Diagnosis not present

## 2021-05-04 DIAGNOSIS — I10 Essential (primary) hypertension: Secondary | ICD-10-CM | POA: Diagnosis not present

## 2021-05-04 DIAGNOSIS — E782 Mixed hyperlipidemia: Secondary | ICD-10-CM | POA: Diagnosis not present

## 2021-05-11 DIAGNOSIS — Z87891 Personal history of nicotine dependence: Secondary | ICD-10-CM | POA: Diagnosis not present

## 2021-05-11 DIAGNOSIS — E782 Mixed hyperlipidemia: Secondary | ICD-10-CM | POA: Diagnosis not present

## 2021-05-11 DIAGNOSIS — G629 Polyneuropathy, unspecified: Secondary | ICD-10-CM | POA: Diagnosis not present

## 2021-05-11 DIAGNOSIS — A6000 Herpesviral infection of urogenital system, unspecified: Secondary | ICD-10-CM | POA: Diagnosis not present

## 2021-05-11 DIAGNOSIS — Z1331 Encounter for screening for depression: Secondary | ICD-10-CM | POA: Diagnosis not present

## 2021-05-11 DIAGNOSIS — M5136 Other intervertebral disc degeneration, lumbar region: Secondary | ICD-10-CM | POA: Diagnosis not present

## 2021-05-11 DIAGNOSIS — C61 Malignant neoplasm of prostate: Secondary | ICD-10-CM | POA: Diagnosis not present

## 2021-05-11 DIAGNOSIS — I10 Essential (primary) hypertension: Secondary | ICD-10-CM | POA: Diagnosis not present

## 2021-05-11 DIAGNOSIS — K219 Gastro-esophageal reflux disease without esophagitis: Secondary | ICD-10-CM | POA: Diagnosis not present

## 2021-05-11 DIAGNOSIS — M47816 Spondylosis without myelopathy or radiculopathy, lumbar region: Secondary | ICD-10-CM | POA: Diagnosis not present

## 2021-05-11 DIAGNOSIS — I700 Atherosclerosis of aorta: Secondary | ICD-10-CM | POA: Diagnosis not present

## 2021-05-11 DIAGNOSIS — Z0000 Encounter for general adult medical examination without abnormal findings: Secondary | ICD-10-CM | POA: Diagnosis not present

## 2021-05-11 DIAGNOSIS — E119 Type 2 diabetes mellitus without complications: Secondary | ICD-10-CM | POA: Diagnosis not present

## 2021-06-03 DIAGNOSIS — G4733 Obstructive sleep apnea (adult) (pediatric): Secondary | ICD-10-CM | POA: Diagnosis not present

## 2021-06-08 ENCOUNTER — Ambulatory Visit: Payer: PPO | Admitting: Dermatology

## 2021-06-08 DIAGNOSIS — D2239 Melanocytic nevi of other parts of face: Secondary | ICD-10-CM

## 2021-06-08 DIAGNOSIS — L570 Actinic keratosis: Secondary | ICD-10-CM | POA: Diagnosis not present

## 2021-06-08 DIAGNOSIS — L578 Other skin changes due to chronic exposure to nonionizing radiation: Secondary | ICD-10-CM | POA: Diagnosis not present

## 2021-06-08 DIAGNOSIS — L821 Other seborrheic keratosis: Secondary | ICD-10-CM | POA: Diagnosis not present

## 2021-06-08 DIAGNOSIS — L820 Inflamed seborrheic keratosis: Secondary | ICD-10-CM

## 2021-06-08 DIAGNOSIS — D229 Melanocytic nevi, unspecified: Secondary | ICD-10-CM

## 2021-06-08 MED ORDER — FLUOROURACIL 5 % EX CREA: TOPICAL_CREAM | CUTANEOUS | 0 refills | Status: DC

## 2021-06-08 NOTE — Patient Instructions (Addendum)
Start 5-fluorouracil/calcipotriene cream twice a day for 7 days to affected areas including the scalp. Prescription sent to [facility]. Patient provided with contact information for pharmacy and advised the pharmacy will mail the prescription to their home. Patient provided with handout reviewing treatment course and side effects and advised to call or message Jason Washington on MyChart with any concerns. ? ? ?5-fluorouracil/calcipotriene cream is is a type of field treatment used to treat precancers, thin skin cancers, and areas of sun damage. Expected reaction includes irritation and mild inflammation potentially progressing to more severe inflammation including redness, scaling, crusting and open sores/erosions.  If too much irritation occurs, ensure application of only a thin layer and decrease frequency of use to achieve a tolerable level of inflammation. Recommend applying Vaseline ointment to open sores as needed.  Minimize sun exposure while under treatment. Recommend daily broad spectrum sunscreen SPF 30+ to sun-exposed areas, reapply every 2 hours as needed.  ? ?  ?5-Fluorouracil/Calcipotriene Patient Education  ? ?Actinic keratoses are the dry, red scaly spots on the skin caused by sun damage. A portion of these spots can turn into skin cancer with time, and treating them can help prevent development of skin cancer.  ? ?Treatment of these spots requires removal of the defective skin cells. There are various ways to remove actinic keratoses, including freezing with liquid nitrogen, treatment with creams, or treatment with a blue light procedure in the office.  ? ?5-fluorouracil cream is a topical cream used to treat actinic keratoses. It works by interfering with the growth of abnormal fast-growing skin cells, such as actinic keratoses. These cells peel off and are replaced by healthy ones.  ? ?5-fluorouracil/calcipotriene is a combination of the 5-fluorouracil cream with a vitamin D analog cream called  calcipotriene. The calcipotriene alone does not treat actinic keratoses. However, when it is combined with 5-fluorouracil, it helps the 5-fluorouracil treat the actinic keratoses much faster so that the same results can be achieved with a much shorter treatment time. ? ?INSTRUCTIONS FOR 5-FLUOROURACIL/CALCIPOTRIENE CREAM:  ? ?5-fluorouracil/calcipotriene cream typically only needs to be used for 4-7 days. A thin layer should be applied twice a day to the treatment areas recommended by your physician.  ? ?If your physician prescribed you separate tubes of 5-fluourouracil and calcipotriene, apply a thin layer of 5-fluorouracil followed by a thin layer of calcipotriene.  ? ?Avoid contact with your eyes, nostrils, and mouth. Do not use 5-fluorouracil/calcipotriene cream on infected or open wounds.  ? ?You will develop redness, irritation and some crusting at areas where you have pre-cancer damage/actinic keratoses. IF YOU DEVELOP PAIN, BLEEDING, OR SIGNIFICANT CRUSTING, STOP THE TREATMENT EARLY - you have already gotten a good response and the actinic keratoses should clear up well. ? ?Wash your hands after applying 5-fluorouracil 5% cream on your skin.  ? ?A moisturizer or sunscreen with a minimum SPF 30 should be applied each morning.  ? ?Once you have finished the treatment, you can apply a thin layer of Vaseline twice a day to irritated areas to soothe and calm the areas more quickly. If you experience significant discomfort, contact your physician. ? ?For some patients it is necessary to repeat the treatment for best results. ? ?SIDE EFFECTS: When using 5-fluorouracil/calcipotriene cream, you may have mild irritation, such as redness, dryness, swelling, or a mild burning sensation. This usually resolves within 2 weeks. The more actinic keratoses you have, the more redness and inflammation you can expect during treatment. Eye irritation has been reported rarely. If  this occurs, please let Jason Washington know.  ? ?If you have  any trouble using this cream, please call the office. If you have any other questions about this information, please do not hesitate to ask me before you leave the office. ? ? ? ?If You Need Anything After Your Visit ? ?If you have any questions or concerns for your doctor, please call our main line at [phone] and press option 4 to reach your doctor's medical assistant. If no one answers, please leave a voicemail as directed and we will return your call as soon as possible. Messages left after 4 pm will be answered the following business day.  ? ?You may also send Jason Washington a message via MyChart. We typically respond to MyChart messages within 1-2 business days. ? ?For prescription refills, please ask your pharmacy to contact our office. Our fax number is [phone]. ? ?If you have an urgent issue when the clinic is closed that cannot wait until the next business day, you can page your doctor at the number below.   ? ?Please note that while we do our best to be available for urgent issues outside of office hours, we are not available 24/7.  ? ?If you have an urgent issue and are unable to reach Jason Washington, you may choose to seek medical care at your doctor's office, retail clinic, urgent care center, or emergency room. ? ?If you have a medical emergency, please immediately call 911 or go to the emergency department. ? ?Pager Numbers ? ?- Dr. Nehemiah Massed: [phone] ? ?- Dr. Laurence Ferrari: [phone] ? ?- Dr. Nicole Kindred: [phone] ? ?In the event of inclement weather, please call our main line at [phone] for an update on the status of any delays or closures. ? ?Dermatology Medication Tips: ?Please keep the boxes that topical medications come in in order to help keep track of the instructions about where and how to use these. Pharmacies typically print the medication instructions only on the boxes and not directly on the medication tubes.  ? ?If your medication is too expensive, please contact our office at [phone] option 4  or send Jason Washington a message through [place].  ? ?We are unable to tell what your co-pay for medications will be in advance as this is different depending on your insurance coverage. However, we may be able to find a substitute medication at lower cost or fill out paperwork to get insurance to cover a needed medication.  ? ?If a prior authorization is required to get your medication covered by your insurance company, please allow Jason Washington 1-2 business days to complete this process. ? ?Drug prices often vary depending on where the prescription is filled and some pharmacies may offer cheaper prices. ? ?The website www.goodrx.com contains coupons for medications through different pharmacies. The prices here do not account for what the cost may be with help from insurance (it may be cheaper with your insurance), but the website can give you the price if you did not use any insurance.  ?- You can print the associated coupon and take it with your prescription to the pharmacy.  ?- You may also stop by our office during regular business hours and pick up a GoodRx coupon card.  ?- If you need your prescription sent electronically to a different pharmacy, notify our office through [facility] or by phone at [phone] option 4. ? ? ? ? ?Si Usted Necesita Algo Despu?s de Su Visita ? ?Tambi?n puede enviarnos un mensaje a trav?s de MyChart. Por  lo general respondemos a los mensajes de MyChart en el transcurso de 1 a 2 d?as h?biles. ? ?Para renovar recetas, por favor pida a su farmacia que se ponga en contacto con nuestra oficina. Nuestro n?mero de fax es el [phone]. ? ?Si tiene un asunto urgente cuando la cl?nica est? cerrada y que no puede esperar hasta el siguiente d?a h?bil, puede llamar/localizar a su doctor(a) al n?mero que aparece a continuaci?n.  ? ?Por favor, tenga en cuenta que aunque hacemos todo lo posible para estar disponibles para asuntos urgentes fuera del horario de oficina, no estamos disponibles las 24 horas  del d?a, los 7 d?as de la semana.  ? ?Si tiene un problema urgente y no puede comunicarse con nosotros, puede optar por buscar atenci?n m?dica  en el consultorio de su doctor(a), en una cl?nica privada, en un c

## 2021-06-08 NOTE — Progress Notes (Signed)
? ?Follow-Up Visit ?  ?Subjective  ?Jason Washington is a 76 y.o. male who presents for the following: Actinic Keratosis (Of the scalp - S/P LN2 and 5FU/Calcipotriene mix BID x 1 week, patient is here today to check for new or persistent skin lesions on the face and scalp.).  ?The patient has spots, moles and lesions to be evaluated, some may be new or changing and the patient has concerns that these could be cancer. ? ?The following portions of the chart were reviewed this encounter and updated as appropriate:  ? Tobacco  Allergies  Meds  Problems  Med Hx  Surg Hx  Fam Hx   ?  ?Review of Systems:  No other skin or systemic complaints except as noted in HPI or Assessment and Plan. ? ?Objective  ?Well appearing patient in no apparent distress; mood and affect are within normal limits. ? ?A focused examination was performed including the face, ears, and scalp. Relevant physical exam findings are noted in the Assessment and Plan. ? ?Scalp x 16 (16) ?Erythematous thin papules/macules with gritty scale.  ? ?L chin ?Flesh colored papule. ? ?R sideburn x 2, L sideburn/temple x 3 (5) ?Erythematous stuck-on, waxy papule or plaque ? ? ?Assessment & Plan  ?AK (actinic keratosis) (16) ?Scalp x 16 ?Destruction of lesion - Scalp x 16 ?Complexity: simple   ?Destruction method: cryotherapy   ?Informed consent: discussed and consent obtained   ?Timeout:  patient name, date of birth, surgical site, and procedure verified ?Lesion destroyed using liquid nitrogen: Yes   ?Region frozen until ice ball extended beyond lesion: Yes   ?Outcome: patient tolerated procedure well with no complications   ?Post-procedure details: wound care instructions given   ? ?fluorouracil (EFUDEX) 5 % cream - Scalp x 16 ?In one month (May 22nd) apply to the scalp BID x 1 week. ? ?Nevus ?L chin ?Benign-appearing.  Observation.  Call clinic for new or changing lesions.  Recommend daily use of broad spectrum spf 30+ sunscreen to sun-exposed areas.   ? ?Inflamed seborrheic keratosis (5) ?R sideburn x 2, L sideburn/temple x 3 ?Irritated ?Destruction of lesion - R sideburn x 2, L sideburn/temple x 3 ?Complexity: simple   ?Destruction method: cryotherapy   ?Informed consent: discussed and consent obtained   ?Timeout:  patient name, date of birth, surgical site, and procedure verified ?Lesion destroyed using liquid nitrogen: Yes   ?Region frozen until ice ball extended beyond lesion: Yes   ?Outcome: patient tolerated procedure well with no complications   ?Post-procedure details: wound care instructions given   ? ?Actinic Damage - Severe, confluent actinic changes with pre-cancerous actinic keratoses  ?- Severe, chronic, not at goal, secondary to cumulative UV radiation exposure over time ?- diffuse scaly erythematous macules and papules with underlying dyspigmentation ?- Discussed Prescription "Field Treatment" for Severe, Chronic Confluent Actinic Changes with Pre-Cancerous Actinic Keratoses ?Field treatment involves treatment of an entire area of skin that has confluent Actinic Changes (Sun/ Ultraviolet light damage) and PreCancerous Actinic Keratoses by method of PhotoDynamic Therapy (PDT) and/or prescription Topical Chemotherapy agents such as 5-fluorouracil, 5-fluorouracil/calcipotriene, and/or imiquimod.  The purpose is to decrease the number of clinically evident and subclinical PreCancerous lesions to prevent progression to development of skin cancer by chemically destroying early precancer changes that may or may not be visible.  It has been shown to reduce the risk of developing skin cancer in the treated area. As a result of treatment, redness, scaling, crusting, and open sores may occur during  treatment course. One or more than one of these methods may be used and may have to be used several times to control, suppress and eliminate the PreCancerous changes. Discussed treatment course, expected reaction, and possible side effects. ?- Recommend daily  broad spectrum sunscreen SPF 30+ to sun-exposed areas, reapply every 2 hours as needed.  ?- Staying in the shade or wearing long sleeves, sun glasses (UVA+UVB protection) and wide brim hats (4-inch brim around the entire circumference of the hat) are also recommended. ?- Call for new or changing lesions. ?- In one month restart 5FU/Calcipotriene mix BID x 1 week.  ? ?Seborrheic Keratoses ?- Stuck-on, waxy, tan-brown papules and/or plaques  ?- Benign-appearing ?- Discussed benign etiology and prognosis. ?- Observe ?- Call for any changes ? ?Return in about 6 months (around 12/08/2021) for TBSE. ? ?I, Rudell Cobb, CMA, am acting as scribe for Sarina Ser, MD . ?Documentation: I have reviewed the above documentation for accuracy and completeness, and I agree with the above. ? ?Sarina Ser, MD ? ?

## 2021-06-16 ENCOUNTER — Encounter: Payer: Self-pay | Admitting: Dermatology

## 2021-07-16 ENCOUNTER — Telehealth: Payer: Self-pay | Admitting: Internal Medicine

## 2021-07-16 DIAGNOSIS — G4733 Obstructive sleep apnea (adult) (pediatric): Secondary | ICD-10-CM

## 2021-07-16 NOTE — Telephone Encounter (Addendum)
Patient last seen 12/18/2019. No pending appt.  Spoke to patient. He stated that he take wore bipap 2 mo ago. He was unable to tolerate machine. He would like order placed to [facility] medical supply to d/c bipap.  Dr. Melvyn Novas, please advise. Thanks

## 2021-07-20 NOTE — Telephone Encounter (Signed)
Dr. Kasa, please advise. Thanks °

## 2021-07-20 NOTE — Telephone Encounter (Signed)
Dr. Wert, please advise. Thanks!  

## 2021-07-20 NOTE — Telephone Encounter (Signed)
Whoever prescribed it  [place] or [place] ?  Can be the one to d/c it since this is not an urgent issue

## 2021-07-22 NOTE — Telephone Encounter (Signed)
Order placed to [facility] to d/c bipap.  Lm for patient.

## 2021-07-23 NOTE — Telephone Encounter (Signed)
Patient is aware of below message and voiced his understanding.  Nothing further needed.   

## 2021-07-31 DIAGNOSIS — R195 Other fecal abnormalities: Secondary | ICD-10-CM | POA: Diagnosis not present

## 2021-08-05 DIAGNOSIS — R195 Other fecal abnormalities: Secondary | ICD-10-CM | POA: Diagnosis not present

## 2021-08-06 DIAGNOSIS — R195 Other fecal abnormalities: Secondary | ICD-10-CM | POA: Diagnosis not present

## 2021-08-21 DIAGNOSIS — R195 Other fecal abnormalities: Secondary | ICD-10-CM | POA: Diagnosis not present

## 2021-08-21 DIAGNOSIS — R413 Other amnesia: Secondary | ICD-10-CM | POA: Diagnosis not present

## 2021-08-21 DIAGNOSIS — E876 Hypokalemia: Secondary | ICD-10-CM | POA: Diagnosis not present

## 2021-09-03 DIAGNOSIS — R197 Diarrhea, unspecified: Secondary | ICD-10-CM | POA: Diagnosis not present

## 2021-09-03 DIAGNOSIS — Z86010 Personal history of colonic polyps: Secondary | ICD-10-CM | POA: Diagnosis not present

## 2021-09-03 DIAGNOSIS — R194 Change in bowel habit: Secondary | ICD-10-CM | POA: Diagnosis not present

## 2021-10-07 ENCOUNTER — Ambulatory Visit
Admission: RE | Admit: 2021-10-07 | Discharge: 2021-10-07 | Disposition: A | Discharge status: Home / Self Care | Payer: PPO | Attending: Internal Medicine | Admitting: Internal Medicine

## 2021-10-07 ENCOUNTER — Ambulatory Visit: Payer: PPO | Admitting: Anesthesiology

## 2021-10-07 ENCOUNTER — Encounter
Admission: RE | Disposition: A | Discharge status: Home / Self Care | Payer: Self-pay | Source: Home / Self Care | Attending: Internal Medicine

## 2021-10-07 DIAGNOSIS — K591 Functional diarrhea: Secondary | ICD-10-CM | POA: Diagnosis not present

## 2021-10-07 DIAGNOSIS — I10 Essential (primary) hypertension: Secondary | ICD-10-CM | POA: Insufficient documentation

## 2021-10-07 DIAGNOSIS — K52832 Lymphocytic colitis: Secondary | ICD-10-CM | POA: Insufficient documentation

## 2021-10-07 DIAGNOSIS — K640 First degree hemorrhoids: Secondary | ICD-10-CM | POA: Insufficient documentation

## 2021-10-07 DIAGNOSIS — D128 Benign neoplasm of rectum: Secondary | ICD-10-CM | POA: Diagnosis not present

## 2021-10-07 DIAGNOSIS — F17200 Nicotine dependence, unspecified, uncomplicated: Secondary | ICD-10-CM | POA: Insufficient documentation

## 2021-10-07 DIAGNOSIS — Z79899 Other long term (current) drug therapy: Secondary | ICD-10-CM | POA: Diagnosis not present

## 2021-10-07 DIAGNOSIS — K621 Rectal polyp: Secondary | ICD-10-CM | POA: Diagnosis not present

## 2021-10-07 DIAGNOSIS — K6289 Other specified diseases of anus and rectum: Secondary | ICD-10-CM | POA: Insufficient documentation

## 2021-10-07 DIAGNOSIS — Z86010 Personal history of colonic polyps: Secondary | ICD-10-CM | POA: Diagnosis not present

## 2021-10-07 DIAGNOSIS — K5140 Inflammatory polyps of colon without complications: Secondary | ICD-10-CM | POA: Diagnosis not present

## 2021-10-07 DIAGNOSIS — K5730 Diverticulosis of large intestine without perforation or abscess without bleeding: Secondary | ICD-10-CM | POA: Insufficient documentation

## 2021-10-07 DIAGNOSIS — K648 Other hemorrhoids: Secondary | ICD-10-CM | POA: Diagnosis not present

## 2021-10-07 DIAGNOSIS — K219 Gastro-esophageal reflux disease without esophagitis: Secondary | ICD-10-CM | POA: Insufficient documentation

## 2021-10-07 DIAGNOSIS — K5289 Other specified noninfective gastroenteritis and colitis: Secondary | ICD-10-CM | POA: Diagnosis not present

## 2021-10-07 HISTORY — PX: COLONOSCOPY WITH PROPOFOL: SHX5780

## 2021-10-07 SURGERY — COLONOSCOPY WITH PROPOFOL
Anesthesia: General

## 2021-10-07 MED ORDER — PROPOFOL 10 MG/ML IV BOLUS
INTRAVENOUS | Status: DC | PRN
  Administered 2021-10-07: 80 mg via INTRAVENOUS

## 2021-10-07 MED ORDER — PHENYLEPHRINE HCL (PRESSORS) 10 MG/ML IV SOLN
INTRAVENOUS | Status: DC | PRN
  Administered 2021-10-07: 80 ug via INTRAVENOUS

## 2021-10-07 MED ORDER — SODIUM CHLORIDE 0.9 % IV SOLN
INTRAVENOUS | Status: DC
  Administered 2021-10-07: 20 mL/h via INTRAVENOUS

## 2021-10-07 MED ORDER — PROPOFOL 500 MG/50ML IV EMUL
INTRAVENOUS | Status: DC | PRN
  Administered 2021-10-07: 200 ug/kg/min via INTRAVENOUS

## 2021-10-07 NOTE — Anesthesia Preprocedure Evaluation (Signed)
Anesthesia Evaluation  Patient identified by MRN, date of birth, ID band Patient awake    Reviewed: Allergy & Precautions, NPO status , Patient's Chart, lab work & pertinent test results  History of Anesthesia Complications Negative for: history of anesthetic complications  Airway Mallampati: II  TM Distance: >3 FB Neck ROM: Full    Dental no notable dental hx. (+) Teeth Intact   Pulmonary neg sleep apnea, neg COPD, Current Smoker and Patient abstained from smoking.,    Pulmonary exam normal breath sounds clear to auscultation       Cardiovascular Exercise Tolerance: Good METShypertension, Pt. on medications (-) CAD and (-) Past MI (-) dysrhythmias  Rhythm:Regular Rate:Normal - Systolic murmurs    Neuro/Psych negative neurological ROS  negative psych ROS   GI/Hepatic GERD  ,(+)     (-) substance abuse  ,   Endo/Other  neg diabetes  Renal/GU negative Renal ROS     Musculoskeletal   Abdominal   Peds  Hematology   Anesthesia Other Findings Past Medical History: 11/21/2019: Basal cell carcinoma     Comment:  right superior lat pectoral/ EDC No date: Chronic GERD No date: Genital herpes No date: Hypertension No date: Melanoma ([place])     Comment:  R knee several years ago  No date: Prostate CA ([place])  Reproductive/Obstetrics                             Anesthesia Physical Anesthesia Plan  ASA: 2  Anesthesia Plan: General   Post-op Pain Management: Minimal or no pain anticipated   Induction: Intravenous  PONV Risk Score and Plan: 1 and Propofol infusion, TIVA and Ondansetron  Airway Management Planned: Nasal Cannula  Additional Equipment: None  Intra-op Plan:   Post-operative Plan:   Informed Consent: I have reviewed the patients History and Physical, chart, labs and discussed the procedure including the risks, benefits and alternatives for the proposed anesthesia with the  patient or authorized representative who has indicated his/her understanding and acceptance.     Dental advisory given  Plan Discussed with: CRNA and Surgeon  Anesthesia Plan Comments: (Discussed risks of anesthesia with patient, including possibility of difficulty with spontaneous ventilation under anesthesia necessitating airway intervention, PONV, and rare risks such as cardiac or respiratory or neurological events, and allergic reactions. Discussed the role of CRNA in patient's perioperative care. Patient understands. Patient counseled on benefits of smoking cessation, and increased perioperative risks associated with continued smoking. )        Anesthesia Quick Evaluation

## 2021-10-07 NOTE — Op Note (Signed)
[facility] Gastroenterology Patient Name: Jason Washington Procedure Date: 10/07/2021 10:41 AM MRN: [id number] Account #: [id number] Date of Birth: 1945/04/17 Admit Type: Outpatient Age: 76 Room: [facility] ENDO ROOM 2 Gender: Male Note Status: Finalized Instrument Name: Jasper Riling [id number] Procedure:             Colonoscopy Indications:           Functional diarrhea Providers:             Benay Pike. Alice Reichert MD, MD Referring MD:          Andrey Farmer MD, MD (Referring MD) Medicines:             Propofol per Anesthesia Complications:         No immediate complications. Estimated blood loss:                         Minimal. Procedure:             Pre-Anesthesia Assessment:                        - The risks and benefits of the procedure and the                         sedation options and risks were discussed with the                         patient. All questions were answered and informed                         consent was obtained.                        - Patient identification and proposed procedure were                         verified prior to the procedure by the nurse. The                         procedure was verified in the procedure room.                        - ASA Grade Assessment: III - A patient with severe                         systemic disease.                        - After reviewing the risks and benefits, the patient                         was deemed in satisfactory condition to undergo the                         procedure.                        After obtaining informed consent, the colonoscope was                         passed under direct vision. Throughout  the procedure,                         the patient's blood pressure, pulse, and oxygen                         saturations were monitored continuously. The                         Colonoscope was introduced through the anus and                         advanced to the the cecum,  identified by appendiceal                         orifice and ileocecal valve. The colonoscopy was                         performed without difficulty. The patient tolerated                         the procedure well. The quality of the bowel                         preparation was adequate. The ileocecal valve,                         appendiceal orifice, and rectum were photographed. Findings:      The perianal and digital rectal examinations were normal. Pertinent       negatives include normal sphincter tone and no palpable rectal lesions.      Non-bleeding internal hemorrhoids were found during retroflexion. The       hemorrhoids were Grade I (internal hemorrhoids that do not prolapse).      Many small-mouthed diverticula were found in the sigmoid colon.      A 8 mm polyp was found in the rectum. The polyp was semi-pedunculated.       The polyp was removed with a hot snare. Resection and retrieval were       complete.      Localized mild inflammation characterized by congestion (edema),       erythema and loss of vascularity was found in the rectum. Biopsies were       taken with a cold forceps for histology.      The exam was otherwise without abnormality.      Biopsies for histology were taken with a cold forceps from the random       colon for evaluation of microscopic colitis. Impression:            - Non-bleeding internal hemorrhoids.                        - Diverticulosis in the sigmoid colon.                        - One 8 mm polyp in the rectum, removed with a hot                         snare. Resected and retrieved.                        -  Localized mild inflammation was found in the rectum                         secondary to proctitis. Biopsied.                        - The examination was otherwise normal.                        - Biopsies were taken with a cold forceps from the                         random colon for evaluation of microscopic  colitis. Recommendation:        - Patient has a contact number available for                         emergencies. The signs and symptoms of potential                         delayed complications were discussed with the patient.                         Return to normal activities tomorrow. Written                         discharge instructions were provided to the patient.                        - Resume previous diet.                        - Continue present medications.                        - If polyps are benign or adenomatous without                         dysplasia, I will advise NO further colonoscopy due to                         advanced age and/or severe comorbidity.                        - Await pathology results.                        - Return to GI office at the next available                         appointment.                        - Telephone GI office to schedule appointment.                        - The findings and recommendations were discussed with                         the patient. Procedure Code(s):     --- Professional ---  45385, Colonoscopy, flexible; with removal of                         tumor(s), polyp(s), or other lesion(s) by snare                         technique                        45380, 59, Colonoscopy, flexible; with biopsy, single                         or multiple Diagnosis Code(s):     --- Professional ---                        K57.30, Diverticulosis of large intestine without                         perforation or abscess without bleeding                        K59.1, Functional diarrhea                        K62.89, Other specified diseases of anus and rectum                        K62.1, Rectal polyp                        K64.0, First degree hemorrhoids CPT copyright 2019 American Medical Association. All rights reserved. The codes documented in this report are preliminary and upon coder review may  be  revised to meet current compliance requirements. Efrain Sella MD, MD 10/07/2021 11:08:22 AM This report has been signed electronically. Number of Addenda: 0 Note Initiated On: 10/07/2021 10:41 AM Scope Withdrawal Time: 0 hours 9 minutes 4 seconds  Total Procedure Duration: 0 hours 13 minutes 1 second  Estimated Blood Loss:  Estimated blood loss was minimal.      [facility]

## 2021-10-07 NOTE — Interval H&P Note (Signed)
History and Physical Interval Note:  10/07/2021 10:35 AM  Jason Washington  has presented today for surgery, with the diagnosis of BOWEL HABIT CHANGES,DIARRHEA,HX OF ADENOMATOUS POLYP OF COLON.  The various methods of treatment have been discussed with the patient and family. After consideration of risks, benefits and other options for treatment, the patient has consented to  Procedure(s): COLONOSCOPY WITH PROPOFOL (N/A) as a surgical intervention.  The patient's history has been reviewed, patient examined, no change in status, stable for surgery.  I have reviewed the patient's chart and labs.  Questions were answered to the patient's satisfaction.     [place], [place]

## 2021-10-07 NOTE — Transfer of Care (Signed)
Immediate Anesthesia Transfer of Care Note  Patient: Jason Washington  Procedure(s) Performed: COLONOSCOPY WITH PROPOFOL  Patient Location: PACU  Anesthesia Type:General  Level of Consciousness: sedated  Airway & Oxygen Therapy: Patient Spontanous Breathing  Post-op Assessment: Report given to RN and Post -op Vital signs reviewed and stable  Post vital signs: Reviewed and stable  Last Vitals:  Vitals Value Taken Time  BP 123/59 10/07/21 1108  Temp 36.1 C 10/07/21 1107  Pulse 70 10/07/21 1109  Resp 16 10/07/21 1109  SpO2 95 % 10/07/21 1109  Vitals shown include unvalidated device data.  Last Pain:  Vitals:   10/07/21 1107  TempSrc: Temporal  PainSc:          Complications: No notable events documented.

## 2021-10-07 NOTE — Anesthesia Postprocedure Evaluation (Signed)
Anesthesia Post Note  Patient: Jason Washington  Procedure(s) Performed: COLONOSCOPY WITH PROPOFOL  Patient location during evaluation: Endoscopy Anesthesia Type: General Level of consciousness: awake and alert Pain management: pain level controlled Vital Signs Assessment: post-procedure vital signs reviewed and stable Respiratory status: spontaneous breathing, nonlabored ventilation, respiratory function stable and patient connected to nasal cannula oxygen Cardiovascular status: blood pressure returned to baseline and stable Postop Assessment: no apparent nausea or vomiting Anesthetic complications: no   No notable events documented.   Last Vitals:  Vitals:   10/07/21 1107 10/07/21 1117  BP: (!) 123/59 (!) 154/79  Pulse:    Resp:    Temp: (!) 36.1 C   SpO2: 95%     Last Pain:  Vitals:   10/07/21 1117  TempSrc:   PainSc: 0-No pain                 Arita Miss

## 2021-10-07 NOTE — Anesthesia Procedure Notes (Signed)
Date/Time: 10/07/2021 10:44 AM  Performed by: Nelda Marseille, CRNAPre-anesthesia Checklist: Patient identified, Emergency Drugs available, Suction available, Patient being monitored and Timeout performed Oxygen Delivery Method: Nasal cannula

## 2021-10-07 NOTE — H&P (Signed)
Outpatient short stay form Pre-procedure 10/07/2021 10:34 AM Jason Washington K. Alice Reichert, M.D.  Primary Physician: Jason Denis, PA-C  Reason for visit:  change in bowel habits, hx of adenomatous colon polyps  History of present illness:  [name] is a 76 y.o. male presenting for consultation for loose stools with urgency and incontinence/history of adenomatous colon polyps/bowel habit changes-    Current Facility-Administered Medications:    0.9 %  sodium chloride infusion, , Intravenous, Continuous, Jason Washington, Benay Pike, MD, Last Rate: 20 mL/hr at 10/07/21 0954, 20 mL/hr at 10/07/21 0954  Medications Prior to Admission  Medication Sig Dispense Refill Last Dose   albuterol (VENTOLIN HFA) 108 (90 Base) MCG/ACT inhaler INHALE 2 PUFFS INTO THE LUNGS EVERY 4 (FOUR) HOURS AS NEEDED FOR WHEEZING OR SHORTNESS OF BREATH. 8.5 each 10 Past Week   amLODipine (NORVASC) 5 MG tablet amlodipine 5 mg tablet   Past Week   atorvastatin (LIPITOR) 40 MG tablet atorvastatin 40 mg tablet   Past Week   etodolac (LODINE) 300 MG capsule etodolac 300 mg capsule   Past Week   fluorouracil (EFUDEX) 5 % cream In one month (May 22nd) apply to the scalp BID x 1 week. 15 g 0 Past Week   hydrochlorothiazide (HYDRODIURIL) 25 MG tablet hydrochlorothiazide 25 mg tablet   Past Week   lisinopril (ZESTRIL) 40 MG tablet lisinopril 40 mg tablet   Past Week   meloxicam (MOBIC) 15 MG tablet meloxicam 15 mg tablet   Past Week   omeprazole (PRILOSEC) 40 MG capsule omeprazole 40 mg capsule,delayed release   Past Week   acyclovir (ZOVIRAX) 400 MG tablet Take 400 mg by mouth 2 (two) times daily. (Patient not taking: Reported on 06/08/2021)        No Known Allergies   Past Medical History:  Diagnosis Date   Basal cell carcinoma 11/21/2019   right superior lat pectoral/ EDC   Chronic GERD    Genital herpes    Hypertension    Melanoma ([place])    R knee several years ago    Prostate CA [facility])     Review of systems:  Otherwise  negative.    Physical Exam  Gen: Alert, oriented. Appears stated age.  HEENT: [place]/AT. PERRLA. Lungs: CTA, no wheezes. CV: RR nl S1, S2. Abd: soft, benign, no masses. BS+ Ext: No edema. Pulses 2+    Planned procedures: Proceed with colonoscopy. The patient understands the nature of the planned procedure, indications, risks, alternatives and potential complications including but not limited to bleeding, infection, perforation, damage to internal organs and possible oversedation/side effects from anesthesia. The patient agrees and gives consent to proceed.  Please refer to procedure notes for findings, recommendations and patient disposition/instructions.     Cayla Wiegand K. Alice Reichert, M.D. Gastroenterology 10/07/2021  10:34 AM

## 2021-10-08 ENCOUNTER — Encounter: Payer: Self-pay | Admitting: Internal Medicine

## 2021-10-08 LAB — SURGICAL PATHOLOGY

## 2021-11-03 DIAGNOSIS — K219 Gastro-esophageal reflux disease without esophagitis: Secondary | ICD-10-CM | POA: Diagnosis not present

## 2021-11-03 DIAGNOSIS — K862 Cyst of pancreas: Secondary | ICD-10-CM | POA: Diagnosis not present

## 2021-11-03 DIAGNOSIS — K52832 Lymphocytic colitis: Secondary | ICD-10-CM | POA: Diagnosis not present

## 2021-11-04 DIAGNOSIS — E119 Type 2 diabetes mellitus without complications: Secondary | ICD-10-CM | POA: Diagnosis not present

## 2021-11-04 DIAGNOSIS — I10 Essential (primary) hypertension: Secondary | ICD-10-CM | POA: Diagnosis not present

## 2021-11-04 DIAGNOSIS — E782 Mixed hyperlipidemia: Secondary | ICD-10-CM | POA: Diagnosis not present

## 2021-11-11 DIAGNOSIS — A6000 Herpesviral infection of urogenital system, unspecified: Secondary | ICD-10-CM | POA: Diagnosis not present

## 2021-11-11 DIAGNOSIS — I700 Atherosclerosis of aorta: Secondary | ICD-10-CM | POA: Diagnosis not present

## 2021-11-11 DIAGNOSIS — K219 Gastro-esophageal reflux disease without esophagitis: Secondary | ICD-10-CM | POA: Diagnosis not present

## 2021-11-11 DIAGNOSIS — C61 Malignant neoplasm of prostate: Secondary | ICD-10-CM | POA: Diagnosis not present

## 2021-11-11 DIAGNOSIS — E782 Mixed hyperlipidemia: Secondary | ICD-10-CM | POA: Diagnosis not present

## 2021-11-11 DIAGNOSIS — G629 Polyneuropathy, unspecified: Secondary | ICD-10-CM | POA: Diagnosis not present

## 2021-11-11 DIAGNOSIS — M47816 Spondylosis without myelopathy or radiculopathy, lumbar region: Secondary | ICD-10-CM | POA: Diagnosis not present

## 2021-11-11 DIAGNOSIS — I6521 Occlusion and stenosis of right carotid artery: Secondary | ICD-10-CM | POA: Diagnosis not present

## 2021-11-11 DIAGNOSIS — I10 Essential (primary) hypertension: Secondary | ICD-10-CM | POA: Diagnosis not present

## 2021-11-11 DIAGNOSIS — M5136 Other intervertebral disc degeneration, lumbar region: Secondary | ICD-10-CM | POA: Diagnosis not present

## 2021-11-11 DIAGNOSIS — Z87891 Personal history of nicotine dependence: Secondary | ICD-10-CM | POA: Diagnosis not present

## 2021-11-11 DIAGNOSIS — E119 Type 2 diabetes mellitus without complications: Secondary | ICD-10-CM | POA: Diagnosis not present

## 2021-12-14 ENCOUNTER — Ambulatory Visit: Payer: PPO | Admitting: Dermatology

## 2021-12-14 DIAGNOSIS — H2513 Age-related nuclear cataract, bilateral: Secondary | ICD-10-CM | POA: Diagnosis not present

## 2021-12-22 ENCOUNTER — Ambulatory Visit (INDEPENDENT_AMBULATORY_CARE_PROVIDER_SITE_OTHER): Payer: PPO | Admitting: Dermatology

## 2021-12-22 DIAGNOSIS — C44629 Squamous cell carcinoma of skin of left upper limb, including shoulder: Secondary | ICD-10-CM | POA: Diagnosis not present

## 2021-12-22 DIAGNOSIS — D485 Neoplasm of uncertain behavior of skin: Secondary | ICD-10-CM

## 2021-12-22 DIAGNOSIS — L578 Other skin changes due to chronic exposure to nonionizing radiation: Secondary | ICD-10-CM | POA: Diagnosis not present

## 2021-12-22 DIAGNOSIS — L820 Inflamed seborrheic keratosis: Secondary | ICD-10-CM

## 2021-12-22 DIAGNOSIS — L821 Other seborrheic keratosis: Secondary | ICD-10-CM

## 2021-12-22 DIAGNOSIS — C4492 Squamous cell carcinoma of skin, unspecified: Secondary | ICD-10-CM

## 2021-12-22 HISTORY — DX: Squamous cell carcinoma of skin, unspecified: C44.92

## 2021-12-22 NOTE — Progress Notes (Signed)
Follow-Up Visit   Subjective  Meet Jason Washington is a 76 y.o. male who presents for the following: Other (Spot of right arm that is swollen and has bled). The patient has spots, moles and lesions to be evaluated, some may be new or changing and the patient has concerns that these could be cancer.  The following portions of the chart were reviewed this encounter and updated as appropriate:   Tobacco  Allergies  Meds  Problems  Med Hx  Surg Hx  Fam Hx     Review of Systems:  No other skin or systemic complaints except as noted in HPI or Assessment and Plan.  Objective  Well appearing patient in no apparent distress; mood and affect are within normal limits.  A focused examination was performed including left arm, abdomen. Relevant physical exam findings are noted in the Assessment and Plan.  Left Forearm - Anterior 1.2 cm hyperkeratotic papule  Left LQA Erythematous stuck-on, waxy papule or plaque   Assessment & Plan  Neoplasm of uncertain behavior of skin Left Forearm - Anterior  Epidermal / dermal shaving  Lesion diameter (cm):  1.2 Informed consent: discussed and consent obtained   Timeout: patient name, date of birth, surgical site, and procedure verified   Procedure prep:  Patient was prepped and draped in usual sterile fashion Prep type:  Isopropyl alcohol Anesthesia: the lesion was anesthetized in a standard fashion   Anesthetic:  1% lidocaine w/ epinephrine 1-100,000 buffered w/ 8.4% NaHCO3 Instrument used: flexible razor blade   Hemostasis achieved with: pressure, aluminum chloride and electrodesiccation   Outcome: patient tolerated procedure well   Post-procedure details: sterile dressing applied and wound care instructions given   Dressing type: bandage and petrolatum    Destruction of lesion Complexity: extensive   Destruction method: electrodesiccation and curettage   Informed consent: discussed and consent obtained   Timeout:  patient name, date  of birth, surgical site, and procedure verified Procedure prep:  Patient was prepped and draped in usual sterile fashion Prep type:  Isopropyl alcohol Anesthesia: the lesion was anesthetized in a standard fashion   Anesthetic:  1% lidocaine w/ epinephrine 1-100,000 buffered w/ 8.4% NaHCO3 Curettage performed in three different directions: Yes   Electrodesiccation performed over the curetted area: Yes   Lesion length (cm):  1.2 Lesion width (cm):  1.2 Margin per side (cm):  0.2 Final wound size (cm):  1.6 Hemostasis achieved with:  pressure and aluminum chloride Outcome: patient tolerated procedure well with no complications   Post-procedure details: sterile dressing applied and wound care instructions given   Dressing type: bandage and petrolatum    Specimen 1 - Surgical pathology Differential Diagnosis: SCC vs other  Check Margins: No EDC today  Inflamed seborrheic keratosis Left LQA  Destruction of lesion - Left LQA Complexity: simple   Destruction method: cryotherapy   Informed consent: discussed and consent obtained   Timeout:  patient name, date of birth, surgical site, and procedure verified Lesion destroyed using liquid nitrogen: Yes   Region frozen until ice ball extended beyond lesion: Yes   Outcome: patient tolerated procedure well with no complications   Post-procedure details: wound care instructions given    Actinic Damage - chronic, secondary to cumulative UV radiation exposure/sun exposure over time - diffuse scaly erythematous macules with underlying dyspigmentation - Recommend daily broad spectrum sunscreen SPF 30+ to sun-exposed areas, reapply every 2 hours as needed.  - Recommend staying in the shade or wearing long sleeves, sun glasses (  UVA+UVB protection) and wide brim hats (4-inch brim around the entire circumference of the hat). - Call for new or changing lesions.  Seborrheic Keratoses - Stuck-on, waxy, tan-brown papules and/or plaques  -  Benign-appearing - Discussed benign etiology and prognosis. - Observe - Call for any changes  Return for Follow up as scheduled.  I, Ashok Cordia, CMA, am acting as scribe for Sarina Ser, MD . Documentation: I have reviewed the above documentation for accuracy and completeness, and I agree with the above.  Sarina Ser, MD

## 2021-12-22 NOTE — Patient Instructions (Addendum)
Cryotherapy Aftercare  Wash gently with soap and water everyday.   Apply Vaseline and Band-Aid daily until healed.  Wound Care Instructions  Cleanse wound gently with soap and water once a day then pat dry with clean gauze. Apply a thin coat of Petrolatum (petroleum jelly, "Vaseline") over the wound (unless you have an allergy to this). We recommend that you use a new, sterile tube of Vaseline. Do not pick or remove scabs. Do not remove the yellow or white "healing tissue" from the base of the wound.  Cover the wound with fresh, clean, nonstick gauze and secure with paper tape. You may use Band-Aids in place of gauze and tape if the wound is small enough, but would recommend trimming much of the tape off as there is often too much. Sometimes Band-Aids can irritate the skin.  You should call the office for your biopsy report after 1 week if you have not already been contacted.  If you experience any problems, such as abnormal amounts of bleeding, swelling, significant bruising, significant pain, or evidence of infection, please call the office immediately.  FOR ADULT SURGERY PATIENTS: If you need something for pain relief you may take 1 extra strength Tylenol (acetaminophen) AND 2 Ibuprofen (200mg each) together every 4 hours as needed for pain. (do not take these if you are allergic to them or if you have a reason you should not take them.) Typically, you may only need pain medication for 1 to 3 days.      Due to recent changes in healthcare laws, you may see results of your pathology and/or laboratory studies on MyChart before the doctors have had a chance to review them. We understand that in some cases there may be results that are confusing or concerning to you. Please understand that not all results are received at the same time and often the doctors may need to interpret multiple results in order to provide you with the best plan of care or course of treatment. Therefore, we ask that you  please give us 2 business days to thoroughly review all your results before contacting the office for clarification. Should we see a critical lab result, you will be contacted sooner.   If You Need Anything After Your Visit  If you have any questions or concerns for your doctor, please call our main line at [phone] and press option 4 to reach your doctor's medical assistant. If no one answers, please leave a voicemail as directed and we will return your call as soon as possible. Messages left after 4 pm will be answered the following business day.   You may also send us a message via MyChart. We typically respond to MyChart messages within 1-2 business days.  For prescription refills, please ask your pharmacy to contact our office. Our fax number is [phone].  If you have an urgent issue when the clinic is closed that cannot wait until the next business day, you can page your doctor at the number below.    Please note that while we do our best to be available for urgent issues outside of office hours, we are not available 24/7.   If you have an urgent issue and are unable to reach us, you may choose to seek medical care at your doctor's office, retail clinic, urgent care center, or emergency room.  If you have a medical emergency, please immediately call 911 or go to the emergency department.  Pager Numbers  - Dr. Kowalski: [phone]  -   Dr. Moye: [phone]  - Dr. Stewart: [phone]  In the event of inclement weather, please call our main line at [phone] for an update on the status of any delays or closures.  Dermatology Medication Tips: Please keep the boxes that topical medications come in in order to help keep track of the instructions about where and how to use these. Pharmacies typically print the medication instructions only on the boxes and not directly on the medication tubes.   If your medication is too expensive, please contact our office at  [phone] option 4 or send us a message through MyChart.   We are unable to tell what your co-pay for medications will be in advance as this is different depending on your insurance coverage. However, we may be able to find a substitute medication at lower cost or fill out paperwork to get insurance to cover a needed medication.   If a prior authorization is required to get your medication covered by your insurance company, please allow us 1-2 business days to complete this process.  Drug prices often vary depending on where the prescription is filled and some pharmacies may offer cheaper prices.  The website www.goodrx.com contains coupons for medications through different pharmacies. The prices here do not account for what the cost may be with help from insurance (it may be cheaper with your insurance), but the website can give you the price if you did not use any insurance.  - You can print the associated coupon and take it with your prescription to the pharmacy.  - You may also stop by our office during regular business hours and pick up a GoodRx coupon card.  - If you need your prescription sent electronically to a different pharmacy, notify our office through [facility] MyChart or by phone at [phone] option 4.     Si Usted Necesita Algo Despus de Su Visita  Tambin puede enviarnos un mensaje a travs de MyChart. Por lo general respondemos a los mensajes de MyChart en el transcurso de 1 a 2 das hbiles.  Para renovar recetas, por favor pida a su farmacia que se ponga en contacto con nuestra oficina. Nuestro nmero de fax es el [phone].  Si tiene un asunto urgente cuando la clnica est cerrada y que no puede esperar hasta el siguiente da hbil, puede llamar/localizar a su doctor(a) al nmero que aparece a continuacin.   Por favor, tenga en cuenta que aunque hacemos todo lo posible para estar disponibles para asuntos urgentes fuera del horario de oficina, no estamos  disponibles las 24 horas del da, los 7 das de la semana.   Si tiene un problema urgente y no puede comunicarse con nosotros, puede optar por buscar atencin mdica  en el consultorio de su doctor(a), en una clnica privada, en un centro de atencin urgente o en una sala de emergencias.  Si tiene una emergencia mdica, por favor llame inmediatamente al 911 o vaya a la sala de emergencias.  Nmeros de bper  - Dr. Kowalski: [phone]  - Dra. Moye: [phone]  - Dra. Stewart: [phone]  En caso de inclemencias del tiempo, por favor llame a nuestra lnea principal al [phone] para una actualizacin sobre el estado de cualquier retraso o cierre.  Consejos para la medicacin en dermatologa: Por favor, guarde las cajas en las que vienen los medicamentos de uso tpico para ayudarle a seguir las instrucciones sobre dnde y cmo usarlos. Las farmacias generalmente imprimen las instrucciones del medicamento slo en las cajas y   no directamente en los tubos del medicamento.   Si su medicamento es muy caro, por favor, pngase en contacto con nuestra oficina llamando al [phone] y presione la opcin 4 o envenos un mensaje a travs de MyChart.   No podemos decirle cul ser su copago por los medicamentos por adelantado ya que esto es diferente dependiendo de la cobertura de su seguro. Sin embargo, es posible que podamos encontrar un medicamento sustituto a menor costo o llenar un formulario para que el seguro cubra el medicamento que se considera necesario.   Si se requiere una autorizacin previa para que su compaa de seguros cubra su medicamento, por favor permtanos de 1 a 2 das hbiles para completar este proceso.  Los precios de los medicamentos varan con frecuencia dependiendo del lugar de dnde se surte la receta y alguna farmacias pueden ofrecer precios ms baratos.  El sitio web www.goodrx.com tiene cupones para medicamentos de diferentes farmacias. Los precios aqu no  tienen en cuenta lo que podra costar con la ayuda del seguro (puede ser ms barato con su seguro), pero el sitio web puede darle el precio si no utiliz ningn seguro.  - Puede imprimir el cupn correspondiente y llevarlo con su receta a la farmacia.  - Tambin puede pasar por nuestra oficina durante el horario de atencin regular y recoger una tarjeta de cupones de GoodRx.  - Si necesita que su receta se enve electrnicamente a una farmacia diferente, informe a nuestra oficina a travs de MyChart de [facility] o por telfono llamando al [phone] y presione la opcin 4.  

## 2021-12-28 ENCOUNTER — Telehealth: Payer: Self-pay

## 2021-12-28 NOTE — Telephone Encounter (Signed)
-----   Message from Ralene Bathe, MD sent at 12/25/2021  1:07 PM EST ----- Diagnosis Skin , left forearm anterior SQUAMOUS CELL CARCINOMA, KERATOACANTHOMA TYPE   Cancer - SCC Already treated Recheck next visit

## 2021-12-28 NOTE — Telephone Encounter (Signed)
Discussed biopsy results with pt  °

## 2022-01-01 ENCOUNTER — Encounter: Payer: Self-pay | Admitting: Dermatology

## 2022-01-12 DIAGNOSIS — G8929 Other chronic pain: Secondary | ICD-10-CM | POA: Diagnosis not present

## 2022-01-12 DIAGNOSIS — G629 Polyneuropathy, unspecified: Secondary | ICD-10-CM | POA: Diagnosis not present

## 2022-01-12 DIAGNOSIS — M542 Cervicalgia: Secondary | ICD-10-CM | POA: Diagnosis not present

## 2022-01-12 DIAGNOSIS — R2689 Other abnormalities of gait and mobility: Secondary | ICD-10-CM | POA: Diagnosis not present

## 2022-01-12 DIAGNOSIS — M545 Low back pain, unspecified: Secondary | ICD-10-CM | POA: Diagnosis not present

## 2022-01-12 DIAGNOSIS — R531 Weakness: Secondary | ICD-10-CM | POA: Diagnosis not present

## 2022-01-12 DIAGNOSIS — E538 Deficiency of other specified B group vitamins: Secondary | ICD-10-CM | POA: Diagnosis not present

## 2022-01-12 DIAGNOSIS — R413 Other amnesia: Secondary | ICD-10-CM | POA: Diagnosis not present

## 2022-01-12 DIAGNOSIS — R262 Difficulty in walking, not elsewhere classified: Secondary | ICD-10-CM | POA: Diagnosis not present

## 2022-01-13 ENCOUNTER — Ambulatory Visit: Payer: PPO | Admitting: Dermatology

## 2022-01-13 ENCOUNTER — Encounter: Payer: Self-pay | Admitting: Dermatology

## 2022-01-13 VITALS — BP 136/83 | HR 74

## 2022-01-13 DIAGNOSIS — L821 Other seborrheic keratosis: Secondary | ICD-10-CM | POA: Diagnosis not present

## 2022-01-13 DIAGNOSIS — L820 Inflamed seborrheic keratosis: Secondary | ICD-10-CM | POA: Diagnosis not present

## 2022-01-13 DIAGNOSIS — B353 Tinea pedis: Secondary | ICD-10-CM

## 2022-01-13 DIAGNOSIS — Z5111 Encounter for antineoplastic chemotherapy: Secondary | ICD-10-CM

## 2022-01-13 DIAGNOSIS — L578 Other skin changes due to chronic exposure to nonionizing radiation: Secondary | ICD-10-CM

## 2022-01-13 DIAGNOSIS — Z85828 Personal history of other malignant neoplasm of skin: Secondary | ICD-10-CM | POA: Diagnosis not present

## 2022-01-13 DIAGNOSIS — Z8589 Personal history of malignant neoplasm of other organs and systems: Secondary | ICD-10-CM

## 2022-01-13 DIAGNOSIS — D229 Melanocytic nevi, unspecified: Secondary | ICD-10-CM | POA: Diagnosis not present

## 2022-01-13 DIAGNOSIS — L814 Other melanin hyperpigmentation: Secondary | ICD-10-CM | POA: Diagnosis not present

## 2022-01-13 DIAGNOSIS — L570 Actinic keratosis: Secondary | ICD-10-CM

## 2022-01-13 DIAGNOSIS — Z1283 Encounter for screening for malignant neoplasm of skin: Secondary | ICD-10-CM

## 2022-01-13 DIAGNOSIS — Z79899 Other long term (current) drug therapy: Secondary | ICD-10-CM | POA: Diagnosis not present

## 2022-01-13 MED ORDER — KETOCONAZOLE 2 % EX CREA: 1.0000 | TOPICAL_CREAM | Freq: Every evening | CUTANEOUS | 6 refills | Status: DC

## 2022-01-13 NOTE — Progress Notes (Signed)
Follow-Up Visit   Subjective  Jason Washington Jason Washington is a 76 y.o. male who presents for the following: Total body skin exam (Hx of SCC, BCC, Aks). The patient presents for Total-Body Skin Exam (TBSE) for skin cancer screening and mole check.  The patient has spots, moles and lesions to be evaluated, some may be new or changing and the patient has concerns that these could be cancer.   The following portions of the chart were reviewed this encounter and updated as appropriate:   Tobacco  Allergies  Meds  Problems  Med Hx  Surg Hx  Fam Hx     Review of Systems:  No other skin or systemic complaints except as noted in HPI or Assessment and Plan.  Objective  Well appearing patient in no apparent distress; mood and affect are within normal limits.  A full examination was performed including scalp, head, eyes, ears, nose, lips, neck, chest, axillae, abdomen, back, buttocks, bilateral upper extremities, bilateral lower extremities, hands, feet, fingers, toes, fingernails, and toenails. All findings within normal limits unless otherwise noted below.  R sideburn x 2 (2) Stuck on waxy paps with erythema  R cheek x 1, L neck x 3 (4) Pink scaly macules  bil feet Scaling feet   Assessment & Plan  Inflamed seborrheic keratosis (2) R sideburn x 2  Symptomatic, irritating, patient would like treated.   Destruction of lesion - R sideburn x 2 Complexity: simple   Destruction method: cryotherapy   Informed consent: discussed and consent obtained   Timeout:  patient name, date of birth, surgical site, and procedure verified Lesion destroyed using liquid nitrogen: Yes   Region frozen until ice ball extended beyond lesion: Yes   Outcome: patient tolerated procedure well with no complications   Post-procedure details: wound care instructions given    AK (actinic keratosis) (4) R cheek x 1, L neck x 3  Destruction of lesion - R cheek x 1, L neck x 3 Complexity: simple   Destruction  method: cryotherapy   Informed consent: discussed and consent obtained   Timeout:  patient name, date of birth, surgical site, and procedure verified Lesion destroyed using liquid nitrogen: Yes   Region frozen until ice ball extended beyond lesion: Yes   Outcome: patient tolerated procedure well with no complications   Post-procedure details: wound care instructions given    Related Medications fluorouracil (EFUDEX) 5 % cream In one month (May 22nd) apply to the scalp BID x 1 week.  Tinea pedis of both feet bil feet  Chronic and persistent condition with duration or expected duration over one year. Condition is symptomatic / bothersome to patient. Not to goal.   Start Ketoconazole 2% cr qhs to feet  ketoconazole (NIZORAL) 2 % cream - bil feet Apply 1 Application topically at bedtime. Qhs to feet  Lentigines - Scattered tan macules - Due to sun exposure - Benign-appearing, observe - Recommend daily broad spectrum sunscreen SPF 30+ to sun-exposed areas, reapply every 2 hours as needed. - Call for any changes  Seborrheic Keratoses - Stuck-on, waxy, tan-brown papules and/or plaques  - Benign-appearing - Discussed benign etiology and prognosis. - Observe - Call for any changes  Melanocytic Nevi - Tan-brown and/or pink-flesh-colored symmetric macules and papules - Benign appearing on exam today - Observation - Call clinic for new or changing moles - Recommend daily use of broad spectrum spf 30+ sunscreen to sun-exposed areas.   Hemangiomas - Red papules - Discussed benign nature - Observe -  Call for any changes  Actinic Damage with PreCancerous Actinic Keratoses Counseling for Topical Chemotherapy Management: Patient exhibits: - Severe, confluent actinic changes with pre-cancerous actinic keratoses that is secondary to cumulative UV radiation exposure over time - Condition that is severe; chronic, not at goal. - diffuse scaly erythematous macules and papules with  underlying dyspigmentation - Discussed Prescription "Field Treatment" topical Chemotherapy for Severe, Chronic Confluent Actinic Changes with Pre-Cancerous Actinic Keratoses Field treatment involves treatment of an entire area of skin that has confluent Actinic Changes (Sun/ Ultraviolet light damage) and PreCancerous Actinic Keratoses by method of PhotoDynamic Therapy (PDT) and/or prescription Topical Chemotherapy agents such as 5-fluorouracil, 5-fluorouracil/calcipotriene, and/or imiquimod.  The purpose is to decrease the number of clinically evident and subclinical PreCancerous lesions to prevent progression to development of skin cancer by chemically destroying early precancer changes that may or may not be visible.  It has been shown to reduce the risk of developing skin cancer in the treated area. As a result of treatment, redness, scaling, crusting, and open sores may occur during treatment course. One or more than one of these methods may be used and may have to be used several times to control, suppress and eliminate the PreCancerous changes. Discussed treatment course, expected reaction, and possible side effects. - Recommend daily broad spectrum sunscreen SPF 30+ to sun-exposed areas, reapply every 2 hours as needed.  - Staying in the shade or wearing long sleeves, sun glasses (UVA+UVB protection) and wide brim hats (4-inch brim around the entire circumference of the hat) are also recommended. - Call for new or changing lesions.  - Scalp - Plan Red light to scalp with debridement January 2024  Skin cancer screening performed today.   History of Basal Cell Carcinoma of the Skin - No evidence of recurrence today - Recommend regular full body skin exams - Recommend daily broad spectrum sunscreen SPF 30+ to sun-exposed areas, reapply every 2 hours as needed.  - Call if any new or changing lesions are noted between office visits  - R sup lat pectoral  History of Squamous Cell Carcinoma of the  Skin - No evidence of recurrence today - No lymphadenopathy - Recommend regular full body skin exams - Recommend daily broad spectrum sunscreen SPF 30+ to sun-exposed areas, reapply every 2 hours as needed.  - Call if any new or changing lesions are noted between office visits - L forearm   Return in about 6 weeks (around 02/24/2022) for red light scalp with debridement, 1mAk f/u.  I, SOthelia Pulling RMA, am acting as scribe for DSarina Ser MD . Documentation: I have reviewed the above documentation for accuracy and completeness, and I agree with the above.  DSarina Ser MD

## 2022-01-13 NOTE — Patient Instructions (Signed)
Due to recent changes in healthcare laws, you may see results of your pathology and/or laboratory studies on MyChart before the doctors have had a chance to review them. We understand that in some cases there may be results that are confusing or concerning to you. Please understand that not all results are received at the same time and often the doctors may need to interpret multiple results in order to provide you with the best plan of care or course of treatment. Therefore, we ask that you please give us 2 business days to thoroughly review all your results before contacting the office for clarification. Should we see a critical lab result, you will be contacted sooner.   If You Need Anything After Your Visit  If you have any questions or concerns for your doctor, please call our main line at [phone] and press option 4 to reach your doctor's medical assistant. If no one answers, please leave a voicemail as directed and we will return your call as soon as possible. Messages left after 4 pm will be answered the following business day.   You may also send us a message via MyChart. We typically respond to MyChart messages within 1-2 business days.  For prescription refills, please ask your pharmacy to contact our office. Our fax number is [phone].  If you have an urgent issue when the clinic is closed that cannot wait until the next business day, you can page your doctor at the number below.    Please note that while we do our best to be available for urgent issues outside of office hours, we are not available 24/7.   If you have an urgent issue and are unable to reach us, you may choose to seek medical care at your doctor's office, retail clinic, urgent care center, or emergency room.  If you have a medical emergency, please immediately call 911 or go to the emergency department.  Pager Numbers  - Dr. Kowalski: [phone]  - Dr. Moye: [phone]  - Dr. Stewart:  [phone]  In the event of inclement weather, please call our main line at [phone] for an update on the status of any delays or closures.  Dermatology Medication Tips: Please keep the boxes that topical medications come in in order to help keep track of the instructions about where and how to use these. Pharmacies typically print the medication instructions only on the boxes and not directly on the medication tubes.   If your medication is too expensive, please contact our office at [phone] option 4 or send us a message through MyChart.   We are unable to tell what your co-pay for medications will be in advance as this is different depending on your insurance coverage. However, we may be able to find a substitute medication at lower cost or fill out paperwork to get insurance to cover a needed medication.   If a prior authorization is required to get your medication covered by your insurance company, please allow us 1-2 business days to complete this process.  Drug prices often vary depending on where the prescription is filled and some pharmacies may offer cheaper prices.  The website www.goodrx.com contains coupons for medications through different pharmacies. The prices here do not account for what the cost may be with help from insurance (it may be cheaper with your insurance), but the website can give you the price if you did not use any insurance.  - You can print the associated coupon and take it with   your prescription to the pharmacy.  - You may also stop by our office during regular business hours and pick up a GoodRx coupon card.  - If you need your prescription sent electronically to a different pharmacy, notify our office through [facility] MyChart or by phone at [phone] option 4.     Si Usted Necesita Algo Despus de Su Visita  Tambin puede enviarnos un mensaje a travs de MyChart. Por lo general respondemos a los mensajes de MyChart en el transcurso de 1 a 2  das hbiles.  Para renovar recetas, por favor pida a su farmacia que se ponga en contacto con nuestra oficina. Nuestro nmero de fax es el [phone].  Si tiene un asunto urgente cuando la clnica est cerrada y que no puede esperar hasta el siguiente da hbil, puede llamar/localizar a su doctor(a) al nmero que aparece a continuacin.   Por favor, tenga en cuenta que aunque hacemos todo lo posible para estar disponibles para asuntos urgentes fuera del horario de oficina, no estamos disponibles las 24 horas del da, los 7 das de la semana.   Si tiene un problema urgente y no puede comunicarse con nosotros, puede optar por buscar atencin mdica  en el consultorio de su doctor(a), en una clnica privada, en un centro de atencin urgente o en una sala de emergencias.  Si tiene una emergencia mdica, por favor llame inmediatamente al 911 o vaya a la sala de emergencias.  Nmeros de bper  - Dr. Kowalski: [phone]  - Dra. Moye: [phone]  - Dra. Stewart: [phone]  En caso de inclemencias del tiempo, por favor llame a nuestra lnea principal al [phone] para una actualizacin sobre el estado de cualquier retraso o cierre.  Consejos para la medicacin en dermatologa: Por favor, guarde las cajas en las que vienen los medicamentos de uso tpico para ayudarle a seguir las instrucciones sobre dnde y cmo usarlos. Las farmacias generalmente imprimen las instrucciones del medicamento slo en las cajas y no directamente en los tubos del medicamento.   Si su medicamento es muy caro, por favor, pngase en contacto con nuestra oficina llamando al [phone] y presione la opcin 4 o envenos un mensaje a travs de MyChart.   No podemos decirle cul ser su copago por los medicamentos por adelantado ya que esto es diferente dependiendo de la cobertura de su seguro. Sin embargo, es posible que podamos encontrar un medicamento sustituto a menor costo o llenar un formulario para que el  seguro cubra el medicamento que se considera necesario.   Si se requiere una autorizacin previa para que su compaa de seguros cubra su medicamento, por favor permtanos de 1 a 2 das hbiles para completar este proceso.  Los precios de los medicamentos varan con frecuencia dependiendo del lugar de dnde se surte la receta y alguna farmacias pueden ofrecer precios ms baratos.  El sitio web www.goodrx.com tiene cupones para medicamentos de diferentes farmacias. Los precios aqu no tienen en cuenta lo que podra costar con la ayuda del seguro (puede ser ms barato con su seguro), pero el sitio web puede darle el precio si no utiliz ningn seguro.  - Puede imprimir el cupn correspondiente y llevarlo con su receta a la farmacia.  - Tambin puede pasar por nuestra oficina durante el horario de atencin regular y recoger una tarjeta de cupones de GoodRx.  - Si necesita que su receta se enve electrnicamente a una farmacia diferente, informe a nuestra oficina a travs de MyChart de [facility]   o por telfono llamando al [phone] y presione la opcin 4.  

## 2022-01-20 DIAGNOSIS — G629 Polyneuropathy, unspecified: Secondary | ICD-10-CM | POA: Diagnosis not present

## 2022-01-20 DIAGNOSIS — E538 Deficiency of other specified B group vitamins: Secondary | ICD-10-CM | POA: Diagnosis not present

## 2022-01-20 DIAGNOSIS — E119 Type 2 diabetes mellitus without complications: Secondary | ICD-10-CM | POA: Diagnosis not present

## 2022-01-20 DIAGNOSIS — I10 Essential (primary) hypertension: Secondary | ICD-10-CM | POA: Diagnosis not present

## 2022-01-20 DIAGNOSIS — R2689 Other abnormalities of gait and mobility: Secondary | ICD-10-CM | POA: Diagnosis not present

## 2022-01-20 DIAGNOSIS — G4733 Obstructive sleep apnea (adult) (pediatric): Secondary | ICD-10-CM | POA: Diagnosis not present

## 2022-01-20 DIAGNOSIS — R413 Other amnesia: Secondary | ICD-10-CM | POA: Diagnosis not present

## 2022-01-20 DIAGNOSIS — M5136 Other intervertebral disc degeneration, lumbar region: Secondary | ICD-10-CM | POA: Diagnosis not present

## 2022-01-20 DIAGNOSIS — M47816 Spondylosis without myelopathy or radiculopathy, lumbar region: Secondary | ICD-10-CM | POA: Diagnosis not present

## 2022-01-21 DIAGNOSIS — M79675 Pain in left toe(s): Secondary | ICD-10-CM | POA: Diagnosis not present

## 2022-01-21 DIAGNOSIS — M79674 Pain in right toe(s): Secondary | ICD-10-CM | POA: Diagnosis not present

## 2022-01-21 DIAGNOSIS — B351 Tinea unguium: Secondary | ICD-10-CM | POA: Diagnosis not present

## 2022-01-21 DIAGNOSIS — L03031 Cellulitis of right toe: Secondary | ICD-10-CM | POA: Diagnosis not present

## 2022-01-24 ENCOUNTER — Encounter: Payer: Self-pay | Admitting: Dermatology

## 2022-01-29 DIAGNOSIS — E538 Deficiency of other specified B group vitamins: Secondary | ICD-10-CM | POA: Diagnosis not present

## 2022-02-02 DIAGNOSIS — B351 Tinea unguium: Secondary | ICD-10-CM | POA: Diagnosis not present

## 2022-02-02 DIAGNOSIS — M79675 Pain in left toe(s): Secondary | ICD-10-CM | POA: Diagnosis not present

## 2022-02-02 DIAGNOSIS — M79674 Pain in right toe(s): Secondary | ICD-10-CM | POA: Diagnosis not present

## 2022-02-02 DIAGNOSIS — L03031 Cellulitis of right toe: Secondary | ICD-10-CM | POA: Diagnosis not present

## 2022-02-22 ENCOUNTER — Ambulatory Visit: Payer: PPO | Admitting: Dermatology

## 2022-02-22 DIAGNOSIS — L720 Epidermal cyst: Secondary | ICD-10-CM | POA: Diagnosis not present

## 2022-02-22 DIAGNOSIS — L03312 Cellulitis of back [any part except buttock]: Secondary | ICD-10-CM | POA: Diagnosis not present

## 2022-02-22 DIAGNOSIS — L0291 Cutaneous abscess, unspecified: Secondary | ICD-10-CM

## 2022-02-22 DIAGNOSIS — L02212 Cutaneous abscess of back [any part, except buttock]: Secondary | ICD-10-CM | POA: Diagnosis not present

## 2022-02-22 MED ORDER — MUPIROCIN 2 % EX OINT: 1.0000 | TOPICAL_OINTMENT | Freq: Every day | CUTANEOUS | 0 refills | Status: DC

## 2022-02-22 MED ORDER — DOXYCYCLINE HYCLATE 100 MG PO CAPS: 100.0000 mg | ORAL_CAPSULE | Freq: Two times a day (BID) | ORAL | 0 refills | Status: AC

## 2022-02-22 NOTE — Patient Instructions (Signed)
Due to recent changes in healthcare laws, you may see results of your pathology and/or laboratory studies on MyChart before the doctors have had a chance to review them. We understand that in some cases there may be results that are confusing or concerning to you. Please understand that not all results are received at the same time and often the doctors may need to interpret multiple results in order to provide you with the best plan of care or course of treatment. Therefore, we ask that you please give us 2 business days to thoroughly review all your results before contacting the office for clarification. Should we see a critical lab result, you will be contacted sooner.   If You Need Anything After Your Visit  If you have any questions or concerns for your doctor, please call our main line at [phone] and press option 4 to reach your doctor's medical assistant. If no one answers, please leave a voicemail as directed and we will return your call as soon as possible. Messages left after 4 pm will be answered the following business day.   You may also send us a message via MyChart. We typically respond to MyChart messages within 1-2 business days.  For prescription refills, please ask your pharmacy to contact our office. Our fax number is [phone].  If you have an urgent issue when the clinic is closed that cannot wait until the next business day, you can page your doctor at the number below.    Please note that while we do our best to be available for urgent issues outside of office hours, we are not available 24/7.   If you have an urgent issue and are unable to reach us, you may choose to seek medical care at your doctor's office, retail clinic, urgent care center, or emergency room.  If you have a medical emergency, please immediately call 911 or go to the emergency department.  Pager Numbers  - Dr. Kowalski: [phone]  - Dr. Moye: [phone]  - Dr. Stewart:  [phone]  In the event of inclement weather, please call our main line at [phone] for an update on the status of any delays or closures.  Dermatology Medication Tips: Please keep the boxes that topical medications come in in order to help keep track of the instructions about where and how to use these. Pharmacies typically print the medication instructions only on the boxes and not directly on the medication tubes.   If your medication is too expensive, please contact our office at [phone] option 4 or send us a message through MyChart.   We are unable to tell what your co-pay for medications will be in advance as this is different depending on your insurance coverage. However, we may be able to find a substitute medication at lower cost or fill out paperwork to get insurance to cover a needed medication.   If a prior authorization is required to get your medication covered by your insurance company, please allow us 1-2 business days to complete this process.  Drug prices often vary depending on where the prescription is filled and some pharmacies may offer cheaper prices.  The website www.goodrx.com contains coupons for medications through different pharmacies. The prices here do not account for what the cost may be with help from insurance (it may be cheaper with your insurance), but the website can give you the price if you did not use any insurance.  - You can print the associated coupon and take it with   your prescription to the pharmacy.  - You may also stop by our office during regular business hours and pick up a GoodRx coupon card.  - If you need your prescription sent electronically to a different pharmacy, notify our office through [facility] MyChart or by phone at [phone] option 4.     Si Usted Necesita Algo Despus de Su Visita  Tambin puede enviarnos un mensaje a travs de MyChart. Por lo general respondemos a los mensajes de MyChart en el transcurso de 1 a 2  das hbiles.  Para renovar recetas, por favor pida a su farmacia que se ponga en contacto con nuestra oficina. Nuestro nmero de fax es el [phone].  Si tiene un asunto urgente cuando la clnica est cerrada y que no puede esperar hasta el siguiente da hbil, puede llamar/localizar a su doctor(a) al nmero que aparece a continuacin.   Por favor, tenga en cuenta que aunque hacemos todo lo posible para estar disponibles para asuntos urgentes fuera del horario de oficina, no estamos disponibles las 24 horas del da, los 7 das de la semana.   Si tiene un problema urgente y no puede comunicarse con nosotros, puede optar por buscar atencin mdica  en el consultorio de su doctor(a), en una clnica privada, en un centro de atencin urgente o en una sala de emergencias.  Si tiene una emergencia mdica, por favor llame inmediatamente al 911 o vaya a la sala de emergencias.  Nmeros de bper  - Dr. Kowalski: [phone]  - Dra. Moye: [phone]  - Dra. Stewart: [phone]  En caso de inclemencias del tiempo, por favor llame a nuestra lnea principal al [phone] para una actualizacin sobre el estado de cualquier retraso o cierre.  Consejos para la medicacin en dermatologa: Por favor, guarde las cajas en las que vienen los medicamentos de uso tpico para ayudarle a seguir las instrucciones sobre dnde y cmo usarlos. Las farmacias generalmente imprimen las instrucciones del medicamento slo en las cajas y no directamente en los tubos del medicamento.   Si su medicamento es muy caro, por favor, pngase en contacto con nuestra oficina llamando al [phone] y presione la opcin 4 o envenos un mensaje a travs de MyChart.   No podemos decirle cul ser su copago por los medicamentos por adelantado ya que esto es diferente dependiendo de la cobertura de su seguro. Sin embargo, es posible que podamos encontrar un medicamento sustituto a menor costo o llenar un formulario para que el  seguro cubra el medicamento que se considera necesario.   Si se requiere una autorizacin previa para que su compaa de seguros cubra su medicamento, por favor permtanos de 1 a 2 das hbiles para completar este proceso.  Los precios de los medicamentos varan con frecuencia dependiendo del lugar de dnde se surte la receta y alguna farmacias pueden ofrecer precios ms baratos.  El sitio web www.goodrx.com tiene cupones para medicamentos de diferentes farmacias. Los precios aqu no tienen en cuenta lo que podra costar con la ayuda del seguro (puede ser ms barato con su seguro), pero el sitio web puede darle el precio si no utiliz ningn seguro.  - Puede imprimir el cupn correspondiente y llevarlo con su receta a la farmacia.  - Tambin puede pasar por nuestra oficina durante el horario de atencin regular y recoger una tarjeta de cupones de GoodRx.  - Si necesita que su receta se enve electrnicamente a una farmacia diferente, informe a nuestra oficina a travs de MyChart de [facility]   o por telfono llamando al [phone] y presione la opcin 4.  

## 2022-02-22 NOTE — Progress Notes (Unsigned)
   Follow-Up Visit   Subjective  [place] Viona Gilmore Jontavius Rabalais is a 77 y.o. male who presents for the following: Other (Spot on back x ~1 month - started draining about 3 weeks ago) and Skin Problem. The patient has spots, moles and lesions to be evaluated, some may be new or changing and the patient has concerns that these could be cancer.  The following portions of the chart were reviewed this encounter and updated as appropriate:   Tobacco  Allergies  Meds  Problems  Med Hx  Surg Hx  Fam Hx     Review of Systems:  No other skin or systemic complaints except as noted in HPI or Assessment and Plan.  Objective  Well appearing patient in no apparent distress; mood and affect are within normal limits.  A focused examination was performed including back. Relevant physical exam findings are noted in the Assessment and Plan.  Right Upper Back 3.0 cm erythematous nodule with crust   Assessment & Plan  Epidermal inclusion cyst Right Upper Back With abscess formation and Cellulitis  Start Doxycycyline 100 mg 1 po bid with food and plenty of fluid, Mupirocin oint qd  Incision and Drainage - Right Upper Back - complicated Location: right upper back  Informed Consent: Discussed risks (permanent scarring, light or dark discoloration, infection, pain, bleeding, bruising, redness, damage to adjacent structures, and recurrence of the lesion) and benefits of the procedure, as well as the alternatives.  Informed consent was obtained.  Preparation: The area was prepped with alcohol.  Anesthesia: Lidocaine 1% with epinephrine  Procedure Details: An incision was made overlying the lesion. The lesion drained pus, cyst material and blood.  A large amount of fluid was drained.    Antibiotic ointment and a sterile pressure dressing were applied. The patient tolerated procedure well.  Total number of lesions drained: 1  Plan: The patient was instructed on post-op care. Recommend OTC analgesia as  needed for pain.  doxycycline (VIBRAMYCIN) 100 MG capsule - Right Upper Back Take 1 capsule (100 mg total) by mouth 2 (two) times daily for 7 days. With food and plenty of fluid  mupirocin ointment (BACTROBAN) 2 % - Right Upper Back Apply 1 Application topically daily.  Return for Follow up as scheduled. Documentation: I have reviewed the above documentation for accuracy and completeness, and I agree with the above.  Sarina Ser, MD

## 2022-02-23 ENCOUNTER — Encounter: Payer: Self-pay | Admitting: Dermatology

## 2022-02-24 ENCOUNTER — Encounter: Payer: Self-pay | Admitting: Dermatology

## 2022-02-24 ENCOUNTER — Ambulatory Visit (INDEPENDENT_AMBULATORY_CARE_PROVIDER_SITE_OTHER): Payer: PPO | Admitting: Dermatology

## 2022-02-24 DIAGNOSIS — L570 Actinic keratosis: Secondary | ICD-10-CM | POA: Diagnosis not present

## 2022-02-24 DIAGNOSIS — L720 Epidermal cyst: Secondary | ICD-10-CM

## 2022-02-24 MED ORDER — AMINOLEVULINIC ACID HCL 10 % EX GEL
2000.0000 mg | Freq: Once | CUTANEOUS | Status: AC
  Administered 2022-02-24: 2000 mg via TOPICAL

## 2022-02-24 NOTE — Patient Instructions (Signed)

## 2022-02-24 NOTE — Progress Notes (Signed)
   Follow-Up Visit   Subjective  Jason Washington is a 77 y.o. male who presents for the following: Red light photo dynamic therapy and dressing change.   The following portions of the chart were reviewed this encounter and updated as appropriate:     Review of Systems: No other skin or systemic complaints.  Objective  Well appearing patient in no apparent distress; mood and affect are within normal limits.  A focused examination was performed including scalp and back. Relevant physical exam findings are noted in the Assessment and Plan.   Assessment & Plan  AK (actinic keratosis) Scalp  Photodynamic therapy - Scalp Procedure discussed: discussed risks, benefits, side effects. and alternatives   Prep: site scrubbed/prepped with acetone   Debridement needed: Yes (performed by Physician with sand paper.  [phone]))   Location:  Scalp Number of lesions:  Multiple Type of treatment:  Red light Aminolevulinic Acid (see MAR for details): Ameluz Aminolevulinic Acid comment:  [id number] Amount of Ameluz (mg):  1 Incubation time (minutes):  120 Number of minutes under lamp:  10 Number of seconds under lamp:  0 Cooling:  Fan Post-procedure details: sunscreen applied   Additional details:  Debridement performed by Physician with sand paper.  ([id number])  Related Medications Aminolevulinic Acid HCl 10 % GEL 2,000 mg   Epidermal cyst Right Upper Back  Dressing changed from I&D procedure Monday, 02/22/22. Wound cleaned with Puracyn and Telfa and Tegaderm re applied.    Johnsie Kindred, RMA Documentation: I have reviewed the above documentation for accuracy and completeness, and I agree with the above.  Sarina Ser, MD

## 2022-04-14 DIAGNOSIS — M542 Cervicalgia: Secondary | ICD-10-CM | POA: Diagnosis not present

## 2022-04-14 DIAGNOSIS — R531 Weakness: Secondary | ICD-10-CM | POA: Diagnosis not present

## 2022-04-14 DIAGNOSIS — M545 Low back pain, unspecified: Secondary | ICD-10-CM | POA: Diagnosis not present

## 2022-04-14 DIAGNOSIS — G629 Polyneuropathy, unspecified: Secondary | ICD-10-CM | POA: Diagnosis not present

## 2022-04-14 DIAGNOSIS — E538 Deficiency of other specified B group vitamins: Secondary | ICD-10-CM | POA: Diagnosis not present

## 2022-04-14 DIAGNOSIS — G8929 Other chronic pain: Secondary | ICD-10-CM | POA: Diagnosis not present

## 2022-04-14 DIAGNOSIS — R2689 Other abnormalities of gait and mobility: Secondary | ICD-10-CM | POA: Diagnosis not present

## 2022-04-14 DIAGNOSIS — R413 Other amnesia: Secondary | ICD-10-CM | POA: Diagnosis not present

## 2022-04-14 DIAGNOSIS — R262 Difficulty in walking, not elsewhere classified: Secondary | ICD-10-CM | POA: Diagnosis not present

## 2022-05-06 DIAGNOSIS — C61 Malignant neoplasm of prostate: Secondary | ICD-10-CM | POA: Diagnosis not present

## 2022-05-06 DIAGNOSIS — R413 Other amnesia: Secondary | ICD-10-CM | POA: Diagnosis not present

## 2022-05-06 DIAGNOSIS — R531 Weakness: Secondary | ICD-10-CM | POA: Diagnosis not present

## 2022-05-06 DIAGNOSIS — R262 Difficulty in walking, not elsewhere classified: Secondary | ICD-10-CM | POA: Diagnosis not present

## 2022-05-06 DIAGNOSIS — M542 Cervicalgia: Secondary | ICD-10-CM | POA: Diagnosis not present

## 2022-05-06 DIAGNOSIS — E782 Mixed hyperlipidemia: Secondary | ICD-10-CM | POA: Diagnosis not present

## 2022-05-06 DIAGNOSIS — I10 Essential (primary) hypertension: Secondary | ICD-10-CM | POA: Diagnosis not present

## 2022-05-06 DIAGNOSIS — E119 Type 2 diabetes mellitus without complications: Secondary | ICD-10-CM | POA: Diagnosis not present

## 2022-05-06 DIAGNOSIS — E538 Deficiency of other specified B group vitamins: Secondary | ICD-10-CM | POA: Diagnosis not present

## 2022-05-06 DIAGNOSIS — R2689 Other abnormalities of gait and mobility: Secondary | ICD-10-CM | POA: Diagnosis not present

## 2022-05-06 DIAGNOSIS — G629 Polyneuropathy, unspecified: Secondary | ICD-10-CM | POA: Diagnosis not present

## 2022-05-06 DIAGNOSIS — G8929 Other chronic pain: Secondary | ICD-10-CM | POA: Diagnosis not present

## 2022-05-13 DIAGNOSIS — K219 Gastro-esophageal reflux disease without esophagitis: Secondary | ICD-10-CM | POA: Diagnosis not present

## 2022-05-13 DIAGNOSIS — I10 Essential (primary) hypertension: Secondary | ICD-10-CM | POA: Diagnosis not present

## 2022-05-13 DIAGNOSIS — M47816 Spondylosis without myelopathy or radiculopathy, lumbar region: Secondary | ICD-10-CM | POA: Diagnosis not present

## 2022-05-13 DIAGNOSIS — E119 Type 2 diabetes mellitus without complications: Secondary | ICD-10-CM | POA: Diagnosis not present

## 2022-05-13 DIAGNOSIS — A6000 Herpesviral infection of urogenital system, unspecified: Secondary | ICD-10-CM | POA: Diagnosis not present

## 2022-05-13 DIAGNOSIS — E782 Mixed hyperlipidemia: Secondary | ICD-10-CM | POA: Diagnosis not present

## 2022-05-13 DIAGNOSIS — M5136 Other intervertebral disc degeneration, lumbar region: Secondary | ICD-10-CM | POA: Diagnosis not present

## 2022-05-13 DIAGNOSIS — C61 Malignant neoplasm of prostate: Secondary | ICD-10-CM | POA: Diagnosis not present

## 2022-05-13 DIAGNOSIS — I6521 Occlusion and stenosis of right carotid artery: Secondary | ICD-10-CM | POA: Diagnosis not present

## 2022-05-13 DIAGNOSIS — Z0000 Encounter for general adult medical examination without abnormal findings: Secondary | ICD-10-CM | POA: Diagnosis not present

## 2022-05-13 DIAGNOSIS — Z1331 Encounter for screening for depression: Secondary | ICD-10-CM | POA: Diagnosis not present

## 2022-05-13 DIAGNOSIS — I700 Atherosclerosis of aorta: Secondary | ICD-10-CM | POA: Diagnosis not present

## 2022-05-13 DIAGNOSIS — G629 Polyneuropathy, unspecified: Secondary | ICD-10-CM | POA: Diagnosis not present

## 2022-05-19 ENCOUNTER — Ambulatory Visit: Payer: PPO | Admitting: Dermatology

## 2022-06-25 DIAGNOSIS — H35362 Drusen (degenerative) of macula, left eye: Secondary | ICD-10-CM | POA: Diagnosis not present

## 2022-06-25 DIAGNOSIS — H2511 Age-related nuclear cataract, right eye: Secondary | ICD-10-CM | POA: Diagnosis not present

## 2022-06-25 DIAGNOSIS — H2512 Age-related nuclear cataract, left eye: Secondary | ICD-10-CM | POA: Diagnosis not present

## 2022-07-07 DIAGNOSIS — H2513 Age-related nuclear cataract, bilateral: Secondary | ICD-10-CM | POA: Diagnosis not present

## 2022-07-07 DIAGNOSIS — H2512 Age-related nuclear cataract, left eye: Secondary | ICD-10-CM | POA: Diagnosis not present

## 2022-07-09 ENCOUNTER — Encounter: Payer: Self-pay | Admitting: Anesthesiology

## 2022-07-20 ENCOUNTER — Encounter: Payer: Self-pay | Admitting: Ophthalmology

## 2022-07-20 ENCOUNTER — Other Ambulatory Visit: Payer: Self-pay | Admitting: Gastroenterology

## 2022-07-20 DIAGNOSIS — K219 Gastro-esophageal reflux disease without esophagitis: Secondary | ICD-10-CM | POA: Diagnosis not present

## 2022-07-20 DIAGNOSIS — K52832 Lymphocytic colitis: Secondary | ICD-10-CM | POA: Diagnosis not present

## 2022-07-20 DIAGNOSIS — K862 Cyst of pancreas: Secondary | ICD-10-CM

## 2022-07-21 ENCOUNTER — Encounter: Payer: Self-pay | Admitting: Ophthalmology

## 2022-07-21 NOTE — Anesthesia Preprocedure Evaluation (Deleted)
Anesthesia Evaluation    Airway        Dental   Pulmonary sleep apnea , Current Smoker Has sleep apnea, can't tolerate CPAP, has referral to ENT for inspire device  02-26-20 Moderate Obstructive Airways Disease without Significant Broncho-Dilator Response Moderate Restrictive lung disease           Cardiovascular hypertension,    06-19-19 IMPRESSION: 1. Stable bilateral pulmonary nodules as detailed above. A follow-up CT of the chest is recommended in 15-21 months to confirm ongoing stability. 2. Relatively stable ascending thoracic aortic aneurysm measuring approximately 4.3 cm. Recommend annual imaging followup by CTA or MRA. This recommendation follows 2010 ACCF/AHA/AATS/ACR/ASA/SCA/SCAI/SIR/STS/SVM Guidelines for the Diagnosis and Management of Patients with Thoracic Aortic Disease. Circulation. 2010; 121: W098-J191. Aortic aneurysm NOS (ICD10-I71.9) 3. Hepatic steatosis.   Aortic Atherosclerosis (ICD10-I70.0). Dr. Katherine Mantle    Neuro/Psych 2023 from [facility] neuro clinic:  Patient with PMH of prostate cancer, HTN, aortic atherosclerosis, thoracic aortic anerusym, pulmonary nodules, HLD, T2DM, hepatic steatosis, pancreatic cyst, with neuropathy in legs and imbalance. No longer taking gabapentin. Endorsing new memory loss. - memory score today was 21/30. - Symptoms consistent with mild memory loss. - Check labs today for memory loss. B12, TSH, RPR - Recommend head CT or brain MRI to evaluate memory loss. Patient declined today. - referral to ENT for [facility] device consult. Patient unable to tolerate CPAP. -Start aricept 5mg  once daily for memory loss. - Encouraged patient to complete games, puzzles, or read to stimulate the brain.  - We encouraged patient to stay physically active and exercise on regular basis. Exercise can be very beneficial in many neurological conditions. - Will consider PT, restarting  gabapentin in the future.      GI/Hepatic ,GERD  ,,  Endo/Other    Renal/GU      Musculoskeletal   Abdominal   Peds  Hematology   Anesthesia Other Findings Prostate CA (HCC)  Hypertension Chronic GERD  Melanoma (HCC)  Basal cell carcinoma Squamous cell carcinoma of skin  Actinic keratosis    Reproductive/Obstetrics                             Anesthesia Physical Anesthesia Plan  ASA: 3  Anesthesia Plan:    Post-op Pain Management:    Induction:   PONV Risk Score and Plan:   Airway Management Planned:   Additional Equipment:   Intra-op Plan:   Post-operative Plan:   Informed Consent:   Plan Discussed with:   Anesthesia Plan Comments:         Anesthesia Quick Evaluation

## 2022-07-22 NOTE — Discharge Instructions (Signed)

## 2022-07-23 ENCOUNTER — Ambulatory Visit: Payer: PPO

## 2022-07-23 ENCOUNTER — Other Ambulatory Visit: Payer: Self-pay | Admitting: Student

## 2022-07-23 DIAGNOSIS — I6521 Occlusion and stenosis of right carotid artery: Secondary | ICD-10-CM | POA: Diagnosis not present

## 2022-07-23 DIAGNOSIS — I700 Atherosclerosis of aorta: Secondary | ICD-10-CM | POA: Diagnosis not present

## 2022-07-23 DIAGNOSIS — H269 Unspecified cataract: Secondary | ICD-10-CM | POA: Diagnosis not present

## 2022-07-23 DIAGNOSIS — I10 Essential (primary) hypertension: Secondary | ICD-10-CM | POA: Diagnosis not present

## 2022-07-23 DIAGNOSIS — R918 Other nonspecific abnormal finding of lung field: Secondary | ICD-10-CM | POA: Diagnosis not present

## 2022-07-23 DIAGNOSIS — I7121 Aneurysm of the ascending aorta, without rupture: Secondary | ICD-10-CM

## 2022-07-23 DIAGNOSIS — K760 Fatty (change of) liver, not elsewhere classified: Secondary | ICD-10-CM | POA: Diagnosis not present

## 2022-07-23 DIAGNOSIS — Z01818 Encounter for other preprocedural examination: Secondary | ICD-10-CM | POA: Diagnosis not present

## 2022-07-23 DIAGNOSIS — K862 Cyst of pancreas: Secondary | ICD-10-CM | POA: Diagnosis not present

## 2022-07-23 DIAGNOSIS — R413 Other amnesia: Secondary | ICD-10-CM | POA: Diagnosis not present

## 2022-07-24 ENCOUNTER — Other Ambulatory Visit: Payer: Self-pay | Admitting: Gastroenterology

## 2022-07-24 ENCOUNTER — Ambulatory Visit
Admission: RE | Admit: 2022-07-24 | Discharge: 2022-07-24 | Disposition: A | Discharge status: Home / Self Care | Payer: PPO | Source: Ambulatory Visit | Attending: Gastroenterology | Admitting: Gastroenterology

## 2022-07-24 DIAGNOSIS — N281 Cyst of kidney, acquired: Secondary | ICD-10-CM | POA: Diagnosis not present

## 2022-07-24 DIAGNOSIS — K862 Cyst of pancreas: Secondary | ICD-10-CM

## 2022-07-24 DIAGNOSIS — R935 Abnormal findings on diagnostic imaging of other abdominal regions, including retroperitoneum: Secondary | ICD-10-CM | POA: Diagnosis not present

## 2022-07-24 DIAGNOSIS — K7689 Other specified diseases of liver: Secondary | ICD-10-CM | POA: Diagnosis not present

## 2022-07-24 MED ORDER — GADOBUTROL 1 MMOL/ML IV SOLN
10.0000 mL | Freq: Once | INTRAVENOUS | Status: AC | PRN
  Administered 2022-07-24: 10 mL via INTRAVENOUS

## 2022-07-27 ENCOUNTER — Ambulatory Visit: Admission: RE | Admit: 2022-07-27 | Payer: PPO | Source: Home / Self Care | Admitting: Ophthalmology

## 2022-07-27 ENCOUNTER — Ambulatory Visit
Admission: RE | Admit: 2022-07-27 | Discharge: 2022-07-27 | Disposition: A | Discharge status: Home / Self Care | Payer: PPO | Source: Ambulatory Visit | Attending: Student | Admitting: Student

## 2022-07-27 DIAGNOSIS — I7121 Aneurysm of the ascending aorta, without rupture: Secondary | ICD-10-CM

## 2022-07-27 DIAGNOSIS — I700 Atherosclerosis of aorta: Secondary | ICD-10-CM | POA: Diagnosis not present

## 2022-07-27 HISTORY — DX: Polyneuropathy, unspecified: G62.9

## 2022-07-27 HISTORY — DX: Other abnormalities of gait and mobility: R26.89

## 2022-07-27 HISTORY — DX: Sleep apnea, unspecified: G47.30

## 2022-07-27 HISTORY — DX: Thoracic aortic aneurysm, without rupture, unspecified: I71.20

## 2022-07-27 HISTORY — DX: Fatty (change of) liver, not elsewhere classified: K76.0

## 2022-07-27 HISTORY — DX: Mild cognitive impairment of uncertain or unknown etiology: G31.84

## 2022-07-27 SURGERY — PHACOEMULSIFICATION, CATARACT, WITH IOL INSERTION
Anesthesia: Topical | Laterality: Left

## 2022-07-27 MED ORDER — IOPAMIDOL (ISOVUE-370) INJECTION 76%
75.0000 mL | Freq: Once | INTRAVENOUS | Status: AC | PRN
  Administered 2022-07-27: 75 mL via INTRAVENOUS

## 2022-07-29 ENCOUNTER — Encounter: Payer: Self-pay | Admitting: Ophthalmology

## 2022-07-29 NOTE — Discharge Instructions (Signed)

## 2022-08-03 ENCOUNTER — Other Ambulatory Visit: Payer: Self-pay

## 2022-08-03 ENCOUNTER — Ambulatory Visit: Payer: PPO | Admitting: Anesthesiology

## 2022-08-03 ENCOUNTER — Ambulatory Visit
Admission: RE | Admit: 2022-08-03 | Discharge: 2022-08-03 | Disposition: A | Discharge status: Home / Self Care | Payer: PPO | Attending: Ophthalmology | Admitting: Ophthalmology

## 2022-08-03 ENCOUNTER — Encounter
Admission: RE | Disposition: A | Discharge status: Home / Self Care | Payer: Self-pay | Source: Home / Self Care | Attending: Ophthalmology

## 2022-08-03 ENCOUNTER — Encounter: Payer: Self-pay | Admitting: Ophthalmology

## 2022-08-03 DIAGNOSIS — H2512 Age-related nuclear cataract, left eye: Secondary | ICD-10-CM | POA: Insufficient documentation

## 2022-08-03 DIAGNOSIS — I700 Atherosclerosis of aorta: Secondary | ICD-10-CM | POA: Diagnosis not present

## 2022-08-03 DIAGNOSIS — K219 Gastro-esophageal reflux disease without esophagitis: Secondary | ICD-10-CM | POA: Insufficient documentation

## 2022-08-03 DIAGNOSIS — F17210 Nicotine dependence, cigarettes, uncomplicated: Secondary | ICD-10-CM | POA: Insufficient documentation

## 2022-08-03 DIAGNOSIS — K760 Fatty (change of) liver, not elsewhere classified: Secondary | ICD-10-CM | POA: Diagnosis not present

## 2022-08-03 DIAGNOSIS — I10 Essential (primary) hypertension: Secondary | ICD-10-CM | POA: Insufficient documentation

## 2022-08-03 DIAGNOSIS — Z8546 Personal history of malignant neoplasm of prostate: Secondary | ICD-10-CM | POA: Insufficient documentation

## 2022-08-03 DIAGNOSIS — G4730 Sleep apnea, unspecified: Secondary | ICD-10-CM | POA: Insufficient documentation

## 2022-08-03 DIAGNOSIS — Z85820 Personal history of malignant melanoma of skin: Secondary | ICD-10-CM | POA: Diagnosis not present

## 2022-08-03 DIAGNOSIS — Z85828 Personal history of other malignant neoplasm of skin: Secondary | ICD-10-CM | POA: Diagnosis not present

## 2022-08-03 DIAGNOSIS — F17200 Nicotine dependence, unspecified, uncomplicated: Secondary | ICD-10-CM | POA: Insufficient documentation

## 2022-08-03 DIAGNOSIS — Z79899 Other long term (current) drug therapy: Secondary | ICD-10-CM | POA: Diagnosis not present

## 2022-08-03 HISTORY — PX: CATARACT EXTRACTION W/PHACO: SHX586

## 2022-08-03 SURGERY — PHACOEMULSIFICATION, CATARACT, WITH IOL INSERTION
Anesthesia: Monitor Anesthesia Care | Site: Eye | Laterality: Left

## 2022-08-03 MED ORDER — ARMC OPHTHALMIC DILATING DROPS
1.0000 | OPHTHALMIC | Status: DC | PRN
  Administered 2022-08-03 (×3): 1 via OPHTHALMIC

## 2022-08-03 MED ORDER — SIGHTPATH DOSE#1 NA CHONDROIT SULF-NA HYALURON 40-17 MG/ML IO SOLN
INTRAOCULAR | Status: DC | PRN
  Administered 2022-08-03: 1 mL via INTRAOCULAR

## 2022-08-03 MED ORDER — SIGHTPATH DOSE#1 BSS IO SOLN
INTRAOCULAR | Status: DC | PRN
  Administered 2022-08-03: 1 mL via INTRAMUSCULAR

## 2022-08-03 MED ORDER — MIDAZOLAM HCL 2 MG/2ML IJ SOLN
INTRAMUSCULAR | Status: DC | PRN
  Administered 2022-08-03: 1 mg via INTRAVENOUS

## 2022-08-03 MED ORDER — SIGHTPATH DOSE#1 BSS IO SOLN
INTRAOCULAR | Status: DC | PRN
  Administered 2022-08-03: 15 mL

## 2022-08-03 MED ORDER — MOXIFLOXACIN HCL 0.5 % OP SOLN
OPHTHALMIC | Status: DC | PRN
  Administered 2022-08-03: .2 mL via OPHTHALMIC

## 2022-08-03 MED ORDER — FENTANYL CITRATE (PF) 100 MCG/2ML IJ SOLN
INTRAMUSCULAR | Status: DC | PRN
  Administered 2022-08-03: 50 ug via INTRAVENOUS

## 2022-08-03 MED ORDER — BRIMONIDINE TARTRATE-TIMOLOL 0.2-0.5 % OP SOLN
OPHTHALMIC | Status: DC | PRN
  Administered 2022-08-03: 1 [drp] via OPHTHALMIC

## 2022-08-03 MED ORDER — LACTATED RINGERS IV SOLN: INTRAVENOUS | Status: DC

## 2022-08-03 MED ORDER — SIGHTPATH DOSE#1 BSS IO SOLN
INTRAOCULAR | Status: DC | PRN
  Administered 2022-08-03: 43 mL via OPHTHALMIC

## 2022-08-03 MED ORDER — TETRACAINE HCL 0.5 % OP SOLN
1.0000 [drp] | OPHTHALMIC | Status: DC | PRN
  Administered 2022-08-03 (×3): 1 [drp] via OPHTHALMIC

## 2022-08-03 SURGICAL SUPPLY — 12 items
ANGLE REVERSE CUT SHRT 25GA (CUTTER) ×1
CATARACT SUITE SIGHTPATH (MISCELLANEOUS) ×1 IMPLANT
CYSTOTOME ANGL RVRS SHRT 25G (CUTTER) ×1 IMPLANT
CYSTOTOME ANGL RVRS SHRT 25GA (CUTTER) ×1 IMPLANT
FEE CATARACT SUITE SIGHTPATH (MISCELLANEOUS) ×1 IMPLANT
GLOVE BIOGEL PI IND STRL 8 (GLOVE) ×1 IMPLANT
GLOVE SURG ENC TEXT LTX SZ8 (GLOVE) ×1 IMPLANT
LENS CLAREON PANOPTIX 20.0 ×1 IMPLANT
LENS IOL CLRN PAN 4 20.0 IMPLANT
NDL FILTER BLUNT 18X1 1/2 (NEEDLE) ×1 IMPLANT
NEEDLE FILTER BLUNT 18X1 1/2 (NEEDLE) ×1 IMPLANT
SYR 3ML LL SCALE MARK (SYRINGE) ×1 IMPLANT

## 2022-08-03 NOTE — Op Note (Signed)
PREOPERATIVE DIAGNOSIS:  Nuclear sclerotic cataract of the left eye.   POSTOPERATIVE DIAGNOSIS:  Nuclear sclerotic cataract of the left eye.   OPERATIVE PROCEDURE: Procedure(s): CATARACT EXTRACTION PHACO AND INTRAOCULAR LENS PLACEMENT (IOC) LEFT CLAREON PANOPTIX TORIC  9.11  00:47.4   SURGEON:  Galen Manila, MD.   ANESTHESIA: 1.      Managed anesthesia care. 2.     0.79ml os Shugarcaine was instilled following the paracentesis [email or web address]   COMPLICATIONS:  None.   TECHNIQUE:   Stop and chop    DESCRIPTION OF PROCEDURE:  The patient was examined and consented in the preoperative holding area where the aforementioned topical anesthesia was applied to the left eye.  The patient was brought back to the Operating Room where he was sat upright on the gurney and given a target to fixate upon while the eye was marked at the 3:00 and 9:00 position.  The patient was then reclined on the operating table.  The eye was prepped and draped in the usual sterile ophthalmic fashion and a lid speculum was placed. A paracentesis was created with the side port blade and the anterior chamber was filled with viscoelastic. A near clear corneal incision was performed with the steel keratome. A continuous curvilinear capsulorrhexis was performed with a cystotome followed by the capsulorrhexis forceps. Hydrodissection and hydrodelineation were carried out with BSS on a blunt cannula. The lens was removed in a stop and chop technique and the remaining cortical material was removed with the irrigation-aspiration handpiece. The eye was inflated with viscoelastic and the CNWTT lens was placed in the eye and rotated to within a few degrees of the predetermined orientation.  The remaining viscoelastic was removed from the eye.  The Sinskey hook was used to rotate the toric lens into its final resting place at 168 degrees.  0.1 ml of Vigamox was placed in the anterior chamber. The eye was inflated to a physiologic pressure and  found to be watertight.  The eye was dressed with Vigamox. And CombiganThe patient was given protective glasses to wear throughout the day and a shield with which to sleep tonight. The patient was also given drops with which to begin a drop regimen today and will follow-up with me in one day. Implant Name Type Inv. Item Serial No. Manufacturer Lot No. LRB No. Used Action  LENS CLAREON PANOPTIX 20.0 - [id number]  LENS CLAREON PANOPTIX 20.0 [id number] [employer]  Left 1 Implanted   Procedure(s): CATARACT EXTRACTION PHACO AND INTRAOCULAR LENS PLACEMENT (IOC) LEFT CLAREON PANOPTIX TORIC  9.11  00:47.4 (Left)  Electronically signed: Galen Manila 6/18/20249:48 AM

## 2022-08-03 NOTE — Transfer of Care (Signed)
Immediate Anesthesia Transfer of Care Note  Patient: Jason Washington  Procedure(s) Performed: CATARACT EXTRACTION PHACO AND INTRAOCULAR LENS PLACEMENT (IOC) LEFT CLAREON PANOPTIX TORIC  9.11  00:47.4 (Left: Eye)  Patient Location: PACU  Anesthesia Type: MAC  Level of Consciousness: awake, alert  and patient cooperative  Airway and Oxygen Therapy: Patient Spontanous Breathing and Patient connected to supplemental oxygen  Post-op Assessment: Post-op Vital signs reviewed, Patient's Cardiovascular Status Stable, Respiratory Function Stable, Patent Airway and No signs of Nausea or vomiting  Post-op Vital Signs: Reviewed and stable  Complications: No notable events documented.

## 2022-08-03 NOTE — Anesthesia Preprocedure Evaluation (Addendum)
Anesthesia Evaluation  Patient identified by MRN, date of birth, ID band Patient awake    Reviewed: Allergy & Precautions, H&P , NPO status , Patient's Chart, lab work & pertinent test results  Airway Mallampati: IV  TM Distance: >3 FB Neck ROM: Full  Mouth opening: Limited Mouth Opening Comment: Very thick neck, limited oral opening Dental no notable dental hx.    Pulmonary sleep apnea , Current Smoker and Patient abstained from smoking. Patient cannot tolerate CPAP, discussed risks untreated sleep apnea and discussed possible use of Inspire. Urged patient to discuss with his doctor about Earnest Bailey or other treatment for sleep apnea.     Pulmonary exam normal breath sounds clear to auscultation       Cardiovascular hypertension, Normal cardiovascular exam Rhythm:Regular Rate:Normal  07-27-22 IMPRESSION: 1. Unchanged 4.2 cm ascending thoracic aortic aneurysm. Recommend annual imaging followup by CTA or MRA. This recommendation follows 2010 ACCF/AHA/AATS/ACR/ASA/SCA/SCAI/SIR/STS/SVM Guidelines for the Diagnosis and Management of Patients with Thoracic Aortic Disease. Circulation. 2010; 121: Z610-R604. Aortic aneurysm NOS (ICD10-I71.9) 2.  Aortic Atherosclerosis (ICD10-I70.0).      Neuro/Psych negative neurological ROS  negative psych ROS   GI/Hepatic Neg liver ROS,GERD  ,,  Endo/Other  negative endocrine ROS    Renal/GU negative Renal ROS  negative genitourinary   Musculoskeletal negative musculoskeletal ROS (+)    Abdominal   Peds negative pediatric ROS (+)  Hematology negative hematology ROS (+)   Anesthesia Other Findings Prostate CA (HCC)  Hypertension Chronic GERD  Genital herpes Melanoma (HCC)  Basal cell carcinoma Squamous cell carcinoma of skin  Actinic keratosis Sleep apnea  Mild cognitive impairment with memory loss Hepatic steatosis  Imbalance Neuropathy  Thoracic aortic aneurysm 4.2 cm     Reproductive/Obstetrics negative OB ROS                             Anesthesia Physical Anesthesia Plan  ASA: 3  Anesthesia Plan: MAC   Post-op Pain Management:    Induction: Intravenous  PONV Risk Score and Plan:   Airway Management Planned: Natural Airway and Nasal Cannula  Additional Equipment:   Intra-op Plan:   Post-operative Plan:   Informed Consent: I have reviewed the patients History and Physical, chart, labs and discussed the procedure including the risks, benefits and alternatives for the proposed anesthesia with the patient or authorized representative who has indicated his/her understanding and acceptance.     Dental Advisory Given  Plan Discussed with: Anesthesiologist, CRNA and Surgeon  Anesthesia Plan Comments: (Patient consented for risks of anesthesia including but not limited to:  - adverse reactions to medications - damage to eyes, teeth, lips or other oral mucosa - nerve damage due to positioning  - sore throat or hoarseness - Damage to heart, brain, nerves, lungs, other parts of body or loss of life  Patient voiced understanding.)       Anesthesia Quick Evaluation

## 2022-08-03 NOTE — Anesthesia Postprocedure Evaluation (Signed)
Anesthesia Post Note  Patient: Jason Washington  Procedure(s) Performed: CATARACT EXTRACTION PHACO AND INTRAOCULAR LENS PLACEMENT (IOC) LEFT CLAREON PANOPTIX TORIC  9.11  00:47.4 (Left: Eye)  Patient location during evaluation: PACU Anesthesia Type: MAC Level of consciousness: awake and alert Pain management: pain level controlled Vital Signs Assessment: post-procedure vital signs reviewed and stable Respiratory status: spontaneous breathing, nonlabored ventilation, respiratory function stable and patient connected to nasal cannula oxygen Cardiovascular status: stable and blood pressure returned to baseline Postop Assessment: no apparent nausea or vomiting Anesthetic complications: no   No notable events documented.   Last Vitals:  Vitals:   08/03/22 0951 08/03/22 0953  BP: (!) 158/71   Pulse: 62 60  Resp: 16 18  Temp:    SpO2: 93% 94%    Last Pain:  Vitals:   08/03/22 0951  TempSrc:   PainSc: 0-No pain                 Marisue Humble

## 2022-08-03 NOTE — H&P (Signed)
[facility]   Primary Care Physician:  Carren Rang, PA-C Ophthalmologist: Dr. Druscilla Brownie  Pre-Procedure History & Physical: HPI:  Jason Washington is a 77 y.o. male here for cataract surgery.   Past Medical History:  Diagnosis Date   Actinic keratosis    Basal cell carcinoma 11/21/2019   right superior lat pectoral/ [facility]   Chronic GERD    Genital herpes    Hepatic steatosis    Hypertension    Imbalance    Melanoma (HCC)    R knee several years ago    Mild cognitive impairment with memory loss    Neuropathy    Prostate CA (HCC)    Sleep apnea    Squamous cell carcinoma of skin 12/22/2021   left forearm-anterior treated wiht [facility]   Thoracic aortic aneurysm [facility])     Past Surgical History:  Procedure Laterality Date   APPENDECTOMY     COLONOSCOPY WITH PROPOFOL N/A 10/07/2021   Procedure: COLONOSCOPY WITH PROPOFOL;  Surgeon: Toledo, Boykin Nearing, MD;  Location: [facility] ENDOSCOPY;  Service: Endoscopy;  Laterality: N/A;   HERNIA REPAIR     PROSTATE SURGERY     TONSILLECTOMY      Prior to Admission medications   Medication Sig Start Date End Date Taking? Authorizing Provider  acyclovir (ZOVIRAX) 400 MG tablet Take 400 mg by mouth 2 (two) times daily. 01/04/19  Yes [provider]  amLODipine (NORVASC) 5 MG tablet amlodipine 5 mg tablet 09/28/13  Yes [provider]  aspirin EC 81 MG tablet Take 81 mg by mouth daily. Swallow whole.   Yes [provider]  atorvastatin (LIPITOR) 40 MG tablet atorvastatin 40 mg tablet 04/13/18  Yes [provider]  budesonide (ENTOCORT EC) 3 MG 24 hr capsule Take 9 mg by mouth daily.   Yes [provider]  cetirizine (ZYRTEC) 10 MG tablet Take 10 mg by mouth daily. Barkley & Barbette Merino   Yes [provider]  donepezil (ARICEPT) 10 MG tablet Take 10 mg by mouth at bedtime.   Yes [provider]  hydrochlorothiazide (HYDRODIURIL) 25 MG tablet hydrochlorothiazide 25 mg tablet 09/13/13   Yes [provider]  ketoconazole (NIZORAL) 2 % cream Apply 1 Application topically at bedtime. Qhs to feet 01/13/22  Yes Deirdre Evener, MD  mupirocin ointment (BACTROBAN) 2 % Apply 1 Application topically daily. 02/22/22  Yes Deirdre Evener, MD  omeprazole (PRILOSEC) 40 MG capsule omeprazole 40 mg capsule,delayed release 09/04/13  Yes [provider]  valsartan (DIOVAN) 80 MG tablet Take 80 mg by mouth daily.   Yes [provider]    Allergies as of 07/29/2022   (No Known Allergies)    History reviewed. No pertinent family history.  Social History   Socioeconomic History   Marital status: Married    Spouse name: Not on file   Number of children: Not on file   Years of education: Not on file   Highest education level: Not on file  Occupational History   Not on file  Tobacco Use   Smoking status: Every Day    Packs/day: 1.00    Years: 10.00    Additional pack years: 0.00    Total pack years: 10.00    Types: Cigarettes   Smokeless tobacco: Never   Tobacco comments:    2 cigs daily- 10/23/2019  Vaping Use   Vaping Use: Never used  Substance and Sexual Activity   Alcohol use: Yes    Comment: rare  Drug use: Never   Sexual activity: Not on file  Other Topics Concern   Not on file  Social History Narrative   Not on file   Social Determinants of Health   Financial Resource Strain: Not on file  Food Insecurity: Not on file  Transportation Needs: Not on file  Physical Activity: Not on file  Stress: Not on file  Social Connections: Not on file  Intimate Partner Violence: Not on file    Review of Systems: See HPI, otherwise negative ROS  Physical Exam: BP (!) 169/83   Pulse 65   Temp 98 F (36.7 C) (Temporal)   Resp 16   Ht 5\' 11"  (1.803 m)   Wt 115.2 kg   SpO2 94%   BMI 35.43 kg/m  General:   Alert, cooperative in NAD Head:  Normocephalic and atraumatic. Respiratory:  Normal work of breathing. Cardiovascular:   RRR  Impression/Plan: Jason Washington is here for cataract surgery.  Risks, benefits, limitations, and alternatives regarding cataract surgery have been reviewed with the patient.  Questions have been answered.  All parties agreeable.   Galen Manila, MD  08/03/2022, 9:25 AM

## 2022-08-04 ENCOUNTER — Other Ambulatory Visit: Payer: Self-pay

## 2022-08-04 ENCOUNTER — Encounter: Payer: Self-pay | Admitting: Ophthalmology

## 2022-08-09 NOTE — Anesthesia Preprocedure Evaluation (Addendum)
Anesthesia Evaluation  Patient identified by MRN, date of birth, ID band Patient awake    Reviewed: Allergy & Precautions, H&P , NPO status , Patient's Chart, lab work & pertinent test results  Airway Mallampati: IV  TM Distance: >3 FB Neck ROM: Full    Dental no notable dental hx.    Pulmonary neg pulmonary ROS, Current Smoker and Patient abstained from smoking. Patient cannot tolerate CPAP, discussed risks untreated sleep apnea and discussed possible use of Inspire. Urged patient to discuss with his doctor about Earnest Bailey or other treatment for sleep apnea.     Pulmonary exam normal breath sounds clear to auscultation       Cardiovascular hypertension, negative cardio ROS Normal cardiovascular exam Rhythm:Regular Rate:Normal   07-27-22 IMPRESSION: 1. Unchanged 4.2 cm ascending thoracic aortic aneurysm. Recommend annual imaging followup by CTA or MRA. This recommendation follows 2010 ACCF/AHA/AATS/ACR/ASA/SCA/SCAI/SIR/STS/SVM Guidelines for the Diagnosis and Management of Patients with Thoracic Aortic Disease. Circulation. 2010; 121: Z610-R604. Aortic aneurysm NOS (ICD10-I71.9) 2.  Aortic Atherosclerosis (ICD10-I70.0).    Neuro/Psych negative neurological ROS  negative psych ROS   GI/Hepatic negative GI ROS, Neg liver ROS,,,  Endo/Other  negative endocrine ROS    Renal/GU negative Renal ROS  negative genitourinary   Musculoskeletal negative musculoskeletal ROS (+)    Abdominal   Peds negative pediatric ROS (+)  Hematology negative hematology ROS (+)   Anesthesia Other Findings  Previous cataract surgeyr 08-03-22  Prostate CA [facility])  Hypertension Chronic GERD  Genital herpes Melanoma (HCC)  Basal cell carcinoma Squamous cell carcinoma of skin  Actinic keratosis Sleep apnea  Mild cognitive impairment with memory loss Hepatic steatosis  Imbalance Neuropathy  Thoracic aortic aneurysm (HCC)     Reproductive/Obstetrics negative OB ROS                             Anesthesia Physical Anesthesia Plan  ASA: 3  Anesthesia Plan: MAC   Post-op Pain Management:    Induction: Intravenous  PONV Risk Score and Plan:   Airway Management Planned: Natural Airway and Nasal Cannula  Additional Equipment:   Intra-op Plan:   Post-operative Plan:   Informed Consent: I have reviewed the patients History and Physical, chart, labs and discussed the procedure including the risks, benefits and alternatives for the proposed anesthesia with the patient or authorized representative who has indicated his/her understanding and acceptance.     Dental Advisory Given  Plan Discussed with: Anesthesiologist, CRNA and Surgeon  Anesthesia Plan Comments: (Patient consented for risks of anesthesia including but not limited to:  - adverse reactions to medications - damage to eyes, teeth, lips or other oral mucosa - nerve damage due to positioning  - sore throat or hoarseness - Damage to heart, brain, nerves, lungs, other parts of body or loss of life  Patient voiced understanding.)        Anesthesia Quick Evaluation

## 2022-08-10 ENCOUNTER — Ambulatory Visit: Admit: 2022-08-10 | Payer: PPO | Admitting: Ophthalmology

## 2022-08-10 SURGERY — PHACOEMULSIFICATION, CATARACT, WITH IOL INSERTION
Anesthesia: Topical | Laterality: Right

## 2022-08-12 ENCOUNTER — Ambulatory Visit: Payer: PPO | Admitting: Dermatology

## 2022-08-12 VITALS — BP 171/96 | HR 68

## 2022-08-12 DIAGNOSIS — Z79899 Other long term (current) drug therapy: Secondary | ICD-10-CM

## 2022-08-12 DIAGNOSIS — Z5111 Encounter for antineoplastic chemotherapy: Secondary | ICD-10-CM | POA: Diagnosis not present

## 2022-08-12 DIAGNOSIS — L570 Actinic keratosis: Secondary | ICD-10-CM | POA: Diagnosis not present

## 2022-08-12 DIAGNOSIS — D485 Neoplasm of uncertain behavior of skin: Secondary | ICD-10-CM

## 2022-08-12 DIAGNOSIS — L821 Other seborrheic keratosis: Secondary | ICD-10-CM | POA: Diagnosis not present

## 2022-08-12 DIAGNOSIS — L820 Inflamed seborrheic keratosis: Secondary | ICD-10-CM

## 2022-08-12 DIAGNOSIS — C44319 Basal cell carcinoma of skin of other parts of face: Secondary | ICD-10-CM | POA: Diagnosis not present

## 2022-08-12 DIAGNOSIS — L578 Other skin changes due to chronic exposure to nonionizing radiation: Secondary | ICD-10-CM

## 2022-08-12 DIAGNOSIS — W908XXA Exposure to other nonionizing radiation, initial encounter: Secondary | ICD-10-CM | POA: Diagnosis not present

## 2022-08-12 DIAGNOSIS — Z7189 Other specified counseling: Secondary | ICD-10-CM

## 2022-08-12 DIAGNOSIS — D229 Melanocytic nevi, unspecified: Secondary | ICD-10-CM

## 2022-08-12 NOTE — Progress Notes (Signed)
Follow-Up Visit   Subjective  Jason Washington Jason Washington is a 77 y.o. male who presents for the following: Irregular, irritated, skin lesions  The patient has spots, moles and lesions to be evaluated, some may be new or changing and the patient may have concern these could be cancer.  The following portions of the chart were reviewed this encounter and updated as appropriate: medications, allergies, medical history  Review of Systems:  No other skin or systemic complaints except as noted in HPI or Assessment and Plan.  Objective  Well appearing patient in no apparent distress; mood and affect are within normal limits. A focused examination was performed of the following areas: Relevant exam findings are noted in the Assessment and Plan.  Face and arms x 16 (16) Erythematous stuck-on, waxy papule or plaque  Scalp x 17 (17) Erythematous thin papules/macules with gritty scale.   R forearm 1.2 cm Hyperkeratotic papule.  R sideburn 1.5 cm Hyperkeratotic papule.  R tricep 0.6 cm Hyperkeratotic papule.   Assessment & Plan   ACTINIC DAMAGE WITH PRECANCEROUS ACTINIC KERATOSES Counseling for Topical Chemotherapy Management: Patient exhibits: - Severe, confluent actinic changes with pre-cancerous actinic keratoses that is secondary to cumulative UV radiation exposure over time - Condition that is severe; chronic, not at goal. - diffuse scaly erythematous macules and papules with underlying dyspigmentation - Discussed Prescription "Field Treatment" topical Chemotherapy for Severe, Chronic Confluent Actinic Changes with Pre-Cancerous Actinic Keratoses Field treatment involves treatment of an entire area of skin that has confluent Actinic Changes (Sun/ Ultraviolet light damage) and PreCancerous Actinic Keratoses by method of PhotoDynamic Therapy (PDT) and/or prescription Topical Chemotherapy agents such as 5-fluorouracil, 5-fluorouracil/calcipotriene, and/or imiquimod.  The purpose is to  decrease the number of clinically evident and subclinical PreCancerous lesions to prevent progression to development of skin cancer by chemically destroying early precancer changes that may or may not be visible.  It has been shown to reduce the risk of developing skin cancer in the treated area. As a result of treatment, redness, scaling, crusting, and open sores may occur during treatment course. One or more than one of these methods may be used and may have to be used several times to control, suppress and eliminate the PreCancerous changes. Discussed treatment course, expected reaction, and possible side effects. - Recommend daily broad spectrum sunscreen SPF 30+ to sun-exposed areas, reapply every 2 hours as needed.  - Staying in the shade or wearing long sleeves, sun glasses (UVA+UVB protection) and wide brim hats (4-inch brim around the entire circumference of the hat) are also recommended. - Call for new or changing lesions. - In one month recommend red light PDT with debridement on the scalp.   SEBORRHEIC KERATOSIS - Stuck-on, waxy, tan-brown papules and/or plaques  - Benign-appearing - Discussed benign etiology and prognosis. - Observe - Call for any changes  Inflamed seborrheic keratosis (16) Face and arms x 16  Symptomatic, irritating, patient would like treated.   Destruction of lesion - Face and arms x 16 Complexity: simple   Destruction method: cryotherapy   Informed consent: discussed and consent obtained   Timeout:  patient name, date of birth, surgical site, and procedure verified Lesion destroyed using liquid nitrogen: Yes   Region frozen until ice ball extended beyond lesion: Yes   Outcome: patient tolerated procedure well with no complications   Post-procedure details: wound care instructions given    AK (actinic keratosis) (17) Scalp x 17  Destruction of lesion - Scalp x 17 Complexity: simple  Destruction method: cryotherapy   Informed consent: discussed and  consent obtained   Timeout:  patient name, date of birth, surgical site, and procedure verified Lesion destroyed using liquid nitrogen: Yes   Region frozen until ice ball extended beyond lesion: Yes   Outcome: patient tolerated procedure well with no complications   Post-procedure details: wound care instructions given    Neoplasm of uncertain behavior of skin (3) R forearm  Epidermal / dermal shaving  Lesion diameter (cm):  1.2 Informed consent: discussed and consent obtained   Timeout: patient name, date of birth, surgical site, and procedure verified   Procedure prep:  Patient was prepped and draped in usual sterile fashion Prep type:  Isopropyl alcohol Anesthesia: the lesion was anesthetized in a standard fashion   Anesthetic:  1% lidocaine w/ epinephrine 1-100,000 buffered w/ 8.4% NaHCO3 Instrument used: flexible razor blade   Hemostasis achieved with: pressure, aluminum chloride and electrodesiccation   Outcome: patient tolerated procedure well   Post-procedure details: sterile dressing applied and wound care instructions given   Dressing type: bandage and petrolatum    Specimen 1 - Surgical pathology Differential Diagnosis: D48.5 ISK vs other Check Margins: No  R sideburn  Epidermal / dermal shaving  Lesion diameter (cm):  1.5 Informed consent: discussed and consent obtained   Timeout: patient name, date of birth, surgical site, and procedure verified   Procedure prep:  Patient was prepped and draped in usual sterile fashion Prep type:  Isopropyl alcohol Anesthesia: the lesion was anesthetized in a standard fashion   Anesthetic:  1% lidocaine w/ epinephrine 1-100,000 buffered w/ 8.4% NaHCO3 Instrument used: flexible razor blade   Hemostasis achieved with: pressure, aluminum chloride and electrodesiccation   Outcome: patient tolerated procedure well   Post-procedure details: sterile dressing applied and wound care instructions given   Dressing type: bandage and  petrolatum    Specimen 2 - Surgical pathology Differential Diagnosis: D48.5 ISK vs other  Check Margins: No  R tricep  Epidermal / dermal shaving  Lesion diameter (cm):  0.6 Informed consent: discussed and consent obtained   Timeout: patient name, date of birth, surgical site, and procedure verified   Procedure prep:  Patient was prepped and draped in usual sterile fashion Prep type:  Isopropyl alcohol Anesthesia: the lesion was anesthetized in a standard fashion   Anesthetic:  1% lidocaine w/ epinephrine 1-100,000 buffered w/ 8.4% NaHCO3 Instrument used: flexible razor blade   Hemostasis achieved with: pressure, aluminum chloride and electrodesiccation   Outcome: patient tolerated procedure well   Post-procedure details: sterile dressing applied and wound care instructions given   Dressing type: bandage and petrolatum    Specimen 3 - Surgical pathology Differential Diagnosis: D48.5 r/o ISK vs other Check Margins: No   Return for Red light PDT .  Maylene Roes, CMA, am acting as scribe for Armida Sans, MD .  Documentation: I have reviewed the above documentation for accuracy and completeness, and I agree with the above.  Armida Sans, MD

## 2022-08-12 NOTE — Discharge Instructions (Signed)

## 2022-08-12 NOTE — Patient Instructions (Addendum)
Wound Care Instructions  Cleanse wound gently with soap and water once a day then pat dry with clean gauze. Apply a thin coat of Petrolatum (petroleum jelly, "Vaseline") over the wound (unless you have an allergy to this). We recommend that you use a new, sterile tube of Vaseline. Do not pick or remove scabs. Do not remove the yellow or white "healing tissue" from the base of the wound.  Cover the wound with fresh, clean, nonstick gauze and secure with paper tape. You may use Band-Aids in place of gauze and tape if the wound is small enough, but would recommend trimming much of the tape off as there is often too much. Sometimes Band-Aids can irritate the skin.  You should call the office for your biopsy report after 1 week if you have not already been contacted.  If you experience any problems, such as abnormal amounts of bleeding, swelling, significant bruising, significant pain, or evidence of infection, please call the office immediately.  FOR ADULT SURGERY PATIENTS: If you need something for pain relief you may take 1 extra strength Tylenol (acetaminophen) AND 2 Ibuprofen (200mg each) together every 4 hours as needed for pain. (do not take these if you are allergic to them or if you have a reason you should not take them.) Typically, you may only need pain medication for 1 to 3 days.     Due to recent changes in healthcare laws, you may see results of your pathology and/or laboratory studies on MyChart before the doctors have had a chance to review them. We understand that in some cases there may be results that are confusing or concerning to you. Please understand that not all results are received at the same time and often the doctors may need to interpret multiple results in order to provide you with the best plan of care or course of treatment. Therefore, we ask that you please give us 2 business days to thoroughly review all your results before contacting the office for clarification. Should  we see a critical lab result, you will be contacted sooner.   If You Need Anything After Your Visit  If you have any questions or concerns for your doctor, please call our main line at [phone] and press option 4 to reach your doctor's medical assistant. If no one answers, please leave a voicemail as directed and we will return your call as soon as possible. Messages left after 4 pm will be answered the following business day.   You may also send us a message via MyChart. We typically respond to MyChart messages within 1-2 business days.  For prescription refills, please ask your pharmacy to contact our office. Our fax number is [phone].  If you have an urgent issue when the clinic is closed that cannot wait until the next business day, you can page your doctor at the number below.    Please note that while we do our best to be available for urgent issues outside of office hours, we are not available 24/7.   If you have an urgent issue and are unable to reach us, you may choose to seek medical care at your doctor's office, retail clinic, urgent care center, or emergency room.  If you have a medical emergency, please immediately call 911 or go to the emergency department.  Pager Numbers  - Dr. Kowalski: [phone]  - Dr. Moye: [phone]  - Dr. Stewart: [phone]  In the event of inclement weather, please call our main line at   [phone] for an update on the status of any delays or closures.  Dermatology Medication Tips: Please keep the boxes that topical medications come in in order to help keep track of the instructions about where and how to use these. Pharmacies typically print the medication instructions only on the boxes and not directly on the medication tubes.   If your medication is too expensive, please contact our office at [phone] option 4 or send us a message through MyChart.   We are unable to tell what your co-pay for medications will be in  advance as this is different depending on your insurance coverage. However, we may be able to find a substitute medication at lower cost or fill out paperwork to get insurance to cover a needed medication.   If a prior authorization is required to get your medication covered by your insurance company, please allow us 1-2 business days to complete this process.  Drug prices often vary depending on where the prescription is filled and some pharmacies may offer cheaper prices.  The website www.goodrx.com contains coupons for medications through different pharmacies. The prices here do not account for what the cost may be with help from insurance (it may be cheaper with your insurance), but the website can give you the price if you did not use any insurance.  - You can print the associated coupon and take it with your prescription to the pharmacy.  - You may also stop by our office during regular business hours and pick up a GoodRx coupon card.  - If you need your prescription sent electronically to a different pharmacy, notify our office through [place] MyChart or by phone at [phone] option 4.     Si Usted Necesita Algo Despus de Su Visita  Tambin puede enviarnos un mensaje a travs de MyChart. Por lo general respondemos a los mensajes de MyChart en el transcurso de 1 a 2 das hbiles.  Para renovar recetas, por favor pida a su farmacia que se ponga en contacto con nuestra oficina. Nuestro nmero de fax es el [phone].  Si tiene un asunto urgente cuando la clnica est cerrada y que no puede esperar hasta el siguiente da hbil, puede llamar/localizar a su doctor(a) al nmero que aparece a continuacin.   Por favor, tenga en cuenta que aunque hacemos todo lo posible para estar disponibles para asuntos urgentes fuera del horario de oficina, no estamos disponibles las 24 horas del da, los 7 das de la semana.   Si tiene un problema urgente y no puede comunicarse con nosotros, puede  optar por buscar atencin mdica  en el consultorio de su doctor(a), en una clnica privada, en un centro de atencin urgente o en una sala de emergencias.  Si tiene una emergencia mdica, por favor llame inmediatamente al 911 o vaya a la sala de emergencias.  Nmeros de bper  - Dr. Kowalski: [phone]  - Dra. Moye: [phone]  - Dra. Stewart: [phone]  En caso de inclemencias del tiempo, por favor llame a nuestra lnea principal al [phone] para una actualizacin sobre el estado de cualquier retraso o cierre.  Consejos para la medicacin en dermatologa: Por favor, guarde las cajas en las que vienen los medicamentos de uso tpico para ayudarle a seguir las instrucciones sobre dnde y cmo usarlos. Las farmacias generalmente imprimen las instrucciones del medicamento slo en las cajas y no directamente en los tubos del medicamento.   Si su medicamento es muy caro, por favor, pngase en contacto con   nuestra oficina llamando al [phone] y presione la opcin 4 o envenos un mensaje a travs de MyChart.   No podemos decirle cul ser su copago por los medicamentos por adelantado ya que esto es diferente dependiendo de la cobertura de su seguro. Sin embargo, es posible que podamos encontrar un medicamento sustituto a menor costo o llenar un formulario para que el seguro cubra el medicamento que se considera necesario.   Si se requiere una autorizacin previa para que su compaa de seguros cubra su medicamento, por favor permtanos de 1 a 2 das hbiles para completar este proceso.  Los precios de los medicamentos varan con frecuencia dependiendo del lugar de dnde se surte la receta y alguna farmacias pueden ofrecer precios ms baratos.  El sitio web www.goodrx.com tiene cupones para medicamentos de diferentes farmacias. Los precios aqu no tienen en cuenta lo que podra costar con la ayuda del seguro (puede ser ms barato con su seguro), pero el sitio web puede darle el  precio si no utiliz ningn seguro.  - Puede imprimir el cupn correspondiente y llevarlo con su receta a la farmacia.  - Tambin puede pasar por nuestra oficina durante el horario de atencin regular y recoger una tarjeta de cupones de GoodRx.  - Si necesita que su receta se enve electrnicamente a una farmacia diferente, informe a nuestra oficina a travs de MyChart de [place] o por telfono llamando al [phone] y presione la opcin 4.  

## 2022-08-14 ENCOUNTER — Encounter: Payer: Self-pay | Admitting: Dermatology

## 2022-08-17 ENCOUNTER — Ambulatory Visit: Payer: PPO | Admitting: Anesthesiology

## 2022-08-17 ENCOUNTER — Ambulatory Visit
Admission: RE | Admit: 2022-08-17 | Discharge: 2022-08-17 | Disposition: A | Discharge status: Home / Self Care | Payer: PPO | Attending: Ophthalmology | Admitting: Ophthalmology

## 2022-08-17 ENCOUNTER — Encounter: Payer: Self-pay | Admitting: Ophthalmology

## 2022-08-17 ENCOUNTER — Encounter
Admission: RE | Disposition: A | Discharge status: Home / Self Care | Payer: Self-pay | Source: Home / Self Care | Attending: Ophthalmology

## 2022-08-17 ENCOUNTER — Other Ambulatory Visit: Payer: Self-pay

## 2022-08-17 DIAGNOSIS — F17210 Nicotine dependence, cigarettes, uncomplicated: Secondary | ICD-10-CM | POA: Insufficient documentation

## 2022-08-17 DIAGNOSIS — K219 Gastro-esophageal reflux disease without esophagitis: Secondary | ICD-10-CM | POA: Diagnosis not present

## 2022-08-17 DIAGNOSIS — G4730 Sleep apnea, unspecified: Secondary | ICD-10-CM | POA: Insufficient documentation

## 2022-08-17 DIAGNOSIS — I10 Essential (primary) hypertension: Secondary | ICD-10-CM | POA: Diagnosis not present

## 2022-08-17 DIAGNOSIS — H2512 Age-related nuclear cataract, left eye: Secondary | ICD-10-CM | POA: Diagnosis not present

## 2022-08-17 DIAGNOSIS — H2511 Age-related nuclear cataract, right eye: Secondary | ICD-10-CM | POA: Insufficient documentation

## 2022-08-17 HISTORY — PX: CATARACT EXTRACTION W/PHACO: SHX586

## 2022-08-17 SURGERY — PHACOEMULSIFICATION, CATARACT, WITH IOL INSERTION
Anesthesia: Monitor Anesthesia Care | Site: Eye | Laterality: Right

## 2022-08-17 MED ORDER — ARMC OPHTHALMIC DILATING DROPS
1.0000 | OPHTHALMIC | Status: DC | PRN
  Administered 2022-08-17 (×3): 1 via OPHTHALMIC

## 2022-08-17 MED ORDER — SIGHTPATH DOSE#1 BSS IO SOLN
INTRAOCULAR | Status: DC | PRN
  Administered 2022-08-17: 1 mL

## 2022-08-17 MED ORDER — MIDAZOLAM HCL 2 MG/2ML IJ SOLN
INTRAMUSCULAR | Status: DC | PRN
  Administered 2022-08-17: 1 mg via INTRAVENOUS

## 2022-08-17 MED ORDER — SIGHTPATH DOSE#1 BSS IO SOLN
INTRAOCULAR | Status: DC | PRN
  Administered 2022-08-17: 15 mL

## 2022-08-17 MED ORDER — MOXIFLOXACIN HCL 0.5 % OP SOLN
OPHTHALMIC | Status: DC | PRN
  Administered 2022-08-17: .2 mL via OPHTHALMIC

## 2022-08-17 MED ORDER — SIGHTPATH DOSE#1 BSS IO SOLN
INTRAOCULAR | Status: DC | PRN
  Administered 2022-08-17: 56 mL via OPHTHALMIC

## 2022-08-17 MED ORDER — TETRACAINE HCL 0.5 % OP SOLN
1.0000 [drp] | OPHTHALMIC | Status: DC | PRN
  Administered 2022-08-17 (×3): 1 [drp] via OPHTHALMIC

## 2022-08-17 MED ORDER — FENTANYL CITRATE (PF) 100 MCG/2ML IJ SOLN
INTRAMUSCULAR | Status: DC | PRN
  Administered 2022-08-17: 50 ug via INTRAVENOUS

## 2022-08-17 MED ORDER — SIGHTPATH DOSE#1 NA CHONDROIT SULF-NA HYALURON 40-17 MG/ML IO SOLN
INTRAOCULAR | Status: DC | PRN
  Administered 2022-08-17: 1 mL via INTRAOCULAR

## 2022-08-17 MED ORDER — BRIMONIDINE TARTRATE-TIMOLOL 0.2-0.5 % OP SOLN
OPHTHALMIC | Status: DC | PRN
  Administered 2022-08-17: 1 [drp] via OPHTHALMIC

## 2022-08-17 SURGICAL SUPPLY — 18 items
ANGLE REVERSE CUT SHRT 25GA (CUTTER) ×1
CANNULA ANT/CHMB 27G (MISCELLANEOUS) IMPLANT
CANNULA ANT/CHMB 27GA (MISCELLANEOUS) IMPLANT
CATARACT SUITE SIGHTPATH (MISCELLANEOUS) ×1 IMPLANT
CYSTOTOME ANGL RVRS SHRT 25G (CUTTER) ×1 IMPLANT
CYSTOTOME ANGL RVRS SHRT 25GA (CUTTER) ×1 IMPLANT
FEE CATARACT SUITE SIGHTPATH (MISCELLANEOUS) ×1 IMPLANT
GLOVE BIOGEL PI IND STRL 8 (GLOVE) ×1 IMPLANT
GLOVE SURG ENC TEXT LTX SZ8 (GLOVE) ×1 IMPLANT
LENS CLAREON PANOPTIX TORIC 20 ×1 IMPLANT
LENS IOL CLRN PAN 5 20.0 IMPLANT
NDL FILTER BLUNT 18X1 1/2 (NEEDLE) ×1 IMPLANT
NEEDLE FILTER BLUNT 18X1 1/2 (NEEDLE) ×1 IMPLANT
PACK VIT ANT 23G (MISCELLANEOUS) IMPLANT
RING MALYGIN (MISCELLANEOUS) IMPLANT
SUT ETHILON 10-0 CS-B-6CS-B-6 (SUTURE)
SUTURE EHLN 10-0 CS-B-6CS-B-6 (SUTURE) IMPLANT
SYR 3ML LL SCALE MARK (SYRINGE) ×1 IMPLANT

## 2022-08-17 NOTE — H&P (Signed)
[facility]   Primary Care Physician:  Carren Rang, PA-C Ophthalmologist: Dr. Druscilla Brownie  Pre-Procedure History & Physical: HPI:  Jason Washington is a 77 y.o. male here for cataract surgery.   Past Medical History:  Diagnosis Date   Actinic keratosis    Basal cell carcinoma 11/21/2019   right superior lat pectoral/ EDC   Chronic GERD    Genital herpes    Hepatic steatosis    Hypertension    Imbalance    Melanoma (HCC)    R knee several years ago    Mild cognitive impairment with memory loss    Neuropathy    Prostate CA (HCC)    Sleep apnea    Squamous cell carcinoma of skin 12/22/2021   left forearm-anterior treated wiht [facility]   Thoracic aortic aneurysm [facility])     Past Surgical History:  Procedure Laterality Date   APPENDECTOMY     CATARACT EXTRACTION W/PHACO Left 08/03/2022   Procedure: CATARACT EXTRACTION PHACO AND INTRAOCULAR LENS PLACEMENT (IOC) LEFT CLAREON PANOPTIX TORIC  9.11  00:47.4;  Surgeon: Galen Manila, MD;  Location: [facility];  Service: Ophthalmology;  Laterality: Left;   COLONOSCOPY WITH PROPOFOL N/A 10/07/2021   Procedure: COLONOSCOPY WITH PROPOFOL;  Surgeon: Toledo, Boykin Nearing, MD;  Location: [facility] ENDOSCOPY;  Service: Endoscopy;  Laterality: N/A;   HERNIA REPAIR     PROSTATE SURGERY     TONSILLECTOMY      Prior to Admission medications   Medication Sig Start Date End Date Taking? Authorizing Provider  acyclovir (ZOVIRAX) 400 MG tablet Take 400 mg by mouth 2 (two) times daily. 01/04/19  Yes [provider]  amLODipine (NORVASC) 5 MG tablet amlodipine 5 mg tablet 09/28/13  Yes [provider]  aspirin EC 81 MG tablet Take 81 mg by mouth daily. Swallow whole.   Yes [provider]  atorvastatin (LIPITOR) 40 MG tablet atorvastatin 40 mg tablet 04/13/18  Yes [provider]  budesonide (ENTOCORT EC) 3 MG 24 hr capsule Take 9 mg by mouth daily.   Yes [provider]  cetirizine (ZYRTEC) 10  MG tablet Take 10 mg by mouth daily. Barkley & Barbette Merino   Yes [provider]  donepezil (ARICEPT) 10 MG tablet Take 10 mg by mouth at bedtime.   Yes [provider]  hydrochlorothiazide (HYDRODIURIL) 25 MG tablet hydrochlorothiazide 25 mg tablet 09/13/13  Yes [provider]  ketoconazole (NIZORAL) 2 % cream Apply 1 Application topically at bedtime. Qhs to feet 01/13/22  Yes Deirdre Evener, MD  mupirocin ointment (BACTROBAN) 2 % Apply 1 Application topically daily. 02/22/22  Yes Deirdre Evener, MD  omeprazole (PRILOSEC) 40 MG capsule omeprazole 40 mg capsule,delayed release 09/04/13  Yes [provider]  valsartan (DIOVAN) 80 MG tablet Take 80 mg by mouth daily.   Yes [provider]    Allergies as of 07/29/2022   (No Known Allergies)    History reviewed. No pertinent family history.  Social History   Socioeconomic History   Marital status: Married    Spouse name: Not on file   Number of children: Not on file   Years of education: Not on file   Highest education level: Not on file  Occupational History   Not on file  Tobacco Use   Smoking status: Every Day    Packs/day: 1.00    Years: 10.00    Additional pack years: 0.00    Total pack years: 10.00    Types:  Cigarettes   Smokeless tobacco: Never   Tobacco comments:    2 cigs daily- 10/23/2019  Vaping Use   Vaping Use: Never used  Substance and Sexual Activity   Alcohol use: Yes    Comment: rare   Drug use: Never   Sexual activity: Not on file  Other Topics Concern   Not on file  Social History Narrative   Not on file   Social Determinants of Health   Financial Resource Strain: Not on file  Food Insecurity: Not on file  Transportation Needs: Not on file  Physical Activity: Not on file  Stress: Not on file  Social Connections: Not on file  Intimate Partner Violence: Not on file    Review of Systems: See HPI, otherwise negative ROS  Physical Exam: BP (!) 161/78    Pulse 67   Temp (!) 97.2 F (36.2 C) (Temporal)   Resp 18   Ht 5\' 11"  (1.803 m)   Wt 115.2 kg   SpO2 94%   BMI 35.41 kg/m  General:   Alert, cooperative in NAD Head:  Normocephalic and atraumatic. Respiratory:  Normal work of breathing. Cardiovascular:  RRR  Impression/Plan: Jason Washington is here for cataract surgery.  Risks, benefits, limitations, and alternatives regarding cataract surgery have been reviewed with the patient.  Questions have been answered.  All parties agreeable.   Galen Manila, MD  08/17/2022, 11:08 AM

## 2022-08-17 NOTE — Transfer of Care (Signed)
Immediate Anesthesia Transfer of Care Note  Patient: Jason Washington  Procedure(s) Performed: CATARACT EXTRACTION PHACO AND INTRAOCULAR LENS PLACEMENT (IOC) RIGHT CLAREON PANOPTIX TORIC (Right: Eye)  Patient Location: PACU  Anesthesia Type: MAC  Level of Consciousness: awake, alert  and patient cooperative  Airway and Oxygen Therapy: Patient Spontanous Breathing and Patient connected to supplemental oxygen  Post-op Assessment: Post-op Vital signs reviewed, Patient's Cardiovascular Status Stable, Respiratory Function Stable, Patent Airway and No signs of Nausea or vomiting  Post-op Vital Signs: Reviewed and stable  Complications: No notable events documented.

## 2022-08-17 NOTE — Anesthesia Postprocedure Evaluation (Signed)
Anesthesia Post Note  Patient: Jason Washington  Procedure(s) Performed: CATARACT EXTRACTION PHACO AND INTRAOCULAR LENS PLACEMENT (IOC) RIGHT CLAREON PANOPTIX TORIC (Right: Eye)  Patient location during evaluation: PACU Anesthesia Type: MAC Level of consciousness: awake and alert Pain management: pain level controlled Vital Signs Assessment: post-procedure vital signs reviewed and stable Respiratory status: spontaneous breathing, nonlabored ventilation, respiratory function stable and patient connected to nasal cannula oxygen Cardiovascular status: stable and blood pressure returned to baseline Postop Assessment: no apparent nausea or vomiting Anesthetic complications: no   No notable events documented.   Last Vitals:  Vitals:   08/17/22 1135 08/17/22 1140  BP: 131/84 (!) 153/83  Pulse: 65   Resp: 20   Temp: (!) 36.4 C (!) 36.4 C  SpO2: 95%     Last Pain:  Vitals:   08/17/22 1140  TempSrc:   PainSc: 0-No pain                 Alyviah Crandle C Rawlin Reaume

## 2022-08-17 NOTE — Op Note (Signed)
PREOPERATIVE DIAGNOSIS:  Nuclear sclerotic cataract of the right eye.   POSTOPERATIVE DIAGNOSIS:  Nuclear sclerotic cataract of the right eye.   OPERATIVE PROCEDURE: Procedure(s): CATARACT EXTRACTION PHACO AND INTRAOCULAR LENS PLACEMENT (IOC) RIGHT CLAREON PANOPTIX TORIC   SURGEON:  Galen Manila, MD.   ANESTHESIA: 1.      Managed anesthesia care. 2.     0.90ml of Shugarcaine was instilled following the paracentesis  Anesthesiologist: Marisue Humble, MD CRNA: Barbette Hair, CRNA  COMPLICATIONS:  None.   TECHNIQUE:   Stop and chop    DESCRIPTION OF PROCEDURE:  The patient was examined and consented in the preoperative holding area where the aforementioned topical anesthesia was applied to the right eye.  The patient was brought back to the Operating Room where he was sat upright on the gurney and given a target to fixate upon while the eye was marked at the 3:00 and 9:00 position.  The patient was then reclined on the operating table.  The eye was prepped and draped in the usual sterile ophthalmic fashion and a lid speculum was placed. A paracentesis was created with the side port blade and the anterior chamber was filled with viscoelastic. A near clear corneal incision was performed with the steel keratome. A continuous curvilinear capsulorrhexis was performed with a cystotome followed by the capsulorrhexis forceps. Hydrodissection and hydrodelineation were carried out with BSS on a blunt cannula. The lens was removed in a stop and chop technique and the remaining cortical material was removed with the irrigation-aspiration handpiece. The eye was inflated with viscoelastic and the [id number]  lens  was placed in the eye and rotated to within a few degrees of the predetermined orientation.  The remaining viscoelastic was removed from the eye.  The Sinskey hook was used to rotate the toric lens into its final resting place at 015 degrees.  0. The eye was inflated to a physiologic pressure and found to  be watertight. 0.82ml of Vigamox was placed in the anterior chamber.  The eye was dressed with Vigamox. And Combigan.The patient was given protective glasses to wear throughout the day and a shield with which to sleep tonight. The patient was also given drops with which to begin a drop regimen today and will follow-up with me in one day. Implant Name Type Inv. Item Serial No. Manufacturer Lot No. LRB No. Used Action  LENS CLAREON PANOPTIX TORIC 20 - [id number]  LENS CLAREON PANOPTIX TORIC 20 [id number] [employer]  Right 1 Implanted   Procedure(s) with comments: CATARACT EXTRACTION PHACO AND INTRAOCULAR LENS PLACEMENT (IOC) RIGHT CLAREON PANOPTIX TORIC (Right) - 14.56 1:18.0  Electronically signed: Galen Manila 08/17/2022 11:35 AM

## 2022-08-18 ENCOUNTER — Encounter: Payer: Self-pay | Admitting: Ophthalmology

## 2022-08-26 ENCOUNTER — Telehealth: Payer: Self-pay

## 2022-08-26 DIAGNOSIS — C44310 Basal cell carcinoma of skin of unspecified parts of face: Secondary | ICD-10-CM

## 2022-08-26 NOTE — Telephone Encounter (Signed)
Discussed pathology results with patient and his wife. Discussed Mohs surgery. Referral placed for Dr. Lorn Junes. Patient and wife voiced understanding.

## 2022-08-26 NOTE — Telephone Encounter (Signed)
-----   Message from Armida Sans sent at 08/24/2022  1:36 PM EDT ----- Diagnosis 1. Skin , right forearm SEBORRHEIC KERATOSIS, IRRITATED 2. Skin , right sideburn BASAL CELL CARCINOMA, NODULAR AND INFILTRATIVE PATTERNS 3. Skin , right tricep HYPERTROPHIC ACTINIC KERATOSIS WITH FEATURES OF A VERRUCA  1- benign irritated keratosis No further treatment needed 2- Cancer = BCC Schedule for MOHS 3- PreCancer Recheck next visit - will treat (LN2) if persistent

## 2022-09-16 ENCOUNTER — Other Ambulatory Visit (INDEPENDENT_AMBULATORY_CARE_PROVIDER_SITE_OTHER): Payer: Self-pay

## 2022-09-16 ENCOUNTER — Encounter (INDEPENDENT_AMBULATORY_CARE_PROVIDER_SITE_OTHER): Payer: Self-pay | Admitting: Vascular Surgery

## 2022-10-27 ENCOUNTER — Telehealth: Payer: Self-pay

## 2022-10-27 ENCOUNTER — Ambulatory Visit (INDEPENDENT_AMBULATORY_CARE_PROVIDER_SITE_OTHER): Payer: PPO | Admitting: Dermatology

## 2022-10-27 DIAGNOSIS — L570 Actinic keratosis: Secondary | ICD-10-CM | POA: Diagnosis not present

## 2022-10-27 MED ORDER — AMINOLEVULINIC ACID HCL 10 % EX GEL
2000.0000 mg | Freq: Once | CUTANEOUS | Status: AC
Start: 2022-10-27 — End: 2022-10-27
  Administered 2022-10-27: 2000 mg via TOPICAL

## 2022-10-27 NOTE — Patient Instructions (Signed)
Levulan/PDT Treatment Common Side Effects  - Burning/stinging, which may be severe and last up to 24-72 hours after your treatment  - Redness, swelling and/or peeling which may last up to 4 weeks  - Scaling/crusting which may last up to 2 weeks  - Sun sensitivity (you MUST avoid sun exposure for 48-72 hours after treatment)  Care Instructions  - Okay to wash with soap and water and shampoo as normal  - If needed, you can do a cold compress (ex. Ice packs) for comfort  - If okay with your Primary Doctor, you may use analgesics such as Tylenol every 4-6 hours, not to exceed recommended dose  - You may apply Cerave Healing Ointment, Vaseline or Aquaphor  - If you have a lot of swelling you may take a Benadryl to help with this (this may cause drowsiness)  Sun Precautions  - Wear a wide brim hat for the next week if outside  - Wear a sunblock with zinc or titanium dioxide at least SPF 50 daily   We will recheck you in 10-12 weeks. If any problems, please call the office and ask to speak with a nurse. 

## 2022-10-27 NOTE — Telephone Encounter (Signed)
This is the patient that's here for scalp PDT today. Looks like he was supposed to have MOHS surgery for a BCC at his right sideburn.

## 2022-10-27 NOTE — Progress Notes (Signed)
Patient completed red light phototherapy with debridement today.  ACTINIC KERATOSES Exam: Erythematous thin papules/macules with gritty scale.  Treatment Plan:  Red Light Photodynamic therapy  Procedure discussed: discussed risks, benefits, side effects. and alternatives   Prep: site scrubbed/prepped with acetone   Debridement needed: Yes (performed by Physician with sand paper.  (CPT C5184948)) Location:  [facility] Number of lesions:  Multiple (> 15) Type of treatment:  Red light Aminolevulinic Acid (see MAR for details): Ameluz Aminolevulinic Acid comment:  [id number] Amount of Ameluz (mg):  1 Incubation time (minutes):  120 Number of minutes under lamp:  10 Cooling:  Fan Outcome: patient tolerated procedure well with no complications   Post-procedure details: sunscreen applied and aftercare instructions given to patient    Related Medications Aminolevulinic Acid HCl 10 % GEL 2,000 mg  Karolee Stamps, personally debrided area prior to application of aminolevulinic acid  Documentation: I have reviewed the above documentation for accuracy and completeness, and I agree with the above.  Armida Sans, MD

## 2022-10-27 NOTE — Telephone Encounter (Signed)
Patient stated that he would call [facility] back this week to reschedule his [facility] procedure. Patient advised that if they needed a new referral to let us know and we would be happy to resend. Patient also scheduled to come back in for a followup to see Dr. Gwen Pounds in 2 months.

## 2022-10-30 ENCOUNTER — Encounter: Payer: Self-pay | Admitting: Dermatology

## 2022-11-10 DIAGNOSIS — E782 Mixed hyperlipidemia: Secondary | ICD-10-CM | POA: Diagnosis not present

## 2022-11-10 DIAGNOSIS — E119 Type 2 diabetes mellitus without complications: Secondary | ICD-10-CM | POA: Diagnosis not present

## 2022-11-10 DIAGNOSIS — I10 Essential (primary) hypertension: Secondary | ICD-10-CM | POA: Diagnosis not present

## 2022-11-17 DIAGNOSIS — R911 Solitary pulmonary nodule: Secondary | ICD-10-CM | POA: Diagnosis not present

## 2022-11-17 DIAGNOSIS — I10 Essential (primary) hypertension: Secondary | ICD-10-CM | POA: Diagnosis not present

## 2022-11-17 DIAGNOSIS — J449 Chronic obstructive pulmonary disease, unspecified: Secondary | ICD-10-CM | POA: Diagnosis not present

## 2022-11-17 DIAGNOSIS — Z87891 Personal history of nicotine dependence: Secondary | ICD-10-CM | POA: Diagnosis not present

## 2022-11-17 DIAGNOSIS — M51369 Other intervertebral disc degeneration, lumbar region without mention of lumbar back pain or lower extremity pain: Secondary | ICD-10-CM | POA: Diagnosis not present

## 2022-11-17 DIAGNOSIS — G629 Polyneuropathy, unspecified: Secondary | ICD-10-CM | POA: Diagnosis not present

## 2022-11-17 DIAGNOSIS — A6000 Herpesviral infection of urogenital system, unspecified: Secondary | ICD-10-CM | POA: Diagnosis not present

## 2022-11-17 DIAGNOSIS — E782 Mixed hyperlipidemia: Secondary | ICD-10-CM | POA: Diagnosis not present

## 2022-11-17 DIAGNOSIS — K219 Gastro-esophageal reflux disease without esophagitis: Secondary | ICD-10-CM | POA: Diagnosis not present

## 2022-11-17 DIAGNOSIS — M47816 Spondylosis without myelopathy or radiculopathy, lumbar region: Secondary | ICD-10-CM | POA: Diagnosis not present

## 2022-11-17 DIAGNOSIS — C61 Malignant neoplasm of prostate: Secondary | ICD-10-CM | POA: Diagnosis not present

## 2022-11-17 DIAGNOSIS — E119 Type 2 diabetes mellitus without complications: Secondary | ICD-10-CM | POA: Diagnosis not present

## 2022-12-21 ENCOUNTER — Ambulatory Visit: Payer: PPO | Admitting: Dermatology

## 2022-12-21 ENCOUNTER — Encounter: Payer: Self-pay | Admitting: Dermatology

## 2022-12-21 DIAGNOSIS — L570 Actinic keratosis: Secondary | ICD-10-CM

## 2022-12-21 DIAGNOSIS — Z5111 Encounter for antineoplastic chemotherapy: Secondary | ICD-10-CM | POA: Diagnosis not present

## 2022-12-21 DIAGNOSIS — L820 Inflamed seborrheic keratosis: Secondary | ICD-10-CM

## 2022-12-21 DIAGNOSIS — W908XXA Exposure to other nonionizing radiation, initial encounter: Secondary | ICD-10-CM

## 2022-12-21 DIAGNOSIS — L578 Other skin changes due to chronic exposure to nonionizing radiation: Secondary | ICD-10-CM | POA: Diagnosis not present

## 2022-12-21 DIAGNOSIS — Z7189 Other specified counseling: Secondary | ICD-10-CM

## 2022-12-21 DIAGNOSIS — Z79899 Other long term (current) drug therapy: Secondary | ICD-10-CM

## 2022-12-21 NOTE — Patient Instructions (Addendum)
Instructions for [facility] Medications  One or more of your medications was sent to the [facility] mail order compounding pharmacy. You will receive an email from them and can purchase the medicine through that link. It will then be mailed to your home at the address you confirmed. If for any reason you do not receive an email from them, please check your spam folder. If you still do not find the email, please let us know. [facility] phone number is [phone].  Starting in 4 weeks - Apply 5-fluorouracil/calcipotriene cream twice a day for 10 days to affected areas including scalp. Prescription sent to [facility]. Patient advised they will receive an email to purchase the medication online and have it sent to their home. Patient provided with handout reviewing treatment course and side effects and advised to call or message [place] on MyChart with any concerns.   Cryotherapy Aftercare  Wash gently with soap and water everyday.   Apply Vaseline and Band-Aid daily until healed.   Due to recent changes in healthcare laws, you may see results of your pathology and/or laboratory studies on MyChart before the doctors have had a chance to review them. We understand that in some cases there may be results that are confusing or concerning to you. Please understand that not all results are received at the same time and often the doctors may need to interpret multiple results in order to provide you with the best plan of care or course of treatment. Therefore, we ask that you please give [place] 2 business days to thoroughly review all your results before contacting the office for clarification. Should we see a critical lab result, you will be contacted sooner.   If You Need Anything After Your Visit  If you have any questions or concerns for your doctor, please call our main line at [phone] and press option 4 to reach your doctor's medical assistant. If no one answers,  please leave a voicemail as directed and we will return your call as soon as possible. Messages left after 4 pm will be answered the following business day.   You may also send [place] a message via MyChart. We typically respond to MyChart messages within 1-2 business days.  For prescription refills, please ask your pharmacy to contact our office. Our fax number is [phone].  If you have an urgent issue when the clinic is closed that cannot wait until the next business day, you can page your doctor at the number below.    Please note that while we do our best to be available for urgent issues outside of office hours, we are not available 24/7.   If you have an urgent issue and are unable to reach [place], you may choose to seek medical care at your doctor's office, retail clinic, urgent care center, or emergency room.  If you have a medical emergency, please immediately call 911 or go to the emergency department.  Pager Numbers  - Dr. Gwen Pounds: [phone]  - Dr. Roseanne Reno: [phone]  - Dr. Katrinka Blazing: [phone]   In the event of inclement weather, please call our main line at [phone] for an update on the status of any delays or closures.  Dermatology Medication Tips: Please keep the boxes that topical medications come in in order to help keep track of the instructions about where and how to use these. Pharmacies typically print the medication instructions only on the boxes and not directly on the medication tubes.   If  your medication is too expensive, please contact our office at [phone] option 4 or send [place] a message through MyChart.   We are unable to tell what your co-pay for medications will be in advance as this is different depending on your insurance coverage. However, we may be able to find a substitute medication at lower cost or fill out paperwork to get insurance to cover a needed medication.   If a prior authorization is required to get your medication covered by your  insurance company, please allow [place] 1-2 business days to complete this process.  Drug prices often vary depending on where the prescription is filled and some pharmacies may offer cheaper prices.  The website www.goodrx.com contains coupons for medications through different pharmacies. The prices here do not account for what the cost may be with help from insurance (it may be cheaper with your insurance), but the website can give you the price if you did not use any insurance.  - You can print the associated coupon and take it with your prescription to the pharmacy.  - You may also stop by our office during regular business hours and pick up a GoodRx coupon card.  - If you need your prescription sent electronically to a different pharmacy, notify our office through [facility] or by phone at [phone] option 4.

## 2022-12-21 NOTE — Progress Notes (Signed)
Follow-Up Visit   Subjective  Jason Washington is a 77 y.o. male who presents for the following: AK follow up. Patient had PDT in September. There is a spot at patient's scalp that he would like checked.  The patient has spots, moles and lesions to be evaluated, some may be new or changing and the patient may have concern these could be cancer.   The following portions of the chart were reviewed this encounter and updated as appropriate: medications, allergies, medical history  Review of Systems:  No other skin or systemic complaints except as noted in HPI or Assessment and Plan.  Objective  Well appearing patient in no apparent distress; mood and affect are within normal limits.   A focused examination was performed of the following areas: Face, scalp  Relevant exam findings are noted in the Assessment and Plan.  Scalp, forehead and nose (18) Erythematous thin papules/macules with gritty scale.   Scalp (16) Erythematous stuck-on, waxy papule or plaque    Assessment & Plan   ACTINIC DAMAGE WITH PRECANCEROUS ACTINIC KERATOSES Counseling for Topical Chemotherapy Management: Patient exhibits: - Severe, confluent actinic changes with pre-cancerous actinic keratoses that is secondary to cumulative UV radiation exposure over time - Condition that is severe; chronic, not at goal. - diffuse scaly erythematous macules and papules with underlying dyspigmentation - Discussed Prescription "Field Treatment" topical Chemotherapy for Severe, Chronic Confluent Actinic Changes with Pre-Cancerous Actinic Keratoses Field treatment involves treatment of an entire area of skin that has confluent Actinic Changes (Sun/ Ultraviolet light damage) and PreCancerous Actinic Keratoses by method of PhotoDynamic Therapy (PDT) and/or prescription Topical Chemotherapy agents such as 5-fluorouracil, 5-fluorouracil/calcipotriene, and/or imiquimod.  The purpose is to decrease the number of clinically  evident and subclinical PreCancerous lesions to prevent progression to development of skin cancer by chemically destroying early precancer changes that may or may not be visible.  It has been shown to reduce the risk of developing skin cancer in the treated area. As a result of treatment, redness, scaling, crusting, and open sores may occur during treatment course. One or more than one of these methods may be used and may have to be used several times to control, suppress and eliminate the PreCancerous changes. Discussed treatment course, expected reaction, and possible side effects. - Recommend daily broad spectrum sunscreen SPF 30+ to sun-exposed areas, reapply every 2 hours as needed.  - Staying in the shade or wearing long sleeves, sun glasses (UVA+UVB protection) and wide brim hats (4-inch brim around the entire circumference of the hat) are also recommended. - Call for new or changing lesions. - Apply 5-fluorouracil/calcipotriene cream twice a day for 10 days to affected areas including scalp. Prescription sent to [facility]. Patient advised they will receive an email to purchase the medication online and have it sent to their home. Patient provided with handout reviewing treatment course and side effects and advised to call or message [place] on MyChart with any concerns.   AK (actinic keratosis) (18) Scalp, forehead and nose  Actinic keratoses are precancerous spots that appear secondary to cumulative UV radiation exposure/sun exposure over time. They are chronic with expected duration over 1 year. A portion of actinic keratoses will progress to squamous cell carcinoma of the skin. It is not possible to reliably predict which spots will progress to skin cancer and so treatment is recommended to prevent development of skin cancer.  Recommend daily broad spectrum sunscreen SPF 30+ to sun-exposed areas, reapply every 2 hours  as needed.  Recommend staying in the shade or wearing  long sleeves, sun glasses (UVA+UVB protection) and wide brim hats (4-inch brim around the entire circumference of the hat). Call for new or changing lesions.   Destruction of lesion - Scalp, forehead and nose (18)  Destruction method: cryotherapy   Informed consent: discussed and consent obtained   Lesion destroyed using liquid nitrogen: Yes   Cryotherapy cycles:  2 Outcome: patient tolerated procedure well with no complications   Post-procedure details: wound care instructions given    Inflamed seborrheic keratosis (16) Scalp  Destruction of lesion - Scalp (16)  Destruction method: cryotherapy   Informed consent: discussed and consent obtained   Lesion destroyed using liquid nitrogen: Yes   Cryotherapy cycles:  2 Outcome: patient tolerated procedure well with no complications   Post-procedure details: wound care instructions given      Return in about 8 months (around 08/20/2023) for AK follow up.  Anise Salvo, RMA, am acting as scribe for Armida Sans, MD .   Documentation: I have reviewed the above documentation for accuracy and completeness, and I agree with the above.  Armida Sans, MD

## 2023-03-18 DIAGNOSIS — H04223 Epiphora due to insufficient drainage, bilateral lacrimal glands: Secondary | ICD-10-CM | POA: Diagnosis not present

## 2023-03-18 DIAGNOSIS — H35362 Drusen (degenerative) of macula, left eye: Secondary | ICD-10-CM | POA: Diagnosis not present

## 2023-05-24 ENCOUNTER — Emergency Department

## 2023-05-24 ENCOUNTER — Emergency Department
Admission: EM | Admit: 2023-05-24 | Discharge: 2023-05-24 | Disposition: A | Discharge status: Home / Self Care | Attending: Emergency Medicine | Admitting: Emergency Medicine

## 2023-05-24 DIAGNOSIS — E876 Hypokalemia: Secondary | ICD-10-CM | POA: Insufficient documentation

## 2023-05-24 DIAGNOSIS — G930 Cerebral cysts: Secondary | ICD-10-CM | POA: Diagnosis not present

## 2023-05-24 DIAGNOSIS — R29898 Other symptoms and signs involving the musculoskeletal system: Secondary | ICD-10-CM | POA: Diagnosis not present

## 2023-05-24 DIAGNOSIS — R0602 Shortness of breath: Secondary | ICD-10-CM | POA: Diagnosis not present

## 2023-05-24 DIAGNOSIS — D329 Benign neoplasm of meninges, unspecified: Secondary | ICD-10-CM | POA: Diagnosis not present

## 2023-05-24 DIAGNOSIS — G936 Cerebral edema: Secondary | ICD-10-CM | POA: Diagnosis not present

## 2023-05-24 DIAGNOSIS — R519 Headache, unspecified: Secondary | ICD-10-CM | POA: Diagnosis not present

## 2023-05-24 DIAGNOSIS — I10 Essential (primary) hypertension: Secondary | ICD-10-CM | POA: Diagnosis not present

## 2023-05-24 DIAGNOSIS — I517 Cardiomegaly: Secondary | ICD-10-CM | POA: Diagnosis not present

## 2023-05-24 DIAGNOSIS — R220 Localized swelling, mass and lump, head: Secondary | ICD-10-CM | POA: Diagnosis not present

## 2023-05-24 LAB — CBC WITH DIFFERENTIAL/PLATELET
Abs Immature Granulocytes: 0.06 10*3/uL (ref 0.00–0.07)
Basophils Absolute: 0.1 10*3/uL (ref 0.0–0.1)
Basophils Relative: 1 %
Eosinophils Absolute: 0 10*3/uL (ref 0.0–0.5)
Eosinophils Relative: 0 %
HCT: 43.4 % (ref 39.0–52.0)
Hemoglobin: 14.8 g/dL (ref 13.0–17.0)
Immature Granulocytes: 1 %
Lymphocytes Relative: 26 %
Lymphs Abs: 2.7 10*3/uL (ref 0.7–4.0)
MCH: 33.8 pg (ref 26.0–34.0)
MCHC: 34.1 g/dL (ref 30.0–36.0)
MCV: 99.1 fL (ref 80.0–100.0)
Monocytes Absolute: 0.9 10*3/uL (ref 0.1–1.0)
Monocytes Relative: 9 %
Neutro Abs: 6.6 10*3/uL (ref 1.7–7.7)
Neutrophils Relative %: 63 %
Platelets: 274 10*3/uL (ref 150–400)
RBC: 4.38 MIL/uL (ref 4.22–5.81)
RDW: 13 % (ref 11.5–15.5)
WBC: 10.4 10*3/uL (ref 4.0–10.5)
nRBC: 0 % (ref 0.0–0.2)

## 2023-05-24 LAB — TROPONIN I (HIGH SENSITIVITY)
Troponin I (High Sensitivity): 38 ng/L — ABNORMAL HIGH (ref <18)
Troponin I (High Sensitivity): 36 ng/L — ABNORMAL HIGH (ref <18)

## 2023-05-24 LAB — COMPREHENSIVE METABOLIC PANEL WITH GFR
ALT: 25 U/L (ref 0–44)
AST: 26 U/L (ref 15–41)
Albumin: 4.1 g/dL (ref 3.5–5.0)
Alkaline Phosphatase: 56 U/L (ref 38–126)
Anion gap: 11 (ref 5–15)
BUN: 11 mg/dL (ref 8–23)
CO2: 26 mmol/L (ref 22–32)
Calcium: 9.3 mg/dL (ref 8.9–10.3)
Chloride: 103 mmol/L (ref 98–111)
Creatinine, Ser: 0.74 mg/dL (ref 0.61–1.24)
GFR, Estimated: >60 mL/min (ref >60)
Glucose, Bld: 107 mg/dL — ABNORMAL HIGH (ref 70–99)
Potassium: 3.1 mmol/L — ABNORMAL LOW (ref 3.5–5.1)
Sodium: 140 mmol/L (ref 135–145)
Total Bilirubin: 1 mg/dL (ref 0.0–1.2)
Total Protein: 7.5 g/dL (ref 6.5–8.1)

## 2023-05-24 MED ORDER — POTASSIUM CHLORIDE CRYS ER 20 MEQ PO TBCR
40.0000 meq | EXTENDED_RELEASE_TABLET | Freq: Once | ORAL | Status: AC
  Administered 2023-05-24: 40 meq via ORAL
  Filled 2023-05-24: qty 2

## 2023-05-24 MED ORDER — AMLODIPINE BESYLATE 10 MG PO TABS: 10.0000 mg | ORAL_TABLET | Freq: Every day | ORAL | 0 refills | Status: AC

## 2023-05-24 MED ORDER — DEXAMETHASONE 0.5 MG PO TABS: 0.5000 mg | ORAL_TABLET | Freq: Two times a day (BID) | ORAL | 0 refills | Status: AC

## 2023-05-24 MED ORDER — GADOBUTROL 1 MMOL/ML IV SOLN
10.0000 mL | Freq: Once | INTRAVENOUS | Status: AC | PRN
  Administered 2023-05-24: 10 mL via INTRAVENOUS

## 2023-05-24 MED ORDER — HYDRALAZINE HCL 20 MG/ML IJ SOLN
10.0000 mg | Freq: Once | INTRAMUSCULAR | Status: AC
  Administered 2023-05-24: 10 mg via INTRAVENOUS
  Filled 2023-05-24: qty 1

## 2023-05-24 MED ORDER — AMLODIPINE BESYLATE 5 MG PO TABS
5.0000 mg | ORAL_TABLET | Freq: Once | ORAL | Status: AC
  Administered 2023-05-24: 5 mg via ORAL
  Filled 2023-05-24: qty 1

## 2023-05-24 NOTE — ED Triage Notes (Signed)
 See first nurse note. Pt reports HA x5 days. Bilateral leg weakness. Intermittent SHOB. Pt also has a hx of htn and states that he has been taking his medications as prescribed. States that his BP has still been high the past couple of days.

## 2023-05-24 NOTE — Discharge Instructions (Signed)
 You should expect to hear from [facility] to have an appointment scheduled with a specialist for your meningioma.  You will need to take Decadron twice daily for the next month to help with the swelling around the meningioma.  We have also increased your dose of amlodipine from 5 mg to 10 mg daily.  Please have your blood pressure rechecked either with cardiology or your primary care doctor.  Please return to the emergency room for any new or worsening symptoms.

## 2023-05-24 NOTE — ED Notes (Signed)
 First nurse note: Pt here from [facility] clinic with c/o multiple complaints, SOB, HA, Pt has HX of HTN. HA has been ongoing for 5 days.

## 2023-05-24 NOTE — ED Provider Notes (Signed)
 Procedures     ----------------------------------------- 6:54 PM on 05/24/2023 ----------------------------------------- MRI completed, report not yet available.  Patient is not willing to wait any longer for the results.  Since neurosurgery has already advised that urgent intervention in the hospital is not required, and is planning clinic follow-up, he can be discharged, with MRI results followed up in the office.  Prescription for Decadron already sent by Dr. Larinda Buttery.  Stable for discharge     Sharman Cheek, MD 05/24/23 [phone]

## 2023-05-24 NOTE — ED Provider Notes (Signed)
 [facility] Provider Note    Event Date/Time   First MD Initiated Contact with Patient 05/24/23 1238     (approximate)   History   Chief Complaint Hypertension and Shortness of Breath   HPI  Jason Washington is a 78 y.o. male with past medical history of hypertension, GERD, and mild cognitive deficit who presents to the ED complaining of hypertension and headache.  Patient reports that he has had a gradually worsening diffuse throbbing headache over the past 3 days.  He denies any numbness or weakness in his extremities, has not had any fevers or neck stiffness.  He does state that his voice has been hoarse at times but he denies any other speech changes or vision changes.  He does state that his blood pressure has been running high when family has checked it over the past couple of days, denies any recent changes in his blood pressure medications or any missed doses.  He has not had any pain in his chest, but has had occasional episodes of difficulty breathing, denies shortness of breath currently.     Physical Exam   Triage Vital Signs: ED Triage Vitals  Encounter Vitals Group     BP 05/24/23 1203 (!) 190/87     Systolic BP Percentile --      Diastolic BP Percentile --      Pulse Rate 05/24/23 1203 65     Resp 05/24/23 1203 18     Temp 05/24/23 1203 99 F (37.2 C)     Temp Source 05/24/23 1203 Oral     SpO2 05/24/23 1203 94 %     Weight 05/24/23 1204 257 lb (116.6 kg)     Height 05/24/23 1204 5\' 11"  (1.803 m)     Head Circumference --      Peak Flow --      Pain Score 05/24/23 1203 0     Pain Loc --      Pain Education --      Exclude from Growth Chart --     Most recent vital signs: Vitals:   05/24/23 1355 05/24/23 1400  BP: (!) 196/80   Pulse:  64  Resp:    Temp:    SpO2: 96% 91%    Constitutional: Alert and oriented. Eyes: Conjunctivae are normal. Head: Atraumatic.  No temporal tenderness bilaterally. Nose: No  congestion/rhinnorhea. Mouth/Throat: Mucous membranes are moist.  Neck: Supple with no meningismus. Cardiovascular: Normal rate, regular rhythm. Grossly normal heart sounds.  2+ radial pulses bilaterally. Respiratory: Normal respiratory effort.  No retractions. Lungs CTAB. Gastrointestinal: Soft and nontender. No distention. Musculoskeletal: No lower extremity tenderness nor edema.  Neurologic:  Normal speech and language. No gross focal neurologic deficits are appreciated.    ED Results / Procedures / Treatments   Labs (all labs ordered are listed, but only abnormal results are displayed) Labs Reviewed  COMPREHENSIVE METABOLIC PANEL WITH GFR - Abnormal; Notable for the following components:      Result Value   Potassium 3.1 (*)    Glucose, Bld 107 (*)    All other components within normal limits  TROPONIN I (HIGH SENSITIVITY) - Abnormal; Notable for the following components:   Troponin I (High Sensitivity) 38 (*)    All other components within normal limits  CBC WITH DIFFERENTIAL/PLATELET  TROPONIN I (HIGH SENSITIVITY)     EKG  ED ECG REPORT I, Chesley Noon, the attending physician, personally viewed and interpreted this ECG.   Date:  05/24/2023  EKG Time: 12:08  Rate: 59  Rhythm: sinus bradycardia  Axis: Normal  Intervals:right bundle branch block  ST&T Change: None  RADIOLOGY CT head reviewed and interpreted by me with calcified mass in the left frontal lobe with associated midline shift.  PROCEDURES:  Critical Care performed: No  Procedures   MEDICATIONS ORDERED IN ED: Medications  potassium chloride SA (KLOR-CON M) CR tablet 40 mEq (40 mEq Oral Given 05/24/23 1455)  amLODipine (NORVASC) tablet 5 mg (5 mg Oral Given 05/24/23 1455)     IMPRESSION / MDM / ASSESSMENT AND PLAN / ED COURSE  I reviewed the triage vital signs and the nursing notes.                              78 y.o. male with past medical history of hypertension, GERD, and mild cognitive  deficit who presents to the ED complaining of gradually worsening diffuse headache over the past 3 days along with elevated blood pressure and intermittent shortness of breath.  Patient's presentation is most consistent with acute presentation with potential threat to life or bodily function.  Differential diagnosis includes, but is not limited to, stroke, ICH, ACS, anemia, electrolyte abnormality, AKI, hypertensive emergency, medication noncompliance, tension headache, migraine headache, SAH, meningitis.  Patient nontoxic-appearing and in no acute distress, vital signs remarkable for hypertension but otherwise reassuring.  Patient is at his baseline mental status with no focal neurologic deficits on exam, no findings concerning for [facility] or meningitis.  Given new onset headache in this elderly patient, will check CT head.  EKG shows no evidence of arrhythmia or ischemia, troponin pending at this time but he is not in any respiratory distress and maintaining oxygen saturations on room air.  Labs thus far with mild hypokalemia but no significant AKI, anemia, or leukocytosis.  Chest x-ray also pending.  CT head with large hyperdense area in the left frontal lobe, reviewed with radiology who states that appears to be a meningioma with associated edema and midline shift.  Findings reviewed with Dr. Marcell Barlow of neurosurgery, who recommends MRI here in the ED and then outpatient management and will provide with referral to neurosurgery at [facility].  He recommends starting patient on 0.5 mg dexamethasone twice daily for the next month.  Labs without significant anemia, leukocytosis, or AKI, but there is mild hypokalemia that we will replete.  Patient also with mild elevation in troponin, no prior for comparison available.  He denies any chest pain or shortness of breath at this time, will trend troponin but if this is stable then patient would be appropriate for discharge home with outpatient follow-up with cardiology  or PCP.  We will increase his dose of amlodipine to 10 mg daily.  Patient turned over to medical provider pending MRI results and repeat troponin.      FINAL CLINICAL IMPRESSION(S) / ED DIAGNOSES   Final diagnoses:  Acute nonintractable headache, unspecified headache type  Meningioma (HCC)  Uncontrolled hypertension     Rx / DC Orders   ED Discharge Orders          Ordered    Ambulatory referral to Cardiology        05/24/23 1513    amLODipine (NORVASC) 10 MG tablet  Daily        05/24/23 1513    dexamethasone (DECADRON) 0.5 MG tablet  2 times daily        05/24/23 1513  Note:  This document was prepared using Dragon voice recognition software and may include unintentional dictation errors.   Chesley Noon, MD 05/24/23 [phone]

## 2023-05-30 DIAGNOSIS — Z5321 Procedure and treatment not carried out due to patient leaving prior to being seen by health care provider: Secondary | ICD-10-CM | POA: Diagnosis not present

## 2023-05-30 DIAGNOSIS — R519 Headache, unspecified: Secondary | ICD-10-CM | POA: Diagnosis not present

## 2023-05-30 DIAGNOSIS — R030 Elevated blood-pressure reading, without diagnosis of hypertension: Secondary | ICD-10-CM | POA: Diagnosis not present

## 2023-05-30 DIAGNOSIS — Z5329 Procedure and treatment not carried out because of patient's decision for other reasons: Secondary | ICD-10-CM | POA: Diagnosis not present

## 2023-05-30 DIAGNOSIS — D329 Benign neoplasm of meninges, unspecified: Secondary | ICD-10-CM | POA: Diagnosis not present

## 2023-05-30 DIAGNOSIS — M542 Cervicalgia: Secondary | ICD-10-CM | POA: Diagnosis not present

## 2023-06-16 ENCOUNTER — Telehealth: Payer: Self-pay | Admitting: *Deleted

## 2023-06-16 NOTE — Telephone Encounter (Signed)
 Contacted by triage nurse that patients wife wanted to speak to me.   I returned call to wife.  They wanted assistance getting someone to go over the results of MRI he had done in the ER.   They left before the radiology read was performed.   I suggested they contact PCP to review results but the PCP has been out of the office and they have not had any luck with the office helping them.   I sent an email to the ED nurse navigator to see if she could assist.  The patient does have appt. At [facility] with a specialist on 5/15.

## 2023-06-17 NOTE — Telephone Encounter (Signed)
 I called the patient and wife.  He did get appointment with neurosurgeon within days of ED visit, but they told him he needs a meningioma specialist.  So he has an appt on 5/15.  He is going to run out of the decadron  in a few days.  He has been taking extra blood pressure meds as instructed by pcp to keep his elevated pressures down.  His pcp is on leave.  I explained that it is likely that he may need to stay on the decadron  until the appointment and that he really needed an ED follow up anyway--also to check on bp meds.  Advised that they can try pcp again, but that he can call the neurosurgeon at [facility] to ask about the decadron .  He can go to [facility] for ED follow up visit, and if all fails he can certainly come back to the ED and we can reevaluate him.

## 2023-06-22 DIAGNOSIS — I10 Essential (primary) hypertension: Secondary | ICD-10-CM | POA: Diagnosis not present

## 2023-06-30 DIAGNOSIS — D329 Benign neoplasm of meninges, unspecified: Secondary | ICD-10-CM | POA: Diagnosis not present

## 2023-07-01 ENCOUNTER — Other Ambulatory Visit: Payer: Self-pay | Admitting: Cardiology

## 2023-07-01 DIAGNOSIS — Z720 Tobacco use: Secondary | ICD-10-CM | POA: Diagnosis not present

## 2023-07-01 DIAGNOSIS — R0609 Other forms of dyspnea: Secondary | ICD-10-CM

## 2023-07-01 DIAGNOSIS — I2584 Coronary atherosclerosis due to calcified coronary lesion: Secondary | ICD-10-CM | POA: Diagnosis not present

## 2023-07-01 DIAGNOSIS — I2510 Atherosclerotic heart disease of native coronary artery without angina pectoris: Secondary | ICD-10-CM | POA: Diagnosis not present

## 2023-07-01 DIAGNOSIS — I252 Old myocardial infarction: Secondary | ICD-10-CM | POA: Diagnosis not present

## 2023-07-01 DIAGNOSIS — I10 Essential (primary) hypertension: Secondary | ICD-10-CM

## 2023-07-01 DIAGNOSIS — I6521 Occlusion and stenosis of right carotid artery: Secondary | ICD-10-CM | POA: Diagnosis not present

## 2023-07-05 ENCOUNTER — Encounter (INDEPENDENT_AMBULATORY_CARE_PROVIDER_SITE_OTHER): Payer: Self-pay

## 2023-07-06 DIAGNOSIS — I10 Essential (primary) hypertension: Secondary | ICD-10-CM | POA: Diagnosis not present

## 2023-07-06 DIAGNOSIS — E782 Mixed hyperlipidemia: Secondary | ICD-10-CM | POA: Diagnosis not present

## 2023-07-06 DIAGNOSIS — E119 Type 2 diabetes mellitus without complications: Secondary | ICD-10-CM | POA: Diagnosis not present

## 2023-07-08 DIAGNOSIS — Z01818 Encounter for other preprocedural examination: Secondary | ICD-10-CM | POA: Diagnosis not present

## 2023-07-08 DIAGNOSIS — E782 Mixed hyperlipidemia: Secondary | ICD-10-CM | POA: Diagnosis not present

## 2023-07-08 DIAGNOSIS — E119 Type 2 diabetes mellitus without complications: Secondary | ICD-10-CM | POA: Diagnosis not present

## 2023-07-08 DIAGNOSIS — K449 Diaphragmatic hernia without obstruction or gangrene: Secondary | ICD-10-CM | POA: Diagnosis not present

## 2023-07-08 DIAGNOSIS — K219 Gastro-esophageal reflux disease without esophagitis: Secondary | ICD-10-CM | POA: Diagnosis not present

## 2023-07-08 DIAGNOSIS — I10 Essential (primary) hypertension: Secondary | ICD-10-CM | POA: Diagnosis not present

## 2023-07-08 DIAGNOSIS — G4733 Obstructive sleep apnea (adult) (pediatric): Secondary | ICD-10-CM | POA: Diagnosis not present

## 2023-07-13 DIAGNOSIS — E119 Type 2 diabetes mellitus without complications: Secondary | ICD-10-CM | POA: Diagnosis not present

## 2023-07-13 DIAGNOSIS — C61 Malignant neoplasm of prostate: Secondary | ICD-10-CM | POA: Diagnosis not present

## 2023-07-13 DIAGNOSIS — G629 Polyneuropathy, unspecified: Secondary | ICD-10-CM | POA: Diagnosis not present

## 2023-07-13 DIAGNOSIS — A6000 Herpesviral infection of urogenital system, unspecified: Secondary | ICD-10-CM | POA: Diagnosis not present

## 2023-07-13 DIAGNOSIS — Z0000 Encounter for general adult medical examination without abnormal findings: Secondary | ICD-10-CM | POA: Diagnosis not present

## 2023-07-13 DIAGNOSIS — E782 Mixed hyperlipidemia: Secondary | ICD-10-CM | POA: Diagnosis not present

## 2023-07-13 DIAGNOSIS — Z87891 Personal history of nicotine dependence: Secondary | ICD-10-CM | POA: Diagnosis not present

## 2023-07-13 DIAGNOSIS — M47816 Spondylosis without myelopathy or radiculopathy, lumbar region: Secondary | ICD-10-CM | POA: Diagnosis not present

## 2023-07-13 DIAGNOSIS — R0609 Other forms of dyspnea: Secondary | ICD-10-CM | POA: Diagnosis not present

## 2023-07-13 DIAGNOSIS — J449 Chronic obstructive pulmonary disease, unspecified: Secondary | ICD-10-CM | POA: Diagnosis not present

## 2023-07-13 DIAGNOSIS — I252 Old myocardial infarction: Secondary | ICD-10-CM | POA: Diagnosis not present

## 2023-07-13 DIAGNOSIS — M51369 Other intervertebral disc degeneration, lumbar region without mention of lumbar back pain or lower extremity pain: Secondary | ICD-10-CM | POA: Diagnosis not present

## 2023-07-13 DIAGNOSIS — K219 Gastro-esophageal reflux disease without esophagitis: Secondary | ICD-10-CM | POA: Diagnosis not present

## 2023-07-13 DIAGNOSIS — I2584 Coronary atherosclerosis due to calcified coronary lesion: Secondary | ICD-10-CM | POA: Diagnosis not present

## 2023-07-13 DIAGNOSIS — Z1331 Encounter for screening for depression: Secondary | ICD-10-CM | POA: Diagnosis not present

## 2023-07-13 DIAGNOSIS — I10 Essential (primary) hypertension: Secondary | ICD-10-CM | POA: Diagnosis not present

## 2023-07-15 ENCOUNTER — Encounter
Admission: RE | Admit: 2023-07-15 | Discharge: 2023-07-15 | Disposition: A | Discharge status: Home / Self Care | Source: Ambulatory Visit | Attending: Cardiology | Admitting: Cardiology

## 2023-07-15 DIAGNOSIS — Z720 Tobacco use: Secondary | ICD-10-CM | POA: Diagnosis not present

## 2023-07-15 DIAGNOSIS — R0609 Other forms of dyspnea: Secondary | ICD-10-CM | POA: Insufficient documentation

## 2023-07-15 DIAGNOSIS — I252 Old myocardial infarction: Secondary | ICD-10-CM | POA: Diagnosis not present

## 2023-07-15 DIAGNOSIS — I2510 Atherosclerotic heart disease of native coronary artery without angina pectoris: Secondary | ICD-10-CM | POA: Insufficient documentation

## 2023-07-15 DIAGNOSIS — I10 Essential (primary) hypertension: Secondary | ICD-10-CM | POA: Diagnosis not present

## 2023-07-15 DIAGNOSIS — I2584 Coronary atherosclerosis due to calcified coronary lesion: Secondary | ICD-10-CM | POA: Insufficient documentation

## 2023-07-15 LAB — NM MYOCAR MULTI W/SPECT W/WALL MOTION / EF
Estimated workload: 1
Exercise duration (min): 1 min
Exercise duration (sec): 0 s
LV dias vol: 142 mL (ref 62–150)
LV sys vol: 55 mL
MPHR: 142 {beats}/min
Nuc Stress EF: 61 %
Peak HR: 88 {beats}/min
Percent HR: 61 %
Rest HR: 72 {beats}/min
Rest Nuclear Isotope Dose: 10.5 mCi
SDS: 1
SRS: 2
SSS: 1
ST Depression (mm): 0 mm
Stress Nuclear Isotope Dose: 29.6 mCi
TID: 0.95

## 2023-07-15 MED ORDER — TECHNETIUM TC 99M TETROFOSMIN IV KIT
10.5200 | PACK | Freq: Once | INTRAVENOUS | Status: AC | PRN
Start: 2023-07-15 — End: 2023-07-15
  Administered 2023-07-15: 10.52 via INTRAVENOUS

## 2023-07-15 MED ORDER — REGADENOSON 0.4 MG/5ML IV SOLN
0.4000 mg | Freq: Once | INTRAVENOUS | Status: AC
  Administered 2023-07-15: 0.4 mg via INTRAVENOUS
  Filled 2023-07-15: qty 5

## 2023-07-15 MED ORDER — TECHNETIUM TC 99M TETROFOSMIN IV KIT
29.6400 | PACK | Freq: Once | INTRAVENOUS | Status: AC | PRN
Start: 2023-07-15 — End: 2023-07-15
  Administered 2023-07-15: 29.64 via INTRAVENOUS

## 2023-07-20 DIAGNOSIS — G936 Cerebral edema: Secondary | ICD-10-CM | POA: Diagnosis not present

## 2023-07-20 DIAGNOSIS — D320 Benign neoplasm of cerebral meninges: Secondary | ICD-10-CM | POA: Diagnosis not present

## 2023-07-20 DIAGNOSIS — R400 Somnolence: Secondary | ICD-10-CM | POA: Diagnosis not present

## 2023-07-20 DIAGNOSIS — R918 Other nonspecific abnormal finding of lung field: Secondary | ICD-10-CM | POA: Diagnosis not present

## 2023-07-20 DIAGNOSIS — D329 Benign neoplasm of meninges, unspecified: Secondary | ICD-10-CM | POA: Diagnosis not present

## 2023-07-20 DIAGNOSIS — R931 Abnormal findings on diagnostic imaging of heart and coronary circulation: Secondary | ICD-10-CM | POA: Diagnosis not present

## 2023-07-20 DIAGNOSIS — N200 Calculus of kidney: Secondary | ICD-10-CM | POA: Diagnosis not present

## 2023-07-20 DIAGNOSIS — F17210 Nicotine dependence, cigarettes, uncomplicated: Secondary | ICD-10-CM | POA: Diagnosis not present

## 2023-07-20 DIAGNOSIS — G9340 Encephalopathy, unspecified: Secondary | ICD-10-CM | POA: Diagnosis not present

## 2023-07-20 DIAGNOSIS — N132 Hydronephrosis with renal and ureteral calculous obstruction: Secondary | ICD-10-CM | POA: Diagnosis not present

## 2023-07-21 DIAGNOSIS — R569 Unspecified convulsions: Secondary | ICD-10-CM | POA: Diagnosis not present

## 2023-07-21 DIAGNOSIS — D329 Benign neoplasm of meninges, unspecified: Secondary | ICD-10-CM | POA: Diagnosis not present

## 2023-07-21 DIAGNOSIS — N200 Calculus of kidney: Secondary | ICD-10-CM | POA: Diagnosis not present

## 2023-07-21 DIAGNOSIS — D320 Benign neoplasm of cerebral meninges: Secondary | ICD-10-CM | POA: Diagnosis not present

## 2023-07-21 DIAGNOSIS — G9340 Encephalopathy, unspecified: Secondary | ICD-10-CM | POA: Diagnosis not present

## 2023-07-21 DIAGNOSIS — G936 Cerebral edema: Secondary | ICD-10-CM | POA: Diagnosis not present

## 2023-07-21 DIAGNOSIS — N132 Hydronephrosis with renal and ureteral calculous obstruction: Secondary | ICD-10-CM | POA: Diagnosis not present

## 2023-07-21 DIAGNOSIS — R400 Somnolence: Secondary | ICD-10-CM | POA: Diagnosis not present

## 2023-07-22 DIAGNOSIS — R931 Abnormal findings on diagnostic imaging of heart and coronary circulation: Secondary | ICD-10-CM | POA: Diagnosis not present

## 2023-07-22 DIAGNOSIS — D329 Benign neoplasm of meninges, unspecified: Secondary | ICD-10-CM | POA: Diagnosis not present

## 2023-07-22 DIAGNOSIS — N132 Hydronephrosis with renal and ureteral calculous obstruction: Secondary | ICD-10-CM | POA: Diagnosis not present

## 2023-07-22 DIAGNOSIS — R918 Other nonspecific abnormal finding of lung field: Secondary | ICD-10-CM | POA: Diagnosis not present

## 2023-07-22 DIAGNOSIS — G9340 Encephalopathy, unspecified: Secondary | ICD-10-CM | POA: Diagnosis not present

## 2023-07-23 DIAGNOSIS — G9340 Encephalopathy, unspecified: Secondary | ICD-10-CM | POA: Diagnosis not present

## 2023-07-23 DIAGNOSIS — N132 Hydronephrosis with renal and ureteral calculous obstruction: Secondary | ICD-10-CM | POA: Diagnosis not present

## 2023-07-23 DIAGNOSIS — D329 Benign neoplasm of meninges, unspecified: Secondary | ICD-10-CM | POA: Diagnosis not present

## 2023-07-25 ENCOUNTER — Other Ambulatory Visit

## 2023-08-10 DIAGNOSIS — R350 Frequency of micturition: Secondary | ICD-10-CM | POA: Diagnosis not present

## 2023-08-10 DIAGNOSIS — I10 Essential (primary) hypertension: Secondary | ICD-10-CM | POA: Diagnosis not present

## 2023-08-10 DIAGNOSIS — N200 Calculus of kidney: Secondary | ICD-10-CM | POA: Diagnosis not present

## 2023-08-10 DIAGNOSIS — D329 Benign neoplasm of meninges, unspecified: Secondary | ICD-10-CM | POA: Diagnosis not present

## 2023-08-10 DIAGNOSIS — R413 Other amnesia: Secondary | ICD-10-CM | POA: Diagnosis not present

## 2023-08-10 DIAGNOSIS — N390 Urinary tract infection, site not specified: Secondary | ICD-10-CM | POA: Diagnosis not present

## 2023-08-23 ENCOUNTER — Ambulatory Visit: Admitting: Dermatology

## 2023-08-23 DIAGNOSIS — L570 Actinic keratosis: Secondary | ICD-10-CM

## 2023-08-23 DIAGNOSIS — L820 Inflamed seborrheic keratosis: Secondary | ICD-10-CM

## 2023-08-23 DIAGNOSIS — Z7189 Other specified counseling: Secondary | ICD-10-CM

## 2023-08-23 DIAGNOSIS — N202 Calculus of kidney with calculus of ureter: Secondary | ICD-10-CM | POA: Diagnosis not present

## 2023-08-23 DIAGNOSIS — L821 Other seborrheic keratosis: Secondary | ICD-10-CM

## 2023-08-23 DIAGNOSIS — W908XXA Exposure to other nonionizing radiation, initial encounter: Secondary | ICD-10-CM

## 2023-08-23 DIAGNOSIS — Z79899 Other long term (current) drug therapy: Secondary | ICD-10-CM

## 2023-08-23 DIAGNOSIS — L578 Other skin changes due to chronic exposure to nonionizing radiation: Secondary | ICD-10-CM

## 2023-08-23 DIAGNOSIS — N201 Calculus of ureter: Secondary | ICD-10-CM | POA: Diagnosis not present

## 2023-08-23 DIAGNOSIS — Z5111 Encounter for antineoplastic chemotherapy: Secondary | ICD-10-CM | POA: Diagnosis not present

## 2023-08-23 DIAGNOSIS — N200 Calculus of kidney: Secondary | ICD-10-CM | POA: Diagnosis not present

## 2023-08-23 DIAGNOSIS — F17210 Nicotine dependence, cigarettes, uncomplicated: Secondary | ICD-10-CM | POA: Diagnosis not present

## 2023-08-23 MED ORDER — FLUOROURACIL 5 % EX CREA: TOPICAL_CREAM | CUTANEOUS | 1 refills | Status: DC

## 2023-08-23 NOTE — Progress Notes (Unsigned)
 Follow-Up Visit   Subjective  Jason Washington is a 78 y.o. male who presents for the following: 8 months f/u on precancers on his face and scalp, treated with 5FU/Calcipotriene cream last ~6 months ago with a good response.   Patient having surgery on his scalp 11/05/2023 Left Orbitocranial approach for resection of brain mass, with brainlab  The following portions of the chart were reviewed this encounter and updated as appropriate: medications, allergies, medical history  Review of Systems:  No other skin or systemic complaints except as noted in HPI or Assessment and Plan.  Objective  Well appearing patient in no apparent distress; mood and affect are within normal limits.  A focused examination was performed of the following areas: Face,scalp,arms,hands    Relevant exam findings are noted in the Assessment and Plan.  scalp, (20) Erythematous thin papules/macules with gritty scale.  scalp, arms,hands (39) Stuck-on, waxy, tan-brown papules and plaques -- Discussed benign etiology and prognosis.   Assessment & Plan    AK (ACTINIC KERATOSIS) (20) scalp, (20) ACTINIC DAMAGE WITH PRECANCEROUS ACTINIC KERATOSES Counseling for Topical Chemotherapy Management: Patient exhibits: - Severe, confluent actinic changes with pre-cancerous actinic keratoses that is secondary to cumulative UV radiation exposure over time - Condition that is severe; chronic, not at goal. - diffuse scaly erythematous macules and papules with underlying dyspigmentation - Discussed Prescription Field Treatment topical Chemotherapy for Severe, Chronic Confluent Actinic Changes with Pre-Cancerous Actinic Keratoses Field treatment involves treatment of an entire area of skin that has confluent Actinic Changes (Sun/ Ultraviolet light damage) and PreCancerous Actinic Keratoses by method of PhotoDynamic Therapy (PDT) and/or prescription Topical Chemotherapy agents such as 5-fluorouracil ,  5-fluorouracil /calcipotriene, and/or imiquimod.  The purpose is to decrease the number of clinically evident and subclinical PreCancerous lesions to prevent progression to development of skin cancer by chemically destroying early precancer changes that may or may not be visible.  It has been shown to reduce the risk of developing skin cancer in the treated area. As a result of treatment, redness, scaling, crusting, and open sores may occur during treatment course. One or more than one of these methods may be used and may have to be used several times to control, suppress and eliminate the PreCancerous changes. Discussed treatment course, expected reaction, and possible side effects. - Recommend daily broad spectrum sunscreen SPF 30+ to sun-exposed areas, reapply every 2 hours as needed.  - Staying in the shade or wearing long sleeves, sun glasses (UVA+UVB protection) and wide brim hats (4-inch brim around the entire circumference of the hat) are also recommended. - Call for new or changing lesions.   August 1- re start 5FU/Calcipotriene cream  apply to scalp twice a day for 10 days, then stop   Destruction of lesion - scalp, (20) Complexity: simple   Destruction method: cryotherapy   Informed consent: discussed and consent obtained   Timeout:  patient name, date of birth, surgical site, and procedure verified Lesion destroyed using liquid nitrogen: Yes   Region frozen until ice ball extended beyond lesion: Yes   Outcome: patient tolerated procedure well with no complications   Post-procedure details: wound care instructions given    INFLAMED SEBORRHEIC KERATOSIS (39) scalp, arms,hands (39) Symptomatic, irritating, patient would like treated.  Destruction of lesion - scalp, arms,hands (39) Complexity: simple   Destruction method: cryotherapy   Informed consent: discussed and consent obtained   Timeout:  patient name, date of birth, surgical site, and procedure verified Lesion destroyed using  liquid nitrogen:  Yes   Region frozen until ice ball extended beyond lesion: Yes   Outcome: patient tolerated procedure well with no complications   Post-procedure details: wound care instructions given     SEBORRHEIC KERATOSIS - Stuck-on, waxy, tan-brown papules and/or plaques  - Benign-appearing - Discussed benign etiology and prognosis. - Observe - Call for any changes  Return in about 9 months (around 05/23/2024) for Aks, ISKs.  IFay Kirks, CMA, am acting as scribe for Alm Rhyme, MD .   Documentation: I have reviewed the above documentation for accuracy and completeness, and I agree with the above.  Alm Rhyme, MD

## 2023-08-23 NOTE — Patient Instructions (Addendum)
 5-Fluorouracil /Calcipotriene Patient Education  August 1- re start 5FU/Calcipotriene cream  apply to scalp twice a day for 10 days  Actinic keratoses are the dry, red scaly spots on the skin caused by sun damage. A portion of these spots can turn into skin cancer with time, and treating them can help prevent development of skin cancer.   Treatment of these spots requires removal of the defective skin cells. There are various ways to remove actinic keratoses, including freezing with liquid nitrogen, treatment with creams, or treatment with a blue light procedure in the office.   5-fluorouracil  cream is a topical cream used to treat actinic keratoses. It works by interfering with the growth of abnormal fast-growing skin cells, such as actinic keratoses. These cells peel off and are replaced by healthy ones.   5-fluorouracil /calcipotriene is a combination of the 5-fluorouracil  cream with a vitamin D analog cream called calcipotriene. The calcipotriene alone does not treat actinic keratoses. However, when it is combined with 5-fluorouracil , it helps the 5-fluorouracil  treat the actinic keratoses much faster so that the same results can be achieved with a much shorter treatment time.  INSTRUCTIONS FOR 5-FLUOROURACIL /CALCIPOTRIENE CREAM:   5-fluorouracil /calcipotriene cream typically only needs to be used for 4-7 days. A thin layer should be applied twice a day to the treatment areas recommended by your physician.   If your physician prescribed you separate tubes of 5-fluourouracil and calcipotriene, apply a thin layer of 5-fluorouracil  followed by a thin layer of calcipotriene.   Avoid contact with your eyes, nostrils, and mouth. Do not use 5-fluorouracil /calcipotriene cream on infected or open wounds.   You will develop redness, irritation and some crusting at areas where you have pre-cancer damage/actinic keratoses. IF YOU DEVELOP PAIN, BLEEDING, OR SIGNIFICANT CRUSTING, STOP THE TREATMENT EARLY - you  have already gotten a good response and the actinic keratoses should clear up well.  Wash your hands after applying 5-fluorouracil  5% cream on your skin.   A moisturizer or sunscreen with a minimum SPF 30 should be applied each morning.   Once you have finished the treatment, you can apply a thin layer of Vaseline twice a day to irritated areas to soothe and calm the areas more quickly. If you experience significant discomfort, contact your physician.  For some patients it is necessary to repeat the treatment for best results.  SIDE EFFECTS: When using 5-fluorouracil /calcipotriene cream, you may have mild irritation, such as redness, dryness, swelling, or a mild burning sensation. This usually resolves within 2 weeks. The more actinic keratoses you have, the more redness and inflammation you can expect during treatment. Eye irritation has been reported rarely. If this occurs, please let us  know.  If you have any trouble using this cream, please call the office. If you have any other questions about this information, please do not hesitate to ask me before you leave the office.   Due to recent changes in healthcare laws, you may see results of your pathology and/or laboratory studies on MyChart before the doctors have had a chance to review them. We understand that in some cases there may be results that are confusing or concerning to you. Please understand that not all results are received at the same time and often the doctors may need to interpret multiple results in order to provide you with the best plan of care or course of treatment. Therefore, we ask that you please give us  2 business days to thoroughly review all your results before contacting the office for clarification.  Should we see a critical lab result, you will be contacted sooner.   If You Need Anything After Your Visit  If you have any questions or concerns for your doctor, please call our main line at [phone] and press  option 4 to reach your doctor's medical assistant. If no one answers, please leave a voicemail as directed and we will return your call as soon as possible. Messages left after 4 pm will be answered the following business day.   You may also send us  a message via MyChart. We typically respond to MyChart messages within 1-2 business days.  For prescription refills, please ask your pharmacy to contact our office. Our fax number is [phone].  If you have an urgent issue when the clinic is closed that cannot wait until the next business day, you can page your doctor at the number below.    Please note that while we do our best to be available for urgent issues outside of office hours, we are not available 24/7.   If you have an urgent issue and are unable to reach us , you may choose to seek medical care at your doctor's office, retail clinic, urgent care center, or emergency room.  If you have a medical emergency, please immediately call 911 or go to the emergency department.  Pager Numbers  - Dr. Hester: [phone]  - Dr. Jackquline: [phone]  - Dr. Claudene: [phone]   In the event of inclement weather, please call our main line at [phone] for an update on the status of any delays or closures.  Dermatology Medication Tips: Please keep the boxes that topical medications come in in order to help keep track of the instructions about where and how to use these. Pharmacies typically print the medication instructions only on the boxes and not directly on the medication tubes.   If your medication is too expensive, please contact our office at [phone] option 4 or send us  a message through MyChart.   We are unable to tell what your co-pay for medications will be in advance as this is different depending on your insurance coverage. However, we may be able to find a substitute medication at lower cost or fill out paperwork to get insurance to cover a needed medication.   If a  prior authorization is required to get your medication covered by your insurance company, please allow us  1-2 business days to complete this process.  Drug prices often vary depending on where the prescription is filled and some pharmacies may offer cheaper prices.  The website www.goodrx.com contains coupons for medications through different pharmacies. The prices here do not account for what the cost may be with help from insurance (it may be cheaper with your insurance), but the website can give you the price if you did not use any insurance.  - You can print the associated coupon and take it with your prescription to the pharmacy.  - You may also stop by our office during regular business hours and pick up a GoodRx coupon card.  - If you need your prescription sent electronically to a different pharmacy, notify our office through [facility] or by phone at [phone] option 4.     Si Usted Necesita Algo Despus de Su Visita  Tambin puede enviarnos un mensaje a travs de Clinical cytogeneticist. Por lo general respondemos a los mensajes de MyChart en el transcurso de 1 a 2 das hbiles.  Para renovar recetas, por favor pida a su farmacia que se  ponga en contacto con nuestra oficina. Randi lakes de fax es [place] [phone].  Si tiene un asunto urgente cuando la clnica est cerrada y que no puede esperar hasta el siguiente da hbil, puede llamar/localizar a su doctor(a) al nmero que aparece a continuacin.   Por favor, tenga en cuenta que aunque hacemos todo lo posible para estar disponibles para asuntos urgentes fuera del horario de [address], no estamos disponibles las 24 horas del da, los 7 [address]  [address] de la [place].   Si tiene un problema urgente y no puede comunicarse con nosotros, puede optar por buscar atencin mdica  en el consultorio de su doctor(a), en una clnica privada, en un centro de atencin urgente o en una sala de emergencias.  Si tiene Engineer, drilling, por favor llame  inmediatamente al 911 o vaya a la sala de emergencias.  Nmeros de bper  - Dr. Hester: [phone]  - Dra. Jackquline: [phone]  - Dr. Claudene: [phone]   En caso de inclemencias del tiempo, por favor llame a landry capes principal al [phone] para una actualizacin sobre el [place] de cualquier retraso o cierre.  Consejos para la medicacin en dermatologa: Por favor, guarde las cajas en las que vienen los medicamentos de uso tpico para ayudarle a seguir las instrucciones sobre dnde y cmo usarlos. Las farmacias generalmente imprimen las instrucciones del medicamento slo en las cajas y no directamente en los tubos del [place].   Si su medicamento es muy caro, por favor, pngase en contacto con landry rieger llamando al [phone] y presione la opcin 4 o envenos un mensaje a travs de Clinical cytogeneticist.   No podemos decirle cul ser su copago por los medicamentos por adelantado ya que esto es diferente dependiendo de la cobertura de su seguro. Sin embargo, es posible que podamos encontrar un medicamento sustituto a Audiological scientist un formulario para que el seguro cubra el medicamento que se considera necesario.   Si se requiere una autorizacin previa para que su compaa de seguros malta su medicamento, por favor permtanos de 1 a 2 das hbiles para completar este proceso.  Los precios de los medicamentos varan con frecuencia dependiendo del Environmental consultant de dnde se surte la receta y alguna farmacias pueden ofrecer precios ms baratos.  El sitio web www.goodrx.com tiene cupones para medicamentos de Health and safety inspector. Los precios aqu no tienen en cuenta lo que podra costar con la ayuda del seguro (puede ser ms barato con su seguro), pero el sitio web puede darle el precio si no utiliz Tourist information centre manager.  - Puede imprimir el cupn correspondiente y llevarlo con su receta a la farmacia.  - Tambin puede pasar por nuestra oficina durante el horario de atencin regular y  Education officer, museum una tarjeta de cupones de GoodRx.  - Si necesita que su receta se enve electrnicamente a una farmacia diferente, informe a nuestra oficina a travs de MyChart de [place] o por telfono llamando al [phone] y presione la opcin 4.

## 2023-08-24 ENCOUNTER — Encounter: Payer: Self-pay | Admitting: Dermatology

## 2023-08-24 ENCOUNTER — Ambulatory Visit: Payer: PPO | Admitting: Dermatology

## 2023-09-20 DIAGNOSIS — E782 Mixed hyperlipidemia: Secondary | ICD-10-CM | POA: Diagnosis not present

## 2023-09-20 DIAGNOSIS — I2510 Atherosclerotic heart disease of native coronary artery without angina pectoris: Secondary | ICD-10-CM | POA: Diagnosis not present

## 2023-09-20 DIAGNOSIS — I10 Essential (primary) hypertension: Secondary | ICD-10-CM | POA: Diagnosis not present

## 2023-09-20 DIAGNOSIS — Z720 Tobacco use: Secondary | ICD-10-CM | POA: Diagnosis not present

## 2023-10-13 DIAGNOSIS — G629 Polyneuropathy, unspecified: Secondary | ICD-10-CM | POA: Diagnosis not present

## 2023-10-13 DIAGNOSIS — M47816 Spondylosis without myelopathy or radiculopathy, lumbar region: Secondary | ICD-10-CM | POA: Diagnosis not present

## 2023-10-13 DIAGNOSIS — G4733 Obstructive sleep apnea (adult) (pediatric): Secondary | ICD-10-CM | POA: Diagnosis not present

## 2023-10-13 DIAGNOSIS — E538 Deficiency of other specified B group vitamins: Secondary | ICD-10-CM | POA: Diagnosis not present

## 2023-10-13 DIAGNOSIS — K219 Gastro-esophageal reflux disease without esophagitis: Secondary | ICD-10-CM | POA: Diagnosis not present

## 2023-10-13 DIAGNOSIS — Z87891 Personal history of nicotine dependence: Secondary | ICD-10-CM | POA: Diagnosis not present

## 2023-10-13 DIAGNOSIS — I700 Atherosclerosis of aorta: Secondary | ICD-10-CM | POA: Diagnosis not present

## 2023-10-13 DIAGNOSIS — A6000 Herpesviral infection of urogenital system, unspecified: Secondary | ICD-10-CM | POA: Diagnosis not present

## 2023-10-13 DIAGNOSIS — K760 Fatty (change of) liver, not elsewhere classified: Secondary | ICD-10-CM | POA: Diagnosis not present

## 2023-10-13 DIAGNOSIS — C61 Malignant neoplasm of prostate: Secondary | ICD-10-CM | POA: Diagnosis not present

## 2023-10-13 DIAGNOSIS — Z01818 Encounter for other preprocedural examination: Secondary | ICD-10-CM | POA: Diagnosis not present

## 2023-10-13 DIAGNOSIS — D72829 Elevated white blood cell count, unspecified: Secondary | ICD-10-CM | POA: Diagnosis not present

## 2023-10-13 DIAGNOSIS — E119 Type 2 diabetes mellitus without complications: Secondary | ICD-10-CM | POA: Diagnosis not present

## 2023-10-13 DIAGNOSIS — D329 Benign neoplasm of meninges, unspecified: Secondary | ICD-10-CM | POA: Diagnosis not present

## 2023-10-13 DIAGNOSIS — I10 Essential (primary) hypertension: Secondary | ICD-10-CM | POA: Diagnosis not present

## 2023-10-13 DIAGNOSIS — I7121 Aneurysm of the ascending aorta, without rupture: Secondary | ICD-10-CM | POA: Diagnosis not present

## 2023-10-13 DIAGNOSIS — K449 Diaphragmatic hernia without obstruction or gangrene: Secondary | ICD-10-CM | POA: Diagnosis not present

## 2023-10-13 DIAGNOSIS — M51369 Other intervertebral disc degeneration, lumbar region without mention of lumbar back pain or lower extremity pain: Secondary | ICD-10-CM | POA: Diagnosis not present

## 2023-10-14 ENCOUNTER — Other Ambulatory Visit: Payer: Self-pay

## 2023-10-14 ENCOUNTER — Emergency Department

## 2023-10-14 ENCOUNTER — Inpatient Hospital Stay
Admission: EM | Admit: 2023-10-14 | Discharge: 2023-10-18 | DRG: 853 | Disposition: A | Discharge status: Home Health | Attending: Internal Medicine | Admitting: Internal Medicine

## 2023-10-14 ENCOUNTER — Encounter: Payer: Self-pay | Admitting: Internal Medicine

## 2023-10-14 DIAGNOSIS — R4182 Altered mental status, unspecified: Secondary | ICD-10-CM | POA: Diagnosis not present

## 2023-10-14 DIAGNOSIS — R410 Disorientation, unspecified: Secondary | ICD-10-CM | POA: Diagnosis not present

## 2023-10-14 DIAGNOSIS — Z1152 Encounter for screening for COVID-19: Secondary | ICD-10-CM | POA: Diagnosis not present

## 2023-10-14 DIAGNOSIS — E119 Type 2 diabetes mellitus without complications: Secondary | ICD-10-CM | POA: Diagnosis not present

## 2023-10-14 DIAGNOSIS — R7881 Bacteremia: Secondary | ICD-10-CM | POA: Diagnosis present

## 2023-10-14 DIAGNOSIS — I441 Atrioventricular block, second degree: Secondary | ICD-10-CM | POA: Diagnosis present

## 2023-10-14 DIAGNOSIS — N1330 Unspecified hydronephrosis: Secondary | ICD-10-CM | POA: Diagnosis not present

## 2023-10-14 DIAGNOSIS — E66812 Obesity, class 2: Secondary | ICD-10-CM | POA: Diagnosis not present

## 2023-10-14 DIAGNOSIS — R000 Tachycardia, unspecified: Secondary | ICD-10-CM | POA: Diagnosis not present

## 2023-10-14 DIAGNOSIS — I7121 Aneurysm of the ascending aorta, without rupture: Secondary | ICD-10-CM | POA: Diagnosis not present

## 2023-10-14 DIAGNOSIS — G936 Cerebral edema: Secondary | ICD-10-CM | POA: Diagnosis present

## 2023-10-14 DIAGNOSIS — I712 Thoracic aortic aneurysm, without rupture, unspecified: Secondary | ICD-10-CM | POA: Diagnosis present

## 2023-10-14 DIAGNOSIS — N390 Urinary tract infection, site not specified: Secondary | ICD-10-CM | POA: Diagnosis not present

## 2023-10-14 DIAGNOSIS — J9811 Atelectasis: Secondary | ICD-10-CM | POA: Diagnosis not present

## 2023-10-14 DIAGNOSIS — Z79899 Other long term (current) drug therapy: Secondary | ICD-10-CM

## 2023-10-14 DIAGNOSIS — I517 Cardiomegaly: Secondary | ICD-10-CM | POA: Diagnosis not present

## 2023-10-14 DIAGNOSIS — N136 Pyonephrosis: Secondary | ICD-10-CM | POA: Diagnosis present

## 2023-10-14 DIAGNOSIS — A4159 Other Gram-negative sepsis: Principal | ICD-10-CM | POA: Diagnosis present

## 2023-10-14 DIAGNOSIS — D329 Benign neoplasm of meninges, unspecified: Secondary | ICD-10-CM | POA: Diagnosis not present

## 2023-10-14 DIAGNOSIS — Z85820 Personal history of malignant melanoma of skin: Secondary | ICD-10-CM

## 2023-10-14 DIAGNOSIS — N132 Hydronephrosis with renal and ureteral calculous obstruction: Secondary | ICD-10-CM | POA: Diagnosis not present

## 2023-10-14 DIAGNOSIS — E669 Obesity, unspecified: Secondary | ICD-10-CM | POA: Diagnosis not present

## 2023-10-14 DIAGNOSIS — I5032 Chronic diastolic (congestive) heart failure: Secondary | ICD-10-CM | POA: Diagnosis not present

## 2023-10-14 DIAGNOSIS — E785 Hyperlipidemia, unspecified: Secondary | ICD-10-CM | POA: Diagnosis not present

## 2023-10-14 DIAGNOSIS — I2489 Other forms of acute ischemic heart disease: Secondary | ICD-10-CM | POA: Diagnosis present

## 2023-10-14 DIAGNOSIS — F0390 Unspecified dementia without behavioral disturbance: Secondary | ICD-10-CM | POA: Diagnosis not present

## 2023-10-14 DIAGNOSIS — I10 Essential (primary) hypertension: Secondary | ICD-10-CM | POA: Diagnosis not present

## 2023-10-14 DIAGNOSIS — Z6835 Body mass index (BMI) 35.0-35.9, adult: Secondary | ICD-10-CM | POA: Diagnosis not present

## 2023-10-14 DIAGNOSIS — G3184 Mild cognitive impairment, so stated: Secondary | ICD-10-CM | POA: Diagnosis not present

## 2023-10-14 DIAGNOSIS — R6520 Severe sepsis without septic shock: Secondary | ICD-10-CM | POA: Diagnosis not present

## 2023-10-14 DIAGNOSIS — A419 Sepsis, unspecified organism: Secondary | ICD-10-CM | POA: Diagnosis present

## 2023-10-14 DIAGNOSIS — I2510 Atherosclerotic heart disease of native coronary artery without angina pectoris: Secondary | ICD-10-CM | POA: Diagnosis not present

## 2023-10-14 DIAGNOSIS — Z9841 Cataract extraction status, right eye: Secondary | ICD-10-CM

## 2023-10-14 DIAGNOSIS — G9341 Metabolic encephalopathy: Secondary | ICD-10-CM | POA: Diagnosis not present

## 2023-10-14 DIAGNOSIS — I110 Hypertensive heart disease with heart failure: Secondary | ICD-10-CM | POA: Diagnosis present

## 2023-10-14 DIAGNOSIS — N201 Calculus of ureter: Secondary | ICD-10-CM | POA: Diagnosis not present

## 2023-10-14 DIAGNOSIS — I440 Atrioventricular block, first degree: Secondary | ICD-10-CM | POA: Diagnosis not present

## 2023-10-14 DIAGNOSIS — Z85828 Personal history of other malignant neoplasm of skin: Secondary | ICD-10-CM | POA: Diagnosis not present

## 2023-10-14 DIAGNOSIS — G4733 Obstructive sleep apnea (adult) (pediatric): Secondary | ICD-10-CM | POA: Diagnosis present

## 2023-10-14 DIAGNOSIS — Z9842 Cataract extraction status, left eye: Secondary | ICD-10-CM

## 2023-10-14 DIAGNOSIS — F17210 Nicotine dependence, cigarettes, uncomplicated: Secondary | ICD-10-CM | POA: Diagnosis present

## 2023-10-14 DIAGNOSIS — I4891 Unspecified atrial fibrillation: Secondary | ICD-10-CM | POA: Diagnosis not present

## 2023-10-14 DIAGNOSIS — B964 Proteus (mirabilis) (morganii) as the cause of diseases classified elsewhere: Secondary | ICD-10-CM | POA: Diagnosis not present

## 2023-10-14 DIAGNOSIS — K219 Gastro-esophageal reflux disease without esophagitis: Secondary | ICD-10-CM | POA: Diagnosis present

## 2023-10-14 DIAGNOSIS — K862 Cyst of pancreas: Secondary | ICD-10-CM | POA: Diagnosis not present

## 2023-10-14 DIAGNOSIS — I5A Non-ischemic myocardial injury (non-traumatic): Secondary | ICD-10-CM | POA: Diagnosis not present

## 2023-10-14 DIAGNOSIS — Z7982 Long term (current) use of aspirin: Secondary | ICD-10-CM

## 2023-10-14 DIAGNOSIS — N139 Obstructive and reflux uropathy, unspecified: Secondary | ICD-10-CM | POA: Diagnosis not present

## 2023-10-14 DIAGNOSIS — R509 Fever, unspecified: Secondary | ICD-10-CM | POA: Diagnosis not present

## 2023-10-14 DIAGNOSIS — I4510 Unspecified right bundle-branch block: Secondary | ICD-10-CM | POA: Diagnosis present

## 2023-10-14 DIAGNOSIS — Z961 Presence of intraocular lens: Secondary | ICD-10-CM | POA: Diagnosis present

## 2023-10-14 DIAGNOSIS — I700 Atherosclerosis of aorta: Secondary | ICD-10-CM | POA: Diagnosis not present

## 2023-10-14 DIAGNOSIS — G4730 Sleep apnea, unspecified: Secondary | ICD-10-CM | POA: Diagnosis present

## 2023-10-14 DIAGNOSIS — R079 Chest pain, unspecified: Secondary | ICD-10-CM | POA: Diagnosis not present

## 2023-10-14 DIAGNOSIS — Z87442 Personal history of urinary calculi: Secondary | ICD-10-CM

## 2023-10-14 DIAGNOSIS — F17200 Nicotine dependence, unspecified, uncomplicated: Secondary | ICD-10-CM | POA: Diagnosis present

## 2023-10-14 DIAGNOSIS — I471 Supraventricular tachycardia, unspecified: Secondary | ICD-10-CM | POA: Diagnosis not present

## 2023-10-14 DIAGNOSIS — R001 Bradycardia, unspecified: Secondary | ICD-10-CM | POA: Diagnosis not present

## 2023-10-14 DIAGNOSIS — Z8546 Personal history of malignant neoplasm of prostate: Secondary | ICD-10-CM

## 2023-10-14 DIAGNOSIS — K7689 Other specified diseases of liver: Secondary | ICD-10-CM | POA: Diagnosis not present

## 2023-10-14 HISTORY — DX: Type 2 diabetes mellitus without complications: E11.9

## 2023-10-14 HISTORY — DX: Hyperlipidemia, unspecified: E78.5

## 2023-10-14 LAB — COMPREHENSIVE METABOLIC PANEL WITH GFR
ALT: 29 U/L (ref 0–44)
AST: 26 U/L (ref 15–41)
Albumin: 4.2 g/dL (ref 3.5–5.0)
Alkaline Phosphatase: 55 U/L (ref 38–126)
Anion gap: 14 (ref 5–15)
BUN: 25 mg/dL — ABNORMAL HIGH (ref 8–23)
CO2: 23 mmol/L (ref 22–32)
Calcium: 9.9 mg/dL (ref 8.9–10.3)
Chloride: 100 mmol/L (ref 98–111)
Creatinine, Ser: 0.92 mg/dL (ref 0.61–1.24)
GFR, Estimated: >60 mL/min (ref >60)
Glucose, Bld: 132 mg/dL — ABNORMAL HIGH (ref 70–99)
Potassium: 3.8 mmol/L (ref 3.5–5.1)
Sodium: 137 mmol/L (ref 135–145)
Total Bilirubin: 1 mg/dL (ref 0.0–1.2)
Total Protein: 8.2 g/dL — ABNORMAL HIGH (ref 6.5–8.1)

## 2023-10-14 LAB — CBC WITH DIFFERENTIAL/PLATELET
Abs Immature Granulocytes: 0.29 K/uL — ABNORMAL HIGH (ref 0.00–0.07)
Basophils Absolute: 0.1 K/uL (ref 0.0–0.1)
Basophils Relative: 1 %
Eosinophils Absolute: 0 K/uL (ref 0.0–0.5)
Eosinophils Relative: 0 %
HCT: 44.1 % (ref 39.0–52.0)
Hemoglobin: 14.8 g/dL (ref 13.0–17.0)
Immature Granulocytes: 2 %
Lymphocytes Relative: 10 %
Lymphs Abs: 1.4 K/uL (ref 0.7–4.0)
MCH: 33.2 pg (ref 26.0–34.0)
MCHC: 33.6 g/dL (ref 30.0–36.0)
MCV: 98.9 fL (ref 80.0–100.0)
Monocytes Absolute: 1.3 K/uL — ABNORMAL HIGH (ref 0.1–1.0)
Monocytes Relative: 9 %
Neutro Abs: 12 K/uL — ABNORMAL HIGH (ref 1.7–7.7)
Neutrophils Relative %: 78 %
Platelets: 276 K/uL (ref 150–400)
RBC: 4.46 MIL/uL (ref 4.22–5.81)
RDW: 13 % (ref 11.5–15.5)
WBC: 15.2 K/uL — ABNORMAL HIGH (ref 4.0–10.5)
nRBC: 0 % (ref 0.0–0.2)

## 2023-10-14 LAB — URINALYSIS, W/ REFLEX TO CULTURE (INFECTION SUSPECTED)
Bilirubin Urine: NEGATIVE
Glucose, UA: NEGATIVE mg/dL
Ketones, ur: NEGATIVE mg/dL
Nitrite: NEGATIVE
Protein, ur: 30 mg/dL — AB
Specific Gravity, Urine: 1.016 (ref 1.005–1.030)
Squamous Epithelial / HPF: 0 /HPF (ref 0–5)
WBC, UA: >50 WBC/hpf (ref 0–5)
pH: 8 (ref 5.0–8.0)

## 2023-10-14 LAB — CBG MONITORING, ED: Glucose-Capillary: 138 mg/dL — ABNORMAL HIGH (ref 70–99)

## 2023-10-14 LAB — TROPONIN I (HIGH SENSITIVITY)
Troponin I (High Sensitivity): 18 ng/L — ABNORMAL HIGH (ref <18)
Troponin I (High Sensitivity): 18 ng/L — ABNORMAL HIGH (ref <18)

## 2023-10-14 LAB — LACTIC ACID, PLASMA
Lactic Acid, Venous: 3.1 mmol/L (ref 0.5–1.9)
Lactic Acid, Venous: 2.5 mmol/L (ref 0.5–1.9)

## 2023-10-14 LAB — RESP PANEL BY RT-PCR (RSV, FLU A&B, COVID)  RVPGX2
Influenza A by PCR: NEGATIVE
Influenza B by PCR: NEGATIVE
Resp Syncytial Virus by PCR: NEGATIVE
SARS Coronavirus 2 by RT PCR: NEGATIVE

## 2023-10-14 MED ORDER — ACETAMINOPHEN 325 MG PO TABS: 650.0000 mg | ORAL_TABLET | Freq: Four times a day (QID) | ORAL | Status: DC | PRN

## 2023-10-14 MED ORDER — VANCOMYCIN HCL IN DEXTROSE 1-5 GM/200ML-% IV SOLN
1000.0000 mg | Freq: Once | INTRAVENOUS | Status: AC
  Administered 2023-10-14: 1000 mg via INTRAVENOUS
  Filled 2023-10-14: qty 200

## 2023-10-14 MED ORDER — NICOTINE 21 MG/24HR TD PT24
21.0000 mg | MEDICATED_PATCH | Freq: Every day | TRANSDERMAL | Status: DC
  Filled 2023-10-14 (×3): qty 1

## 2023-10-14 MED ORDER — HYDRALAZINE HCL 20 MG/ML IJ SOLN: 5.0000 mg | INTRAMUSCULAR | Status: DC | PRN

## 2023-10-14 MED ORDER — ACETAMINOPHEN 500 MG PO TABS
1000.0000 mg | ORAL_TABLET | Freq: Once | ORAL | Status: AC
  Administered 2023-10-14: 1000 mg via ORAL
  Filled 2023-10-14: qty 2

## 2023-10-14 MED ORDER — LACTATED RINGERS IV SOLN: INTRAVENOUS | Status: AC

## 2023-10-14 MED ORDER — SODIUM CHLORIDE 0.9 % IV SOLN
2.0000 g | Freq: Once | INTRAVENOUS | Status: AC
  Administered 2023-10-14: 2 g via INTRAVENOUS
  Filled 2023-10-14: qty 12.5

## 2023-10-14 MED ORDER — LACTATED RINGERS IV BOLUS (SEPSIS)
1000.0000 mL | Freq: Once | INTRAVENOUS | Status: AC
  Administered 2023-10-14: 1000 mL via INTRAVENOUS

## 2023-10-14 MED ORDER — ONDANSETRON HCL 4 MG/2ML IJ SOLN: 4.0000 mg | Freq: Three times a day (TID) | INTRAMUSCULAR | Status: DC | PRN

## 2023-10-14 MED ORDER — HEPARIN SODIUM (PORCINE) 5000 UNIT/ML IJ SOLN
5000.0000 [IU] | Freq: Three times a day (TID) | INTRAMUSCULAR | Status: DC
  Administered 2023-10-15 – 2023-10-18 (×11): 5000 [IU] via SUBCUTANEOUS
  Filled 2023-10-14 (×11): qty 1

## 2023-10-14 MED ORDER — SODIUM CHLORIDE 0.9 % IV SOLN
2.0000 g | INTRAVENOUS | Status: DC
  Administered 2023-10-15 – 2023-10-18 (×4): 2 g via INTRAVENOUS
  Filled 2023-10-14 (×5): qty 20

## 2023-10-14 NOTE — H&P (Incomplete)
 History and Physical    Jason Washington FMW:[id number] DOB: [birth year] DOA: 10/14/2023  Referring MD/NP/PA:   PCP: Franchot Houston, PA-C   Patient coming from:  The patient is coming from home.     Chief Complaint: Confusion  HPI: Jason Washington is a 78 y.o. male with medical history significant of right ureteral stone with hydronephrosis (possibly passed stone), HTN, HLD, DM, dCHF, dementia, prostate cancer, obesity, smoker, who presents with confusion.   a 78 y.o. male with a history of hypertension, hyperlipidemia, type 2 diabetes, and meningioma who presents with altered mental status that developed relatively acutely today.  The wife reports that the patient was walking somewhat awkwardly yesterday.  Today he did not eat breakfast or lunch and slept most of the day.  He often sleeps late, but this was unusual for him.  Subsequently he left the house to go to [facility] but briefly could not figure out how to put his truck in reverse.  The wife became worried about him and tried to call him, but he was not picking up.  She went to go find him, and found him in his truck in the parking lot unable to get out of it.  He appeared very confused.  She thought he might be having a stroke.  He was unable to walk on his own at that time.  The patient himself is unable to give any history.        Data reviewed independently and ED Course: pt was found to have WBC 15.2, lactic acid 3.1 --> 2.5, UA (hazy appearance, large amount of leukocyte, rare bacteria, WBC> 50), GFR> 60, troponin 18 --> 18.  Temperature 103- > 98.4, blood pressure 142/72, heart rate of 114 --> 84, RR 22, oxygen  saturation 95% on room air.  Chest x-ray negative.  Patient is admitted to telemetry bed as inpatient.   EKG: I have personally reviewed.  Sinus rhythm, QTc 454, right bundle blockade, low voltage.   Review of Systems:   General: no fevers, chills, no body weight gain, has poor appetite, has fatigue HEENT:  no blurry vision, hearing changes or sore throat Respiratory: no dyspnea, coughing, wheezing CV: no chest pain, no palpitations GI: no nausea, vomiting, abdominal pain, diarrhea, constipation GU: no dysuria, burning on urination, increased urinary frequency, hematuria  Ext: no leg edema Neuro: no unilateral weakness, numbness, or tingling, no vision change or hearing loss Skin: no rash, no skin tear. MSK: No muscle spasm, no deformity, no limitation of range of movement in spin Heme: No easy bruising.  Travel history: No recent long distant travel.   Allergy: No Known Allergies  Past Medical History:  Diagnosis Date  . Actinic keratosis   . Basal cell carcinoma 11/21/2019   right superior lat pectoral/ EDC  . Basal cell carcinoma 08/12/2022   Right sideburn. Mohs pending.  . Chronic GERD   . Diabetes mellitus without complication (HCC)   . Genital herpes   . Hepatic steatosis   . HLD (hyperlipidemia)   . Hypertension   . Imbalance   . Melanoma (HCC)    R knee several years ago   . Mild cognitive impairment with memory loss   . Neuropathy   . Prostate CA (HCC)   . Sleep apnea   . Squamous cell carcinoma of skin 12/22/2021   left forearm-anterior treated wiht EDC  . Thoracic aortic aneurysm [facility])     Past Surgical History:  Procedure Laterality Date  . APPENDECTOMY    .  CATARACT EXTRACTION W/PHACO Left 08/03/2022   Procedure: CATARACT EXTRACTION PHACO AND INTRAOCULAR LENS PLACEMENT (IOC) LEFT CLAREON PANOPTIX TORIC  9.11  00:47.4;  Surgeon: Jaye Fallow, MD;  Location: [facility];  Service: Ophthalmology;  Laterality: Left;  . CATARACT EXTRACTION W/PHACO Right 08/17/2022   Procedure: CATARACT EXTRACTION PHACO AND INTRAOCULAR LENS PLACEMENT (IOC) RIGHT CLAREON PANOPTIX TORIC;  Surgeon: Jaye Fallow, MD;  Location: [facility];  Service: Ophthalmology;  Laterality: Right;  14.56 1:18.0  . COLONOSCOPY WITH PROPOFOL  N/A 10/07/2021   Procedure:  COLONOSCOPY WITH PROPOFOL ;  Surgeon: Toledo, Ladell POUR, MD;  Location: [facility];  Service: Endoscopy;  Laterality: N/A;  . HERNIA REPAIR    . PROSTATE SURGERY    . TONSILLECTOMY      Social History:  reports that he has been smoking cigarettes. He has a 10 pack-year smoking history. He has never used smokeless tobacco. He reports current alcohol use. He reports that he does not use drugs.  Family History: No family history on file.   Prior to Admission medications   Medication Sig Start Date End Date Taking? Authorizing Provider  acyclovir (ZOVIRAX) 400 MG tablet Take 400 mg by mouth 2 (two) times daily. 01/04/19   [provider]  amLODipine  (NORVASC ) 10 MG tablet Take 1 tablet (10 mg total) by mouth daily. 05/24/23 05/23/24  Willo Dunnings, MD  aspirin EC 81 MG tablet Take 81 mg by mouth daily. Swallow whole.    [provider]  atorvastatin (LIPITOR) 40 MG tablet atorvastatin 40 mg tablet 04/13/18   [provider]  budesonide (ENTOCORT EC) 3 MG 24 hr capsule Take 9 mg by mouth daily.    [provider]  cetirizine (ZYRTEC) 10 MG tablet Take 10 mg by mouth daily. Freya ODESSIA Primrose    [provider]  donepezil (ARICEPT) 10 MG tablet Take 10 mg by mouth at bedtime.    [provider]  fluorouracil  (EFUDEX ) 5 % cream Apply to scalp twice a day x 10 days, then stop 08/23/23   Hester Alm BROCKS, MD  hydrochlorothiazide (HYDRODIURIL) 25 MG tablet hydrochlorothiazide 25 mg tablet 09/13/13   [provider]  ketoconazole  (NIZORAL ) 2 % cream Apply 1 Application topically at bedtime. Qhs to feet 01/13/22   Hester Alm BROCKS, MD  mupirocin  ointment (BACTROBAN ) 2 % Apply 1 Application topically daily. 02/22/22   Hester Alm BROCKS, MD  omeprazole (PRILOSEC) 40 MG capsule omeprazole 40 mg capsule,delayed release 09/04/13   [provider]  valsartan (DIOVAN) 80 MG tablet Take 80 mg by mouth daily.    [provider]     Physical Exam: Vitals:   10/14/23 2004 10/14/23 2006 10/14/23 2020 10/14/23 2300  BP: (!) 163/128 135/75  (!) 142/72  Pulse: (!) 114   84  Resp: 18   (!) 22  Temp: (!) 102.1 F (38.9 C)  (!) 103 F (39.4 C) 98.4 F (36.9 C)  TempSrc: Oral  Rectal Oral  SpO2: 96%   95%   General: Not in acute distress HEENT:       Eyes: PERRL, EOMI, no jaundice       ENT: No discharge from the ears and nose, no pharynx injection, no tonsillar enlargement.        Neck: No JVD, no bruit, no mass felt. Heme: No neck lymph node enlargement. Cardiac: S1/S2, RRR, No murmurs, No gallops or rubs. Respiratory: No rales, wheezing, rhonchi or rubs. GI: Soft, nondistended, nontender, no rebound pain, no organomegaly, BS  present. GU: No hematuria Ext: No pitting leg edema bilaterally. 1+DP/PT pulse bilaterally. Musculoskeletal: No joint deformities, No joint redness or warmth, no limitation of ROM in spin. Skin: No rashes.  Neuro: Alert, oriented X3, cranial nerves II-XII grossly intact, moves all extremities normally Psych: Patient is not psychotic, no suicidal or hemocidal ideation.  Labs on Admission: I have personally reviewed following labs and imaging studies  CBC: Recent Labs  Lab 10/14/23 2012  WBC 15.2*  NEUTROABS 12.0*  HGB 14.8  HCT 44.1  MCV 98.9  PLT 276   Basic Metabolic Panel: Recent Labs  Lab 10/14/23 2012  NA 137  K 3.8  CL 100  CO2 23  GLUCOSE 132*  BUN 25*  CREATININE 0.92  CALCIUM 9.9   GFR: CrCl cannot be calculated (Unknown ideal weight.). Liver Function Tests: Recent Labs  Lab 10/14/23 2012  AST 26  ALT 29  ALKPHOS 55  BILITOT 1.0  PROT 8.2*  ALBUMIN 4.2   No results for input(s): LIPASE, AMYLASE in the last 168 hours. No results for input(s): AMMONIA in the last 168 hours. Coagulation Profile: No results for input(s): INR, PROTIME in the last 168 hours. Cardiac Enzymes: No results for input(s): CKTOTAL, CKMB, CKMBINDEX,  TROPONINI in the last 168 hours. BNP (last 3 results) No results for input(s): PROBNP in the last 8760 hours. HbA1C: No results for input(s): HGBA1C in the last 72 hours. CBG: Recent Labs  Lab 10/14/23 2006  GLUCAP 138*   Lipid Profile: No results for input(s): CHOL, HDL, LDLCALC, TRIG, CHOLHDL, LDLDIRECT in the last 72 hours. Thyroid  Function Tests: No results for input(s): TSH, T4TOTAL, FREET4, T3FREE, THYROIDAB in the last 72 hours. Anemia Panel: No results for input(s): VITAMINB12, FOLATE, FERRITIN, TIBC, IRON, RETICCTPCT in the last 72 hours. Urine analysis:    Component Value Date/Time   COLORURINE YELLOW (A) 10/14/2023 2236   APPEARANCEUR HAZY (A) 10/14/2023 2236   LABSPEC 1.016 10/14/2023 2236   PHURINE 8.0 10/14/2023 2236   GLUCOSEU NEGATIVE 10/14/2023 2236   HGBUR SMALL (A) 10/14/2023 2236   BILIRUBINUR NEGATIVE 10/14/2023 2236   KETONESUR NEGATIVE 10/14/2023 2236   PROTEINUR 30 (A) 10/14/2023 2236   NITRITE NEGATIVE 10/14/2023 2236   LEUKOCYTESUR LARGE (A) 10/14/2023 2236   Sepsis Labs: @LABRCNTIP (procalcitonin:4,lacticidven:4) ) Recent Results (from the past 240 hours)  Resp panel by RT-PCR (RSV, Flu A&B, Covid) Anterior Nasal Swab     Status: None   Collection Time: 10/14/23  8:45 PM   Specimen: Anterior Nasal Swab  Result Value Ref Range Status   SARS Coronavirus 2 by RT PCR NEGATIVE NEGATIVE Final    Comment: (NOTE) SARS-CoV-2 target nucleic acids are NOT DETECTED.  The SARS-CoV-2 RNA is generally detectable in upper respiratory specimens during the acute phase of infection. The lowest concentration of SARS-CoV-2 viral copies this assay can detect is 138 copies/mL. A negative result does not preclude SARS-Cov-2 infection and should not be used as the sole basis for treatment or other patient management decisions. A negative result may occur with  improper specimen collection/handling, submission of specimen  other than nasopharyngeal swab, presence of viral mutation(s) within the areas targeted by this assay, and inadequate number of viral copies(<138 copies/mL). A negative result must be combined with clinical observations, patient history, and epidemiological information. The expected result is Negative.  Fact Sheet for Patients:  BloggerCourse.com  Fact Sheet for Healthcare Providers:  SeriousBroker.it  This test is no t yet approved or cleared by the [place]  FDA and  has been authorized for detection and/or diagnosis of SARS-CoV-2 by FDA under an Emergency Use Authorization (EUA). This EUA will remain  in effect (meaning this test can be used) for the duration of the COVID-19 declaration under Section 564(b)(1) of the Act, 21 U.S.C.section 360bbb-3(b)(1), unless the authorization is terminated  or revoked sooner.       Influenza A by PCR NEGATIVE NEGATIVE Final   Influenza B by PCR NEGATIVE NEGATIVE Final    Comment: (NOTE) The Xpert Xpress SARS-CoV-2/FLU/RSV plus assay is intended as an aid in the diagnosis of influenza from Nasopharyngeal swab specimens and should not be used as a sole basis for treatment. Nasal washings and aspirates are unacceptable for Xpert Xpress SARS-CoV-2/FLU/RSV testing.  Fact Sheet for Patients: BloggerCourse.com  Fact Sheet for Healthcare Providers: SeriousBroker.it  This test is not yet approved or cleared by the [place]  FDA and has been authorized for detection and/or diagnosis of SARS-CoV-2 by FDA under an Emergency Use Authorization (EUA). This EUA will remain in effect (meaning this test can be used) for the duration of the COVID-19 declaration under Section 564(b)(1) of the Act, 21 U.S.C. section 360bbb-3(b)(1), unless the authorization is terminated or revoked.     Resp Syncytial Virus by PCR NEGATIVE NEGATIVE Final     Comment: (NOTE) Fact Sheet for Patients: BloggerCourse.com  Fact Sheet for Healthcare Providers: SeriousBroker.it  This test is not yet approved or cleared by the [place]  FDA and has been authorized for detection and/or diagnosis of SARS-CoV-2 by FDA under an Emergency Use Authorization (EUA). This EUA will remain in effect (meaning this test can be used) for the duration of the COVID-19 declaration under Section 564(b)(1) of the Act, 21 U.S.C. section 360bbb-3(b)(1), unless the authorization is terminated or revoked.  Performed at [facility], [address] [place], [place] [address]      Radiological Exams on Admission:   Assessment/Plan Principal Problem:   Sepsis due to urinary tract infection [facility] Active Problems:   Acute metabolic encephalopathy   Hypertension   Chronic diastolic CHF (congestive heart failure) (HCC)   HLD (hyperlipidemia)   Diabetes mellitus without complication (HCC)   Myocardial injury   Mild cognitive impairment with memory loss   Sleep apnea   Obesity (BMI 30-39.9)   Assessment and Plan:     Sepsis due to urinary tract infection [facility] Patient meets criteria for severe sepsis with WBC 15.2, temperature 103, heart rate up to 114, RR 22.  Lactic acid 3.1 --> 2.5.  Currently hemodynamically stable.  -Admitted to telemetry bed as inpatient - Antibiotics: Rocephin (patient received 1 dose of vancomycin  and cefepime  in ED) - Follow-up of blood culture and urine culture - Trend troponin - IV fluid: 32 INR, the 100 cc/h  Acute metabolic encephalopathy and Confusion: Likely due to UTI.     Hypertension   Chronic diastolic CHF (congestive heart failure) (HCC)   HLD (hyperlipidemia)   Diabetes mellitus without complication (HCC)   Myocardial injury   Mild cognitive impairment with memory loss   Sleep apnea   Obesity (BMI 30-39.9)       DVT ppx: SQ Heparin          Code Status: Full code   Family Communication:     not done, no family member is at bed side.              Yes, patient's    at bed side.       by phone   ***  Disposition Plan:  Anticipate discharge back to previous environment  Consults called:    Admission status and Level of care: Telemetry Medical:  as inpt        Dispo: The patient is from: Home              Anticipated d/c is to: Home              Anticipated d/c date is: 2 days              Patient currently is not medically stable to d/c.    Severity of Illness:  The appropriate patient status for this patient is INPATIENT. Inpatient status is judged to be reasonable and necessary in order to provide the required intensity of service to ensure the patient's safety. The patient's presenting symptoms, physical exam findings, and initial radiographic and laboratory data in the context of their chronic comorbidities is felt to place them at high risk for further clinical deterioration. Furthermore, it is not anticipated that the patient will be medically stable for discharge from the hospital within 2 midnights of admission.   * I certify that at the point of admission it is my clinical judgment that the patient will require inpatient hospital care spanning beyond 2 midnights from the point of admission due to high intensity of service, high risk for further deterioration and high frequency of surveillance required.*       Date of Service 10/15/2023    Caleb Exon [facility]   If 7PM-7AM, please contact night-coverage [email or web address] 10/15/2023, 12:02 AM

## 2023-10-14 NOTE — ED Triage Notes (Addendum)
 Family noted pt walking awkwardly yesterday. Today drove himself  to [facility] and he could not get out of the truck, found lethargic in the parking Lot. Alert and confused. No Hx of CVA. Hx of Meningioma. Febrile with confusion in triage. LKW 5PM

## 2023-10-14 NOTE — Sepsis Progress Note (Signed)
 Elink monitoring for the code sepsis protocol.

## 2023-10-14 NOTE — ED Notes (Signed)
 Critical called by Ila in the lab. Lactic 3.1. Dr Jacolyn.

## 2023-10-14 NOTE — ED Provider Notes (Signed)
 [facility] Provider Note    Event Date/Time   First MD Initiated Contact with Patient 10/14/23 2013     (approximate)   History   Code Stroke, Altered Mental Status, and Code Sepsis   HPI  Jason Washington is a 78 y.o. male with a history of hypertension, hyperlipidemia, type 2 diabetes, and meningioma who presents with altered mental status that developed relatively acutely today.  The wife reports that the patient was walking somewhat awkwardly yesterday.  Today he did not eat breakfast or lunch and slept most of the day.  He often sleeps late, but this was unusual for him.  Subsequently he left the house to go to [facility] but briefly could not figure out how to put his truck in reverse.  The wife became worried about him and tried to call him, but he was not picking up.  She went to go find him, and found him in his truck in the parking lot unable to get out of it.  He appeared very confused.  She thought he might be having a stroke.  He was unable to walk on his own at that time.  The patient himself is unable to give any history.  I reviewed the past medical records.  He was admitted to the hospitalist service in June with acute encephalopathy thought to be delirium related to pain from a ureteral stone as well as related to his meningioma.  He is scheduled for a resection of his meningioma next week.   Physical Exam   Triage Vital Signs: ED Triage Vitals [10/14/23 2004]  Encounter Vitals Group     BP (!) 163/128     Girls Systolic BP Percentile      Girls Diastolic BP Percentile      Boys Systolic BP Percentile      Boys Diastolic BP Percentile      Pulse Rate (!) 114     Resp 18     Temp (!) 102.1 F (38.9 C)     Temp Source Oral     SpO2 96 %     Weight      Height      Head Circumference      Peak Flow      Pain Score      Pain Loc      Pain Education      Exclude from Growth Chart     Most recent vital signs: Vitals:   10/14/23  2020 10/14/23 2300  BP:  (!) 142/72  Pulse:  84  Resp:  (!) 22  Temp: (!) 103 F (39.4 C) 98.4 F (36.9 C)  SpO2:  95%     General: Alert, confused, oriented x 1, no distress.  CV:  Good peripheral perfusion.  Tachycardic, regular rhythm. Resp:  Normal effort.  Lungs CTAB. Abd:  Soft and nontender.  No distention.  Other:  Dry mucous membranes.  EOMI.  PERRLA.  No photophobia.  Neck supple, full ROM.  No facial droop.  Normal speech.  Motor intact in all extremities.  No pronator drift.  No ataxia.  No significant peripheral edema.   ED Results / Procedures / Treatments   Labs (all labs ordered are listed, but only abnormal results are displayed) Labs Reviewed  CBC WITH DIFFERENTIAL/PLATELET - Abnormal; Notable for the following components:      Result Value   WBC 15.2 (*)    Neutro Abs 12.0 (*)    Monocytes  Absolute 1.3 (*)    Abs Immature Granulocytes 0.29 (*)    All other components within normal limits  LACTIC ACID, PLASMA - Abnormal; Notable for the following components:   Lactic Acid, Venous 3.1 (*)    All other components within normal limits  LACTIC ACID, PLASMA - Abnormal; Notable for the following components:   Lactic Acid, Venous 2.5 (*)    All other components within normal limits  COMPREHENSIVE METABOLIC PANEL WITH GFR - Abnormal; Notable for the following components:   Glucose, Bld 132 (*)    BUN 25 (*)    Total Protein 8.2 (*)    All other components within normal limits  URINALYSIS, W/ REFLEX TO CULTURE (INFECTION SUSPECTED) - Abnormal; Notable for the following components:   Color, Urine YELLOW (*)    APPearance HAZY (*)    Hgb urine dipstick SMALL (*)    Protein, ur 30 (*)    Leukocytes,Ua LARGE (*)    Bacteria, UA RARE (*)    All other components within normal limits  CBG MONITORING, ED - Abnormal; Notable for the following components:   Glucose-Capillary 138 (*)    All other components within normal limits  TROPONIN I (HIGH SENSITIVITY) -  Abnormal; Notable for the following components:   Troponin I (High Sensitivity) 18 (*)    All other components within normal limits  TROPONIN I (HIGH SENSITIVITY) - Abnormal; Notable for the following components:   Troponin I (High Sensitivity) 18 (*)    All other components within normal limits  RESP PANEL BY RT-PCR (RSV, FLU A&B, COVID)  RVPGX2  CULTURE, BLOOD (ROUTINE X 2)  CULTURE, BLOOD (ROUTINE X 2)  URINE CULTURE     EKG  ED ECG REPORT I, Waylon Cassis, the attending physician, personally viewed and interpreted this ECG.  Date: 10/14/2023 EKG Time: 2005 Rate: 115 Rhythm: Sinus tachycardia QRS Axis: normal Intervals: RBBB ST/T Wave abnormalities: Nonspecific T wave abnormality Narrative Interpretation: no evidence of acute ischemia    RADIOLOGY  Chest x-ray: I independently viewed and interpreted the images; there is bilateral atelectasis with no focal consolidation or edema  CT head:  IMPRESSION:  1. Stable appearance of densely calcified mass in the left middle cranial fossa  with adjacent mass effect and vasogenic edema, and 5 mm rightward midline shift  of the lateral ventricles.  2. No acute intracranial abnormality.     PROCEDURES:  Critical Care performed: Yes, see critical care procedure note(s)  .Critical Care  Performed by: Cassis Waylon, MD Authorized by: Cassis Waylon, MD   Critical care provider statement:    Critical care time (minutes):  30   Critical care time was exclusive of:  Separately billable procedures and treating other patients   Critical care was necessary to treat or prevent imminent or life-threatening deterioration of the following conditions:  CNS failure or compromise and sepsis   Critical care was time spent personally by me on the following activities:  Development of treatment plan with patient or surrogate, discussions with consultants, evaluation of patient's response to treatment, examination of  patient, ordering and review of laboratory studies, ordering and review of radiographic studies, ordering and performing treatments and interventions, pulse oximetry, re-evaluation of patient's condition, review of old charts and obtaining history from patient or surrogate   Care discussed with: admitting provider      MEDICATIONS ORDERED IN ED: Medications  lactated ringers  infusion (has no administration in time range)  ondansetron  (ZOFRAN ) injection 4 mg (has no administration  in time range)  hydrALAZINE  (APRESOLINE ) injection 5 mg (has no administration in time range)  acetaminophen  (TYLENOL ) tablet 650 mg (has no administration in time range)  acetaminophen  (TYLENOL ) tablet 1,000 mg (1,000 mg Oral Given 10/14/23 2032)  lactated ringers  bolus 1,000 mL (0 mLs Intravenous Stopped 10/14/23 2235)  ceFEPIme  (MAXIPIME ) 2 g in sodium chloride  0.9 % 100 mL IVPB (0 g Intravenous Stopped 10/14/23 2128)  vancomycin  (VANCOCIN ) IVPB 1000 mg/200 mL premix (0 mg Intravenous Stopped 10/14/23 2237)  lactated ringers  bolus 1,000 mL (0 mLs Intravenous Stopped 10/14/23 2236)    And  lactated ringers  bolus 1,000 mL (0 mLs Intravenous Stopped 10/14/23 2342)     IMPRESSION / MDM / ASSESSMENT AND PLAN / ED COURSE  I reviewed the triage vital signs and the nursing notes.  78 year old male with PMH as noted above presents with relatively acute onset of altered mental status this afternoon.  On exam he is febrile and tachycardic with otherwise normal vital signs.  Thorough neurologic exam reveals no focal deficits although he is globally altered.    Differential diagnosis includes, but is not limited to, acute infection/sepsis, possible UTI, pneumonia, viral syndrome.  I have a lower suspicion for meningitis or encephalitis.  I was asked by the triage RN whether I wanted to activate a code stroke.  I immediately went to evaluate the patient.  Although he had a relatively acute onset of altered mental status, given that  he appears globally altered, has no focal neuro deficits, and per further history from the wife did have signs of illness yesterday and earlier today, there is no evidence of an acute CVA at this time.  I have ordered IV fluids, empiric antibiotics per the sepsis protocol, lab workup, as well as a CT of the head.  Patient's presentation is most consistent with acute presentation with potential threat to life or bodily function.  The patient is on the cardiac monitor to evaluate for evidence of arrhythmia and/or significant heart rate changes.   ----------------------------------------- 11:50 PM on 10/14/2023 -----------------------------------------  CT head and chest x-ray are negative for acute findings.  Lab workup is significant for leukocytosis and elevated lactate.  UA is consistent with a UTI, which is the most likely etiology of the patient's sepsis.  On reassessment he is more alert and responding more appropriately.  He denies any headache or abdominal pain.  Empiric antibiotics and fluids have been given.  Vital signs are improved.  I consulted Dr. Hilma from the hospitalist service; based on our discussion he agrees to evaluate the patient for admission.   FINAL CLINICAL IMPRESSION(S) / ED DIAGNOSES   Final diagnoses:  Urinary tract infection without hematuria, site unspecified  Altered mental status, unspecified altered mental status type  Sepsis, due to unspecified organism, unspecified whether acute organ dysfunction present [facility])     Rx / DC Orders   ED Discharge Orders     None        Note:  This document was prepared using Dragon voice recognition software and may include unintentional dictation errors.    Jacolyn Pae, MD 10/14/23 2351

## 2023-10-14 NOTE — H&P (Addendum)
 History and Physical    Jason Washington FMW:[id number] DOB: 1945-05-30 DOA: 10/14/2023  Referring MD/NP/PA:   PCP: Franchot Houston, PA-C   Patient coming from:  The patient is coming from home.     Chief Complaint: Confusion  HPI: Jason Washington is a 78 y.o. male with medical history significant of 3 mm right ureteral stone with hydronephrosis (possibly dislodged into bladder 07/24/23 ), HTN, HLD, DM, dCHF, dementia, prostate cancer, obesity, smoker, meningioma, who presents with confusion.  Patient has AMS, and is unable to provide accurate medical history, therefore, most of the history is obtained by discussing the case with ED physician, per EMS report, and with the nursing staff.  Per EDP, his wife reported that pt did not eat breakfast, and  slept most of the day which is unusual for him.  Subsequently he left the house to go to [facility] but could not figure out how to put his truck in reverse.  The wife became worried about him and tried to call him, but he was not picking up.  wife went to find him, and found him in his truck in the parking lot unable to get out of it and is confused. He was unable to walk on his own at that time.   When I saw pt on the floor, he is confused, and knows his own name, not oriented to place and time.  Patient moves all extremities normally.  No facial droop or slurred speech.  No active respiratory distress, cough, SOB noted.  No active nausea, vomiting, diarrhea noted.  Patient does not seem to have abdominal pain or chest pain.  He denies flank pain.  He also denies symptoms of UTI which is are slightly not reliable due to confusion.  Patient has fever with temperature 103 in ED.   Data reviewed independently and ED Course: pt was found to have WBC 15.2, lactic acid 3.1 --> 2.5, UA (hazy appearance, large amount of leukocyte, rare bacteria, WBC> 50), GFR> 60, troponin 18 --> 18.  Temperature 103- > 98.4, blood pressure 142/72, heart rate of 114  --> 84, RR 22, oxygen  saturation 95% on room air.  Chest x-ray negative.  Patient is admitted to telemetry bed as inpatient.  CT of head today: 1. Stable appearance of densely calcified mass in the left middle cranial fossa with adjacent mass effect and vasogenic edema, and 5 mm rightward midline shift of the lateral ventricles. 2. No acute intracranial abnormality.   CT scan of head on /48/25 for comparison:  3.7 cm densely calcified mass along the left anterior clinoid process/sphenoid wing concerning for meningioma. There is likely an associated cystic component. Mass effect on the left frontal lobe with significant vasogenic edema noted.   Approximately 5 mm rightward midline shift.   Recommend contrast enhanced MRI brain for further evaluation.   No intracranial hemorrhage identified.   EKG: I have personally reviewed.  Sinus rhythm, QTc 454, right bundle blockade, low voltage.   Review of Systems: Could not be reviewed accurately due to confusion.    Allergy: No Known Allergies  Past Medical History:  Diagnosis Date   Actinic keratosis    Basal cell carcinoma 11/21/2019   right superior lat pectoral/ EDC   Basal cell carcinoma 08/12/2022   Right sideburn. Mohs pending.   Chronic GERD    Diabetes mellitus without complication (HCC)    Genital herpes    Hepatic steatosis    HLD (hyperlipidemia)    Hypertension  Imbalance    Melanoma (HCC)    R knee several years ago    Mild cognitive impairment with memory loss    Neuropathy    Prostate CA (HCC)    Sleep apnea    Squamous cell carcinoma of skin 12/22/2021   left forearm-anterior treated wiht [facility]   Thoracic aortic aneurysm [facility])     Past Surgical History:  Procedure Laterality Date   APPENDECTOMY     CATARACT EXTRACTION W/PHACO Left 08/03/2022   Procedure: CATARACT EXTRACTION PHACO AND INTRAOCULAR LENS PLACEMENT (IOC) LEFT CLAREON PANOPTIX TORIC  9.11  00:47.4;  Surgeon: Jaye Fallow, MD;  Location:  [facility];  Service: Ophthalmology;  Laterality: Left;   CATARACT EXTRACTION W/PHACO Right 08/17/2022   Procedure: CATARACT EXTRACTION PHACO AND INTRAOCULAR LENS PLACEMENT (IOC) RIGHT CLAREON PANOPTIX TORIC;  Surgeon: Jaye Fallow, MD;  Location: [facility];  Service: Ophthalmology;  Laterality: Right;  14.56 1:18.0   COLONOSCOPY WITH PROPOFOL  N/A 10/07/2021   Procedure: COLONOSCOPY WITH PROPOFOL ;  Surgeon: Toledo, Ladell POUR, MD;  Location: [facility];  Service: Endoscopy;  Laterality: N/A;   HERNIA REPAIR     PROSTATE SURGERY     TONSILLECTOMY      Social History:  reports that he has been smoking cigarettes. He has a 10 pack-year smoking history. He has never used smokeless tobacco. He reports current alcohol use. He reports that he does not use drugs.  Family History: No family history on file.  Could not be reviewed accurately due to confusion.  Prior to Admission medications   Medication Sig Start Date End Date Taking? Authorizing Provider  acyclovir (ZOVIRAX) 400 MG tablet Take 400 mg by mouth 2 (two) times daily. 01/04/19   [provider]  amLODipine  (NORVASC ) 10 MG tablet Take 1 tablet (10 mg total) by mouth daily. 05/24/23 05/23/24  Willo Dunnings, MD  aspirin  EC 81 MG tablet Take 81 mg by mouth daily. Swallow whole.    [provider]  atorvastatin  (LIPITOR) 40 MG tablet atorvastatin  40 mg tablet 04/13/18   [provider]  budesonide (ENTOCORT EC) 3 MG 24 hr capsule Take 9 mg by mouth daily.    [provider]  cetirizine (ZYRTEC) 10 MG tablet Take 10 mg by mouth daily. Freya ODESSIA Primrose    [provider]  donepezil  (ARICEPT ) 10 MG tablet Take 10 mg by mouth at bedtime.    [provider]  fluorouracil  (EFUDEX ) 5 % cream Apply to scalp twice a day x 10 days, then stop 08/23/23   Hester Alm BROCKS, MD  hydrochlorothiazide (HYDRODIURIL) 25 MG tablet hydrochlorothiazide 25 mg tablet 09/13/13   [provider]  ketoconazole  (NIZORAL ) 2 % cream Apply 1 Application topically at bedtime. Qhs to feet 01/13/22   Hester Alm BROCKS, MD  mupirocin  ointment (BACTROBAN ) 2 % Apply 1 Application topically daily. 02/22/22   Hester Alm BROCKS, MD  omeprazole (PRILOSEC) 40 MG capsule omeprazole 40 mg capsule,delayed release 09/04/13   [provider]  valsartan (DIOVAN) 80 MG tablet Take 80 mg by mouth daily.    [provider]    Physical Exam: Vitals:   10/14/23 2006 10/14/23 2020 10/14/23 2300 10/15/23 0027  BP: 135/75  (!) 142/72 (!) 142/61  Pulse:   84 (!) 102  Resp:   (!) 22 18  Temp:  (!) 103 F (39.4 C) 98.4 F (36.9 C) 99.7 F (37.6 C)  TempSrc:  Rectal Oral   SpO2:   95% 96%  General: Not in acute distress HEENT:       Eyes: PERRL, EOMI, no jaundice       ENT: No discharge from the ears and nose.       Neck: No JVD, no bruit, no mass felt. Heme: No neck lymph node enlargement. Cardiac: S1/S2, RRR, No murmurs, No gallops or rubs. Respiratory: No rales, wheezing, rhonchi or rubs. GI: Soft, nondistended, nontender, no organomegaly, BS present. GU: No hematuria Ext: No pitting leg edema bilaterally. 1+DP/PT pulse bilaterally. Musculoskeletal: No joint deformities, No joint redness or warmth, no limitation of ROM in spin. Skin: No rashes.  Neuro: Confused, knows his own name, not oriented to place and time.  Cranial nerves II-XII grossly intact, moves all extremities normally. Psych: Patient is not psychotic, no suicidal or hemocidal ideation.  Labs on Admission: I have personally reviewed following labs and imaging studies  CBC: Recent Labs  Lab 10/14/23 2012  WBC 15.2*  NEUTROABS 12.0*  HGB 14.8  HCT 44.1  MCV 98.9  PLT 276   Basic Metabolic Panel: Recent Labs  Lab 10/14/23 2012  NA 137  K 3.8  CL 100  CO2 23  GLUCOSE 132*  BUN 25*  CREATININE 0.92  CALCIUM  9.9   GFR: CrCl cannot be calculated (Unknown ideal weight.). Liver Function  Tests: Recent Labs  Lab 10/14/23 2012  AST 26  ALT 29  ALKPHOS 55  BILITOT 1.0  PROT 8.2*  ALBUMIN 4.2   No results for input(s): LIPASE, AMYLASE in the last 168 hours. No results for input(s): AMMONIA in the last 168 hours. Coagulation Profile: No results for input(s): INR, PROTIME in the last 168 hours. Cardiac Enzymes: No results for input(s): CKTOTAL, CKMB, CKMBINDEX, TROPONINI in the last 168 hours. BNP (last 3 results) No results for input(s): PROBNP in the last 8760 hours. HbA1C: No results for input(s): HGBA1C in the last 72 hours. CBG: Recent Labs  Lab 10/14/23 2006 10/15/23 0034  GLUCAP 138* 127*   Lipid Profile: No results for input(s): CHOL, HDL, LDLCALC, TRIG, CHOLHDL, LDLDIRECT in the last 72 hours. Thyroid  Function Tests: No results for input(s): TSH, T4TOTAL, FREET4, T3FREE, THYROIDAB in the last 72 hours. Anemia Panel: No results for input(s): VITAMINB12, FOLATE, FERRITIN, TIBC, IRON, RETICCTPCT in the last 72 hours. Urine analysis:    Component Value Date/Time   COLORURINE YELLOW (A) 10/14/2023 2236   APPEARANCEUR HAZY (A) 10/14/2023 2236   LABSPEC 1.016 10/14/2023 2236   PHURINE 8.0 10/14/2023 2236   GLUCOSEU NEGATIVE 10/14/2023 2236   HGBUR SMALL (A) 10/14/2023 2236   BILIRUBINUR NEGATIVE 10/14/2023 2236   KETONESUR NEGATIVE 10/14/2023 2236   PROTEINUR 30 (A) 10/14/2023 2236   NITRITE NEGATIVE 10/14/2023 2236   LEUKOCYTESUR LARGE (A) 10/14/2023 2236   Sepsis Labs: @LABRCNTIP (procalcitonin:4,lacticidven:4) ) Recent Results (from the past 240 hours)  Resp panel by RT-PCR (RSV, Flu A&B, Covid) Anterior Nasal Swab     Status: None   Collection Time: 10/14/23  8:45 PM   Specimen: Anterior Nasal Swab  Result Value Ref Range Status   SARS Coronavirus 2 by RT PCR NEGATIVE NEGATIVE Final    Comment: (NOTE) SARS-CoV-2 target nucleic acids are NOT DETECTED.  The SARS-CoV-2 RNA is generally  detectable in upper respiratory specimens during the acute phase of infection. The lowest concentration of SARS-CoV-2 viral copies this assay can detect is 138 copies/mL. A negative result does not preclude SARS-Cov-2 infection and should not be used as the sole basis for treatment or other patient management  decisions. A negative result may occur with  improper specimen collection/handling, submission of specimen other than nasopharyngeal swab, presence of viral mutation(s) within the areas targeted by this assay, and inadequate number of viral copies(<138 copies/mL). A negative result must be combined with clinical observations, patient history, and epidemiological information. The expected result is Negative.  Fact Sheet for Patients:  BloggerCourse.com  Fact Sheet for Healthcare Providers:  SeriousBroker.it  This test is no t yet approved or cleared by the [place]  FDA and  has been authorized for detection and/or diagnosis of SARS-CoV-2 by FDA under an Emergency Use Authorization (EUA). This EUA will remain  in effect (meaning this test can be used) for the duration of the COVID-19 declaration under Section 564(b)(1) of the Act, 21 U.S.C.section 360bbb-3(b)(1), unless the authorization is terminated  or revoked sooner.       Influenza A by PCR NEGATIVE NEGATIVE Final   Influenza B by PCR NEGATIVE NEGATIVE Final    Comment: (NOTE) The Xpert Xpress SARS-CoV-2/FLU/RSV plus assay is intended as an aid in the diagnosis of influenza from Nasopharyngeal swab specimens and should not be used as a sole basis for treatment. Nasal washings and aspirates are unacceptable for Xpert Xpress SARS-CoV-2/FLU/RSV testing.  Fact Sheet for Patients: BloggerCourse.com  Fact Sheet for Healthcare Providers: SeriousBroker.it  This test is not yet approved or cleared by the [place]  FDA  and has been authorized for detection and/or diagnosis of SARS-CoV-2 by FDA under an Emergency Use Authorization (EUA). This EUA will remain in effect (meaning this test can be used) for the duration of the COVID-19 declaration under Section 564(b)(1) of the Act, 21 U.S.C. section 360bbb-3(b)(1), unless the authorization is terminated or revoked.     Resp Syncytial Virus by PCR NEGATIVE NEGATIVE Final    Comment: (NOTE) Fact Sheet for Patients: BloggerCourse.com  Fact Sheet for Healthcare Providers: SeriousBroker.it  This test is not yet approved or cleared by the [place]  FDA and has been authorized for detection and/or diagnosis of SARS-CoV-2 by FDA under an Emergency Use Authorization (EUA). This EUA will remain in effect (meaning this test can be used) for the duration of the COVID-19 declaration under Section 564(b)(1) of the Act, 21 U.S.C. section 360bbb-3(b)(1), unless the authorization is terminated or revoked.  Performed at [facility], [address], [place], [place] [address]      Radiological Exams on Admission:   Assessment/Plan Principal Problem:   Sepsis due to urinary tract infection [facility]) Active Problems:   Acute metabolic encephalopathy   Meningioma (HCC)   Hypertension   Chronic diastolic CHF (congestive heart failure) (HCC)   HLD (hyperlipidemia)   Diabetes mellitus without complication (HCC)   Myocardial injury   Mild cognitive impairment with memory loss   Sleep apnea   Obesity (BMI 30-39.9)   Smoker   Assessment and Plan:   Sepsis due to urinary tract infection [facility]): Patient meets criteria for severe sepsis with WBC 15.2, temperature 103, heart rate up to 114, RR 22.  Lactic acid 3.1 --> 2.5.  Currently hemodynamically stable. Pt has history of 3mm right ureteral stone.  We need to rule out recurrent kidney stone.  -Admitted to telemetry bed as inpatient - Antibiotics:  Rocephin  (patient received 1 dose of vancomycin  and cefepime  in ED) - Follow-up of blood culture and urine culture - Trend troponin - IV fluid: 32 INR, the 100 cc/h - Follow-up CT-renal stone --> I have communicated with floor coverage PA, Penne Cone who will kindly  follow up CT scan results  Meningioma: Patient has history of known meningioma.  CT scan showed stable meningioma, with stable 5 mm rightward midline shift of the lateral ventricles. Still has some mass effect and vasogenic edema. -will start Decadron  4 mg twice daily   Acute metabolic encephalopathy:  Likely due to UTI.  He has history of meningioma, which is stable. - Fall precaution - Frequent neurocheck  Hypertension -Hold blood pressure medications since patient at high risk for developing hypotension due to sepsis - IV hydralazine  as needed  Chronic diastolic CHF (congestive heart failure) (HCC): 2D echo on 07/13/2023 showed EF> 55% with grade 1 diastolic dysfunction.  No leg edema or JVD.  CHF seem to be compensated. -Check BNP  HLD (hyperlipidemia) -Lipitor  Diabetes mellitus without complication [facility]): Recent A1c 6.2, well-controlled. -SSI  Myocardial injury: trop is minimally elevated 18 --> 18.  Likely demand ischemia. - Aspirin , Lipitor  Mild cognitive impairment with memory loss -Donepezil   Sleep apnea: -Hold CPAP due to confusion  Obesity (BMI 30-39.9): Patient has Obesity Class II, with body weight 116.6  Kg and BMI 35.80 kg/m2.  - Encourage losing weight - Exercise and healthy diet       DVT ppx: SQ Heparin         Code Status: Full code   Family Communication:     not done, no family member is at bed side.    Disposition Plan:  Anticipate discharge back to previous environment  Consults called:    Admission status and Level of care: Telemetry Medical:  as inpt        Dispo: The patient is from: Home              Anticipated d/c is to: Home              Anticipated d/c date  is: 2 days              Patient currently is not medically stable to d/c.    Severity of Illness:  The appropriate patient status for this patient is INPATIENT. Inpatient status is judged to be reasonable and necessary in order to provide the required intensity of service to ensure the patient's safety. The patient's presenting symptoms, physical exam findings, and initial radiographic and laboratory data in the context of their chronic comorbidities is felt to place them at high risk for further clinical deterioration. Furthermore, it is not anticipated that the patient will be medically stable for discharge from the hospital within 2 midnights of admission.   * I certify that at the point of admission it is my clinical judgment that the patient will require inpatient hospital care spanning beyond 2 midnights from the point of admission due to high intensity of service, high risk for further deterioration and high frequency of surveillance required.*       Date of Service 10/15/2023    Caleb Exon [facility]   If 7PM-7AM, please contact night-coverage [email or web address] 10/15/2023, 12:49 AM

## 2023-10-14 NOTE — ED Notes (Signed)
 Fall bundle put in to place at this time.

## 2023-10-14 NOTE — Progress Notes (Signed)
 CODE SEPSIS - PHARMACY COMMUNICATION  **Broad Spectrum Antibiotics should be administered within 1 hour of Sepsis diagnosis**  Time Code Sepsis Called/Page Received: 2027  Antibiotics Ordered: Cefepime  & vancomycin   Time of 1st antibiotic administration: 2054  Additional action taken by pharmacy: N/A  Jason Washington 10/14/2023  8:57 PM

## 2023-10-14 NOTE — ED Notes (Signed)
 Pt cleaned of urine incontinence at this time. Pt's gown changed and given a warm blanket. Call bell in reach. Pt has no further needs at this time.

## 2023-10-14 NOTE — ED Notes (Signed)
 Patient is resting comfortably. Pt has eyes closed. Chest rise and fall noted at this time. VS WNL.

## 2023-10-15 ENCOUNTER — Inpatient Hospital Stay

## 2023-10-15 ENCOUNTER — Encounter: Payer: Self-pay | Admitting: Internal Medicine

## 2023-10-15 DIAGNOSIS — I7121 Aneurysm of the ascending aorta, without rupture: Secondary | ICD-10-CM | POA: Diagnosis not present

## 2023-10-15 DIAGNOSIS — A419 Sepsis, unspecified organism: Secondary | ICD-10-CM | POA: Diagnosis not present

## 2023-10-15 DIAGNOSIS — N390 Urinary tract infection, site not specified: Secondary | ICD-10-CM | POA: Diagnosis not present

## 2023-10-15 DIAGNOSIS — R079 Chest pain, unspecified: Secondary | ICD-10-CM | POA: Diagnosis not present

## 2023-10-15 DIAGNOSIS — F17200 Nicotine dependence, unspecified, uncomplicated: Secondary | ICD-10-CM | POA: Diagnosis present

## 2023-10-15 DIAGNOSIS — I471 Supraventricular tachycardia, unspecified: Secondary | ICD-10-CM | POA: Diagnosis not present

## 2023-10-15 DIAGNOSIS — D329 Benign neoplasm of meninges, unspecified: Secondary | ICD-10-CM | POA: Diagnosis present

## 2023-10-15 LAB — BASIC METABOLIC PANEL WITH GFR
Anion gap: 10 (ref 5–15)
BUN: 16 mg/dL (ref 8–23)
CO2: 25 mmol/L (ref 22–32)
Calcium: 9 mg/dL (ref 8.9–10.3)
Chloride: 101 mmol/L (ref 98–111)
Creatinine, Ser: 0.8 mg/dL (ref 0.61–1.24)
GFR, Estimated: >60 mL/min (ref >60)
Glucose, Bld: 171 mg/dL — ABNORMAL HIGH (ref 70–99)
Potassium: 3.6 mmol/L (ref 3.5–5.1)
Sodium: 136 mmol/L (ref 135–145)

## 2023-10-15 LAB — CBC
HCT: 38.3 % — ABNORMAL LOW (ref 39.0–52.0)
Hemoglobin: 13.2 g/dL (ref 13.0–17.0)
MCH: 33.5 pg (ref 26.0–34.0)
MCHC: 34.5 g/dL (ref 30.0–36.0)
MCV: 97.2 fL (ref 80.0–100.0)
Platelets: 217 K/uL (ref 150–400)
RBC: 3.94 MIL/uL — ABNORMAL LOW (ref 4.22–5.81)
RDW: 12.9 % (ref 11.5–15.5)
WBC: 15.3 K/uL — ABNORMAL HIGH (ref 4.0–10.5)
nRBC: 0 % (ref 0.0–0.2)

## 2023-10-15 LAB — GLUCOSE, CAPILLARY
Glucose-Capillary: 127 mg/dL — ABNORMAL HIGH (ref 70–99)
Glucose-Capillary: 156 mg/dL — ABNORMAL HIGH (ref 70–99)
Glucose-Capillary: 173 mg/dL — ABNORMAL HIGH (ref 70–99)
Glucose-Capillary: 220 mg/dL — ABNORMAL HIGH (ref 70–99)

## 2023-10-15 LAB — LACTIC ACID, PLASMA
Lactic Acid, Venous: 3 mmol/L (ref 0.5–1.9)
Lactic Acid, Venous: 1.3 mmol/L (ref 0.5–1.9)

## 2023-10-15 LAB — BRAIN NATRIURETIC PEPTIDE: B Natriuretic Peptide: 51.3 pg/mL (ref 0.0–100.0)

## 2023-10-15 MED ORDER — INSULIN ASPART 100 UNIT/ML IJ SOLN
0.0000 [IU] | Freq: Three times a day (TID) | INTRAMUSCULAR | Status: DC
  Administered 2023-10-15: 3 [IU] via SUBCUTANEOUS
  Administered 2023-10-15 (×2): 2 [IU] via SUBCUTANEOUS
  Administered 2023-10-16 – 2023-10-17 (×3): 1 [IU] via SUBCUTANEOUS
  Filled 2023-10-15 (×6): qty 1

## 2023-10-15 MED ORDER — DEXAMETHASONE SODIUM PHOSPHATE 4 MG/ML IJ SOLN
4.0000 mg | Freq: Two times a day (BID) | INTRAMUSCULAR | Status: DC
  Administered 2023-10-15 – 2023-10-18 (×8): 4 mg via INTRAVENOUS
  Filled 2023-10-15 (×9): qty 1

## 2023-10-15 MED ORDER — AMLODIPINE BESYLATE 5 MG PO TABS
5.0000 mg | ORAL_TABLET | Freq: Every day | ORAL | Status: DC
Start: 2023-10-15 — End: 2023-10-18
  Administered 2023-10-15 – 2023-10-18 (×4): 5 mg via ORAL
  Filled 2023-10-15 (×4): qty 1

## 2023-10-15 MED ORDER — PANTOPRAZOLE SODIUM 40 MG PO TBEC
40.0000 mg | DELAYED_RELEASE_TABLET | Freq: Every day | ORAL | Status: DC
  Administered 2023-10-15 – 2023-10-18 (×4): 40 mg via ORAL
  Filled 2023-10-15 (×4): qty 1

## 2023-10-15 MED ORDER — ASPIRIN 81 MG PO TBEC
81.0000 mg | DELAYED_RELEASE_TABLET | Freq: Every day | ORAL | Status: DC
  Administered 2023-10-15 – 2023-10-18 (×4): 81 mg via ORAL
  Filled 2023-10-15 (×4): qty 1

## 2023-10-15 MED ORDER — INSULIN ASPART 100 UNIT/ML IJ SOLN
0.0000 [IU] | Freq: Every day | INTRAMUSCULAR | Status: DC
  Administered 2023-10-15: 2 [IU] via SUBCUTANEOUS
  Filled 2023-10-15: qty 1

## 2023-10-15 MED ORDER — TAMSULOSIN HCL 0.4 MG PO CAPS
0.4000 mg | ORAL_CAPSULE | Freq: Every day | ORAL | Status: DC
  Administered 2023-10-15 – 2023-10-17 (×3): 0.4 mg via ORAL
  Filled 2023-10-15 (×3): qty 1

## 2023-10-15 MED ORDER — LACTATED RINGERS IV BOLUS
500.0000 mL | Freq: Once | INTRAVENOUS | Status: AC
  Administered 2023-10-15: 500 mL via INTRAVENOUS

## 2023-10-15 MED ORDER — ATORVASTATIN CALCIUM 20 MG PO TABS
40.0000 mg | ORAL_TABLET | Freq: Every day | ORAL | Status: DC
  Administered 2023-10-15 – 2023-10-18 (×4): 40 mg via ORAL
  Filled 2023-10-15 (×4): qty 2

## 2023-10-15 MED ORDER — DONEPEZIL HCL 5 MG PO TABS
10.0000 mg | ORAL_TABLET | Freq: Every day | ORAL | Status: DC
  Administered 2023-10-15: 10 mg via ORAL
  Filled 2023-10-15: qty 2

## 2023-10-15 NOTE — Progress Notes (Addendum)
 Progress Note   Patient: Jason Washington FMW:[id number] DOB: 08-Jul-1945 DOA: 10/14/2023     1 DOS: the patient was seen and examined on 10/15/2023   Brief hospital course:  Jason Washington is a 78 y.o. male with medical history significant of 3 mm right ureteral stone with hydronephrosis (possibly dislodged into bladder 07/24/23 ), HTN, HLD, DM, dCHF, dementia, prostate cancer, obesity, smoker, meningioma, who presents with confusion.   Patient has AMS, and is unable to provide accurate medical history, therefore, most of the history is obtained by discussing the case with ED physician, per EMS report, and with the nursing staff.   Per EDP, his wife reported that pt did not eat breakfast, and  slept most of the day which is unusual for him.  Subsequently he left the house to go to [facility] but could not figure out how to put his truck in reverse.  The wife became worried about him and tried to call him, but he was not picking up.  wife went to find him, and found him in his truck in the parking lot unable to get out of it and is confused. He was unable to walk on his own at that time.    When I saw pt on the floor, he is confused, and knows his own name, not oriented to place and time.  Patient moves all extremities normally.  No facial droop or slurred speech.  No active respiratory distress, cough, SOB noted.  No active nausea, vomiting, diarrhea noted.  Patient does not seem to have abdominal pain or chest pain.  He denies flank pain.  He also denies symptoms of UTI which is are slightly not reliable due to confusion.  Patient has fever with temperature 103 in ED.    Assessment and Plan:  Sepsis due to urinary tract infection [facility]):  Patient met criteria for severe sepsis with WBC 15.2, temperature 103, heart rate up to 114, RR 22.  Lactic acid 3.1 --> 2.5.  Pt has history of 3mm right ureteral stone.  We need to rule out recurrent kidney stone. Renal stone CT shows 4 mm obstructive  stone within the mid right ureter with secondary mild right hydroureteronephrosis. Few additional nonobstructive right renal calculi measuring up to 7 mm Continue empiric antibiotic therapy with IV Rocephin  Consult urology. Paged. Awaiting call back Follow-up results of urine culture    Meningioma:  Patient has history of known meningioma and is scheduled for surgical excision on 10/19/2023.   CT scan showed stable meningioma, with stable 5 mm rightward midline shift of the lateral ventricles. Still has some mass effect and vasogenic edema. Continue Decadron  4 mg twice daily     Acute metabolic encephalopathy:   Likely due to UTI.  Improved He has history of meningioma, which is stable. Fall precautions Frequent neurocheck    Hypertension Start patient on amlodipine  5 mg daily   Chronic diastolic CHF (congestive heart failure) (HCC):  2D echo on 07/13/2023 showed EF> 55% with grade 1 diastolic dysfunction.  CHF seem to be compensated. Optimize blood pressure control    HLD (hyperlipidemia) Continue Lipitor    Diabetes mellitus without complication [facility]):  Recent A1c 6.2, well-controlled. SSI Maintain consistent carbohydrate diet   Myocardial injury: Trop is minimally elevated 18 --> 18.   Likely demand ischemia. Continue aspirin  and  Lipitor    Mild cognitive impairment Donepezil  is on hold due to second-degree AV block   Sleep apnea: -Hold CPAP due to  confusion   Obesity (BMI 30-39.9): Patient has Obesity Class II, with body weight 116.6  Kg and BMI 35.80 kg/m2.  - Encourage losing weight - Exercise and healthy diet              Subjective: No new complaints.  Awake, alert and oriented x 3  Physical Exam: Vitals:   10/15/23 0027 10/15/23 0413 10/15/23 0417 10/15/23 0757  BP: (!) 142/61 (!) 148/77  (!) 141/70  Pulse: (!) 102 88  77  Resp: 18 18  17   Temp: 99.7 F (37.6 C) 99.9 F (37.7 C)  98.2 F (36.8 C)  TempSrc: Oral   Oral  SpO2: 96% 91%   94%  Weight:   116.6 kg    General: Not in acute distress HEENT: Eyes: PERRL, EOMI, no jaundice ENT: No discharge from the ears and nose. Neck: No JVD, no bruit, no mass felt. Heme: No neck lymph node enlargement. Cardiac: S1/S2, RRR, No murmurs, No gallops or rubs. Respiratory: No rales, wheezing, rhonchi or rubs. GI: Soft, nondistended, nontender, no organomegaly, BS present. GU: No hematuria Ext: No pitting leg edema bilaterally. 1+DP/PT pulse bilaterally. Musculoskeletal: No joint deformities, No joint redness or warmth, no limitation of ROM in spin. Skin: No rashes.  Neuro: Awake, alert and oriented x 3.  Cranial nerves II-XII grossly intact, moves all extremities normally. Psych: Patient is not psychotic, no suicidal or hemocidal ideation.     Data Reviewed: White count 15.3, hemoglobin 13.2, BUN 16, creatinine 0.80, lactic acid 1.3 Labs reviewed  Family Communication: Plan of care discussed with patient and his wife at the bedside.  All questions and concerns have been addressed.  They verbalized understanding and agree with the plan.  Disposition: Status is: Inpatient Remains inpatient appropriate because: Needs urology consult for mild hydronephrosis  Planned Discharge Destination: Home    Time spent: 50 minutes  Author: Aimee Somerset, MD 10/15/2023 3:04 PM  For on call review [email or web address].

## 2023-10-15 NOTE — H&P (View-Only) (Signed)
 Consultation: Right ureteral stone, sepsis Requested by: Dr. Aimee Agbata  History of Present Illness: Jason Washington is a 78 year old male who had confusion.  He presented to emergency and had a temp of 102 and heart rate of 114.  White count was 15.2 and creatinine 0.92.  UA with rare bacteria and greater than 50 white cells.  Culture pending.  He was given fluids and antibiotics.  A CT scan abdomen and pelvis was obtained with the results that came back early this morning that showed a 4 mm obstructive stone within the mid right ureter with secondary mild right hydroureteronephrosis.  Urology was consulted this afternoon and after resuscitation patient has been afebrile with stable vitals.  Heart rate 63.  White count 15.3.  Creatinine 0.8.  He is doing well now.  Resting and watching TV, talking with his wife.  Confusion has resolved.  He ate a good breakfast and then ate half his lunch around 2 PM.  No nausea or vomiting.  No dysuria or gross hematuria.  He admits he did not drink a lot of water.  Evidently has had more memory issues and sleepiness over the past year or so and has a meningioma that needs to be removed.  He has a history of stone and passed a 3 mm right distal at [facility] in June 2025. He underwent robotic radical prostatectomy for Gleason 4+5 disease in 2012 along with bilateral pelvic lymph node dissection which indicated extraprostatic extension, bilateral seminal vesicle disease.  Lymph nodes were negative.   He underwent adjuvant radiation therapy.  He was previously followed by Dr. Vonzell at [facility] urology and saw Dr. Penne in 2020 for SUI.  Past Medical History:  Diagnosis Date   Actinic keratosis    Basal cell carcinoma 11/21/2019   right superior lat pectoral/ EDC   Basal cell carcinoma 08/12/2022   Right sideburn. Mohs pending.   Chronic GERD    Diabetes mellitus without complication (HCC)    Genital herpes    Hepatic steatosis    HLD (hyperlipidemia)    Hypertension     Imbalance    Melanoma (HCC)    R knee several years ago    Mild cognitive impairment with memory loss    Neuropathy    Prostate CA (HCC)    Sleep apnea    Squamous cell carcinoma of skin 12/22/2021   left forearm-anterior treated wiht [facility]   Thoracic aortic aneurysm [facility])    Past Surgical History:  Procedure Laterality Date   APPENDECTOMY     CATARACT EXTRACTION W/PHACO Left 08/03/2022   Procedure: CATARACT EXTRACTION PHACO AND INTRAOCULAR LENS PLACEMENT (IOC) LEFT CLAREON PANOPTIX TORIC  9.11  00:47.4;  Surgeon: Jaye Fallow, MD;  Location: [facility];  Service: Ophthalmology;  Laterality: Left;   CATARACT EXTRACTION W/PHACO Right 08/17/2022   Procedure: CATARACT EXTRACTION PHACO AND INTRAOCULAR LENS PLACEMENT (IOC) RIGHT CLAREON PANOPTIX TORIC;  Surgeon: Jaye Fallow, MD;  Location: [facility];  Service: Ophthalmology;  Laterality: Right;  14.56 1:18.0   COLONOSCOPY WITH PROPOFOL  N/A 10/07/2021   Procedure: COLONOSCOPY WITH PROPOFOL ;  Surgeon: Toledo, Ladell POUR, MD;  Location: [facility] ENDOSCOPY;  Service: Endoscopy;  Laterality: N/A;   HERNIA REPAIR     PROSTATE SURGERY     TONSILLECTOMY      Home Medications:  Medications Prior to Admission  Medication Sig Dispense Refill Last Dose/Taking   acyclovir (ZOVIRAX) 400 MG tablet Take 400 mg by mouth 2 (two) times daily.  amLODipine  (NORVASC ) 10 MG tablet Take 1 tablet (10 mg total) by mouth daily. 30 tablet 0    aspirin  EC 81 MG tablet Take 81 mg by mouth daily. Swallow whole.      atorvastatin  (LIPITOR) 40 MG tablet atorvastatin  40 mg tablet      budesonide (ENTOCORT EC) 3 MG 24 hr capsule Take 9 mg by mouth daily.      cetirizine (ZYRTEC) 10 MG tablet Take 10 mg by mouth daily. Barkley & Sheryle      donepezil  (ARICEPT ) 10 MG tablet Take 10 mg by mouth at bedtime.      fluorouracil  (EFUDEX ) 5 % cream Apply to scalp twice a day x 10 days, then stop 30 g 1    hydrochlorothiazide (HYDRODIURIL) 25 MG tablet  hydrochlorothiazide 25 mg tablet      ketoconazole  (NIZORAL ) 2 % cream Apply 1 Application topically at bedtime. Qhs to feet 60 g 6    mupirocin  ointment (BACTROBAN ) 2 % Apply 1 Application topically daily. 22 g 0    omeprazole (PRILOSEC) 40 MG capsule omeprazole 40 mg capsule,delayed release      valsartan (DIOVAN) 80 MG tablet Take 80 mg by mouth daily.      Allergies: No Known Allergies  History reviewed. No pertinent family history. Social History:  reports that he has been smoking cigarettes. He has a 10 pack-year smoking history. He has never used smokeless tobacco. He reports current alcohol use. He reports that he does not use drugs.  ROS: A complete review of systems was performed.  All systems are negative except for pertinent findings as noted. Review of Systems  All other systems reviewed and are negative.    Physical Exam:  Vital signs in last 24 hours: Temp:  [97.3 F (36.3 C)-103 F (39.4 C)] 97.3 F (36.3 C) (08/30 1626) Pulse Rate:  [63-114] 63 (08/30 1626) Resp:  [17-22] 17 (08/30 1626) BP: (133-163)/(61-128) 133/61 (08/30 1626) SpO2:  [91 %-97 %] 97 % (08/30 1626) Weight:  [116.6 kg] 116.6 kg (08/30 0417) General:  Alert and oriented, No acute distress HEENT: Normocephalic, atraumatic Cardiovascular: Regular rate and rhythm Lungs: Regular rate and effort Abdomen: Soft, nontender, nondistended, no abdominal masses Back: No CVA tenderness Extremities: No edema Neurologic: Grossly intact  Laboratory Data:  Results for orders placed or performed during the hospital encounter of 10/14/23 (from the past 24 hours)  CBG monitoring, ED     Status: Abnormal   Collection Time: 10/14/23  8:06 PM  Result Value Ref Range   Glucose-Capillary 138 (H) 70 - 99 mg/dL  CBC with Differential     Status: Abnormal   Collection Time: 10/14/23  8:12 PM  Result Value Ref Range   WBC 15.2 (H) 4.0 - 10.5 K/uL   RBC 4.46 4.22 - 5.81 MIL/uL   Hemoglobin 14.8 13.0 - 17.0 g/dL    HCT 55.8 60.9 - 47.9 %   MCV 98.9 80.0 - 100.0 fL   MCH 33.2 26.0 - 34.0 pg   MCHC 33.6 30.0 - 36.0 g/dL   RDW 86.9 88.4 - 84.4 %   Platelets 276 150 - 400 K/uL   nRBC 0.0 0.0 - 0.2 %   Neutrophils Relative % 78 %   Neutro Abs 12.0 (H) 1.7 - 7.7 K/uL   Lymphocytes Relative 10 %   Lymphs Abs 1.4 0.7 - 4.0 K/uL   Monocytes Relative 9 %   Monocytes Absolute 1.3 (H) 0.1 - 1.0 K/uL   Eosinophils Relative 0 %  Eosinophils Absolute 0.0 0.0 - 0.5 K/uL   Basophils Relative 1 %   Basophils Absolute 0.1 0.0 - 0.1 K/uL   Immature Granulocytes 2 %   Abs Immature Granulocytes 0.29 (H) 0.00 - 0.07 K/uL  Lactic acid, plasma     Status: Abnormal   Collection Time: 10/14/23  8:12 PM  Result Value Ref Range   Lactic Acid, Venous 3.1 (HH) 0.5 - 1.9 mmol/L  Comprehensive metabolic panel     Status: Abnormal   Collection Time: 10/14/23  8:12 PM  Result Value Ref Range   Sodium 137 135 - 145 mmol/L   Potassium 3.8 3.5 - 5.1 mmol/L   Chloride 100 98 - 111 mmol/L   CO2 23 22 - 32 mmol/L   Glucose, Bld 132 (H) 70 - 99 mg/dL   BUN 25 (H) 8 - 23 mg/dL   Creatinine, Ser 9.07 0.61 - 1.24 mg/dL   Calcium  9.9 8.9 - 10.3 mg/dL   Total Protein 8.2 (H) 6.5 - 8.1 g/dL   Albumin 4.2 3.5 - 5.0 g/dL   AST 26 15 - 41 U/L   ALT 29 0 - 44 U/L   Alkaline Phosphatase 55 38 - 126 U/L   Total Bilirubin 1.0 0.0 - 1.2 mg/dL   GFR, Estimated >39 >39 mL/min   Anion gap 14 5 - 15  Troponin I (High Sensitivity)     Status: Abnormal   Collection Time: 10/14/23  8:12 PM  Result Value Ref Range   Troponin I (High Sensitivity) 18 (H) <18 ng/L  Blood Culture (routine x 2)     Status: None (Preliminary result)   Collection Time: 10/14/23  8:12 PM   Specimen: BLOOD  Result Value Ref Range   Specimen Description BLOOD BLOOD LEFT FOREARM    Special Requests      BOTTLES DRAWN AEROBIC AND ANAEROBIC Blood Culture results may not be optimal due to an inadequate volume of blood received in culture bottles   Culture      NO  GROWTH < 12 HOURS Performed at [facility], [address] [place], [place] [address]    Report Status PENDING   Brain natriuretic peptide     Status: None   Collection Time: 10/14/23  8:12 PM  Result Value Ref Range   B Natriuretic Peptide 51.3 0.0 - 100.0 pg/mL  Resp panel by RT-PCR (RSV, Flu A&B, Covid) Anterior Nasal Swab     Status: None   Collection Time: 10/14/23  8:45 PM   Specimen: Anterior Nasal Swab  Result Value Ref Range   SARS Coronavirus 2 by RT PCR NEGATIVE NEGATIVE   Influenza A by PCR NEGATIVE NEGATIVE   Influenza B by PCR NEGATIVE NEGATIVE   Resp Syncytial Virus by PCR NEGATIVE NEGATIVE  Blood Culture (routine x 2)     Status: None (Preliminary result)   Collection Time: 10/14/23  8:55 PM   Specimen: BLOOD  Result Value Ref Range   Specimen Description BLOOD BLOOD LEFT ARM    Special Requests      BOTTLES DRAWN AEROBIC AND ANAEROBIC Blood Culture adequate volume   Culture      NO GROWTH < 12 HOURS Performed at [facility], [address] [place], [place] [address]    Report Status PENDING   Lactic acid, plasma     Status: Abnormal   Collection Time: 10/14/23  9:38 PM  Result Value Ref Range   Lactic Acid, Venous 2.5 (HH) 0.5 - 1.9 mmol/L  Urinalysis, w/ Reflex to Culture (Infection Suspected) -Urine, Catheterized     Status: Abnormal   Collection Time: 10/14/23 10:36 PM  Result Value Ref Range   Specimen Source URINE, CATHETERIZED    Color, Urine YELLOW (A) YELLOW   APPearance HAZY (A) CLEAR   Specific Gravity, Urine 1.016 1.005 - 1.030   pH 8.0 5.0 - 8.0   Glucose, UA NEGATIVE NEGATIVE mg/dL   Hgb urine dipstick SMALL (A) NEGATIVE   Bilirubin Urine NEGATIVE NEGATIVE   Ketones, ur NEGATIVE NEGATIVE mg/dL   Protein, ur 30 (A) NEGATIVE mg/dL   Nitrite NEGATIVE NEGATIVE   Leukocytes,Ua LARGE (A) NEGATIVE   RBC / HPF 0-5 0 - 5 RBC/hpf   WBC, UA >50 0 - 5 WBC/hpf   Bacteria, UA RARE (A) NONE SEEN   Squamous Epithelial / HPF 0 0 -  5 /HPF   Mucus PRESENT   Troponin I (High Sensitivity)     Status: Abnormal   Collection Time: 10/14/23 10:36 PM  Result Value Ref Range   Troponin I (High Sensitivity) 18 (H) <18 ng/L  Glucose, capillary     Status: Abnormal   Collection Time: 10/15/23 12:34 AM  Result Value Ref Range   Glucose-Capillary 127 (H) 70 - 99 mg/dL   Comment 1 Notify RN   Lactic acid, plasma     Status: Abnormal   Collection Time: 10/15/23 12:36 AM  Result Value Ref Range   Lactic Acid, Venous 3.0 (HH) 0.5 - 1.9 mmol/L  Lactic acid, plasma     Status: None   Collection Time: 10/15/23  6:27 AM  Result Value Ref Range   Lactic Acid, Venous 1.3 0.5 - 1.9 mmol/L  Basic metabolic panel     Status: Abnormal   Collection Time: 10/15/23  6:27 AM  Result Value Ref Range   Sodium 136 135 - 145 mmol/L   Potassium 3.6 3.5 - 5.1 mmol/L   Chloride 101 98 - 111 mmol/L   CO2 25 22 - 32 mmol/L   Glucose, Bld 171 (H) 70 - 99 mg/dL   BUN 16 8 - 23 mg/dL   Creatinine, Ser 9.19 0.61 - 1.24 mg/dL   Calcium  9.0 8.9 - 10.3 mg/dL   GFR, Estimated >39 >39 mL/min   Anion gap 10 5 - 15  CBC     Status: Abnormal   Collection Time: 10/15/23  6:27 AM  Result Value Ref Range   WBC 15.3 (H) 4.0 - 10.5 K/uL   RBC 3.94 (L) 4.22 - 5.81 MIL/uL   Hemoglobin 13.2 13.0 - 17.0 g/dL   HCT 61.6 (L) 60.9 - 47.9 %   MCV 97.2 80.0 - 100.0 fL   MCH 33.5 26.0 - 34.0 pg   MCHC 34.5 30.0 - 36.0 g/dL   RDW 87.0 88.4 - 84.4 %   Platelets 217 150 - 400 K/uL   nRBC 0.0 0.0 - 0.2 %  Glucose, capillary     Status: Abnormal   Collection Time: 10/15/23  8:09 AM  Result Value Ref Range   Glucose-Capillary 156 (H) 70 - 99 mg/dL  Glucose, capillary     Status: Abnormal   Collection Time: 10/15/23  1:44 PM  Result Value Ref Range   Glucose-Capillary 173 (H) 70 - 99 mg/dL   Recent Results (from the past 240 hours)  Blood Culture (routine x 2)     Status: None (Preliminary result)   Collection Time: 10/14/23  8:12 PM   Specimen: BLOOD  Result  Value  Ref Range Status   Specimen Description BLOOD BLOOD LEFT FOREARM  Final   Special Requests   Final    BOTTLES DRAWN AEROBIC AND ANAEROBIC Blood Culture results may not be optimal due to an inadequate volume of blood received in culture bottles   Culture   Final    NO GROWTH < 12 HOURS Performed at [facility], [address], [place], [place] [address]    Report Status PENDING  Incomplete  Resp panel by RT-PCR (RSV, Flu A&B, Covid) Anterior Nasal Swab     Status: None   Collection Time: 10/14/23  8:45 PM   Specimen: Anterior Nasal Swab  Result Value Ref Range Status   SARS Coronavirus 2 by RT PCR NEGATIVE NEGATIVE Final    Comment: (NOTE) SARS-CoV-2 target nucleic acids are NOT DETECTED.  The SARS-CoV-2 RNA is generally detectable in upper respiratory specimens during the acute phase of infection. The lowest concentration of SARS-CoV-2 viral copies this assay can detect is 138 copies/mL. A negative result does not preclude SARS-Cov-2 infection and should not be used as the sole basis for treatment or other patient management decisions. A negative result may occur with  improper specimen collection/handling, submission of specimen other than nasopharyngeal swab, presence of viral mutation(s) within the areas targeted by this assay, and inadequate number of viral copies(<138 copies/mL). A negative result must be combined with clinical observations, patient history, and epidemiological information. The expected result is Negative.  Fact Sheet for Patients:  BloggerCourse.com  Fact Sheet for Healthcare Providers:  SeriousBroker.it  This test is no t yet approved or cleared by the [place]  FDA and  has been authorized for detection and/or diagnosis of SARS-CoV-2 by FDA under an Emergency Use Authorization (EUA). This EUA will remain  in effect (meaning this test can be used) for the duration of the COVID-19  declaration under Section 564(b)(1) of the Act, 21 U.S.C.section 360bbb-3(b)(1), unless the authorization is terminated  or revoked sooner.       Influenza A by PCR NEGATIVE NEGATIVE Final   Influenza B by PCR NEGATIVE NEGATIVE Final    Comment: (NOTE) The Xpert Xpress SARS-CoV-2/FLU/RSV plus assay is intended as an aid in the diagnosis of influenza from Nasopharyngeal swab specimens and should not be used as a sole basis for treatment. Nasal washings and aspirates are unacceptable for Xpert Xpress SARS-CoV-2/FLU/RSV testing.  Fact Sheet for Patients: BloggerCourse.com  Fact Sheet for Healthcare Providers: SeriousBroker.it  This test is not yet approved or cleared by the [place]  FDA and has been authorized for detection and/or diagnosis of SARS-CoV-2 by FDA under an Emergency Use Authorization (EUA). This EUA will remain in effect (meaning this test can be used) for the duration of the COVID-19 declaration under Section 564(b)(1) of the Act, 21 U.S.C. section 360bbb-3(b)(1), unless the authorization is terminated or revoked.     Resp Syncytial Virus by PCR NEGATIVE NEGATIVE Final    Comment: (NOTE) Fact Sheet for Patients: BloggerCourse.com  Fact Sheet for Healthcare Providers: SeriousBroker.it  This test is not yet approved or cleared by the [place]  FDA and has been authorized for detection and/or diagnosis of SARS-CoV-2 by FDA under an Emergency Use Authorization (EUA). This EUA will remain in effect (meaning this test can be used) for the duration of the COVID-19 declaration under Section 564(b)(1) of the Act, 21 U.S.C. section 360bbb-3(b)(1), unless the authorization is terminated or revoked.  Performed at [facility], [address], [place], [place] [address]  Blood Culture (routine x 2)     Status: None (Preliminary result)   Collection  Time: 10/14/23  8:55 PM   Specimen: BLOOD  Result Value Ref Range Status   Specimen Description BLOOD BLOOD LEFT ARM  Final   Special Requests   Final    BOTTLES DRAWN AEROBIC AND ANAEROBIC Blood Culture adequate volume   Culture   Final    NO GROWTH < 12 HOURS Performed at [facility], [address] [place], [place] [address]    Report Status PENDING  Incomplete   Creatinine: Recent Labs    10/14/23 2012 10/15/23 0627  CREATININE 0.92 0.80    Impression/Assessment:  Right ureteral stone with sepsis that has resolved.  He is in a unique position because he has been resuscitated and now has stable vitals.  White count slightly elevated.  He has had no flank pain and is tolerating a regular diet.  A lot of his confusion is cleared and he is back to baseline and alert and oriented.  Plan:  I had a long discussion with the patient and his wife on the nature risks and benefits of continued medical management with medical expulsion therapy versus placing a ureteral stent on the right.  We discussed if he does not get the stent placed he could get repeat episodes of pain fever or confusion but may be able to avoid a urologic procedure.  On the other hand a stent should prevent any further obstruction or pain from the stone but then he will be obligated more for a right ureteroscopy and laser lithotripsy in the future which at that time the right renal stone could also be addressed.  We discussed the risk of stents with infection, bleeding and pain among others and the rationale for simply placing a stent with a right proximal stone in his presentation and not doing ureteroscopy at the present time.  They would like to continue medical expulsion therapy and passed the stone, but they would like to discuss further and possibly plan for a stent tomorrow. We discussed the importance of follow-up if he has a stent.  If he does go home without a stent, recommend tamsulosin , pain medicine,  antibiotics for 7 to 10 days. I encouraged the patient to stay hydrated.   ADD: Spoke with his wife and patient two more times and discussed things. They elected to proceed with the right stent to plan URS/laser in the future. Post for OR - will be after lunch tomorrow unless a second room opens.   Donnice Brooks 10/15/2023, 4:38 PM

## 2023-10-15 NOTE — Evaluation (Signed)
 Physical Therapy Evaluation Patient Details Name: Jason Washington MRN: [id number] DOB: [birth year] Today's Date: 10/15/2023  History of Present Illness  Pt is a 78 y/o M admitted on 10/14/23 after presenting with AMS. Pt is being treated for sepsis 2/2 UTI, acute metabolic encephalopathy. PMH: 3mm R ureteral stone with hydronephrosis, HTN, HLD, DM, dCHF, dementia, prostate CA, obesity, smoker, meningioma  Clinical Impression  Pt seen for PT evaluation with wife present & providing PLOF, home set up information. Prior to admission pt was independent without AD, driving, denies falls. On this date, pt is oriented to self only. Pt is able to ambulate 1 lap around unit without AD with CGA with impaired gait pattern as noted below but wife reporting this is close to baseline. Pt reports need to toilet & has continent BM, performing peri hygiene without assistance. Recommend ongoing PT services to progress balance, gait, strengthening to reduce fall risk. Educated pt's wife on use of gait belt to assist pt with mobility.        If plan is discharge home, recommend the following: A little help with walking and/or transfers;A little help with bathing/dressing/bathroom;Assistance with cooking/housework;Help with stairs or ramp for entrance;Assist for transportation;Supervision due to cognitive status;Direct supervision/assist for medications management   Can travel by private vehicle        Equipment Recommendations Rolling walker (2 wheels)  Recommendations for Other Services       Functional Status Assessment Patient has had a recent decline in their functional status and demonstrates the ability to make significant improvements in function in a reasonable and predictable amount of time.     Precautions / Restrictions Precautions Precautions: Fall Restrictions Weight Bearing Restrictions Per Provider Order: No      Mobility  Bed Mobility Overal bed mobility: Needs Assistance Bed  Mobility: Supine to Sit     Supine to sit: Used rails, Supervision, HOB elevated (exit L side of bed)          Transfers Overall transfer level: Needs assistance Equipment used: None Transfers: Sit to/from Stand Sit to Stand: Supervision, Contact guard assist           General transfer comment: CGA sit>stand from EOB, supervision sit<>stand from toilet with cuing re: use of grab bars    Ambulation/Gait Ambulation/Gait assistance: Contact guard assist Gait Distance (Feet): 160 Feet Assistive device: None Gait Pattern/deviations: Decreased step length - right, Decreased step length - left, Decreased dorsiflexion - right, Decreased dorsiflexion - left, Decreased stride length, Shuffle Gait velocity: decreased     General Gait Details: decreased dorsiflexion & heel strike BLE, wife reports this gait pattern is normal  Careers information officer     Tilt Bed    Modified Rankin (Stroke Patients Only)       Balance Overall balance assessment: Needs assistance Sitting-balance support: Feet supported Sitting balance-Leahy Scale: Good Sitting balance - Comments: peri hygiene sitting on toilet without LOB   Standing balance support: During functional activity, No upper extremity supported Standing balance-Leahy Scale: Fair                               Pertinent Vitals/Pain Pain Assessment Pain Assessment: No/denies pain    Home Living Family/patient expects to be discharged to:: Private residence Living Arrangements: Spouse/significant other Available Help at Discharge: Family;Available 24 hours/day Type of Home: Mobile home (double wide) Home Access:  Stairs to enter Entrance Stairs-Rails: Right;Left;Can reach both Entrance Stairs-Number of Steps: 6 steps at side door with B rails (wideset), 2 steps at front door (with B rails), 6 steps at back door (with B rails)   Home Layout: One level Home Equipment: Shower seat - built  in;Grab bars - tub/shower;Cane - single point (fold down shower seat, walking stick vs true SPC)      Prior Function Prior Level of Function : Independent/Modified Independent             Mobility Comments: ambulatory without AD, driving, denies falls in the past 6 months ADLs Comments: Pushes on vanity to transfer sit/stand from toilet, bathes & dresses himself independently     Extremity/Trunk Assessment   Upper Extremity Assessment Upper Extremity Assessment: Overall WFL for tasks assessed    Lower Extremity Assessment Lower Extremity Assessment: Overall WFL for tasks assessed;Generalized weakness       Communication   Communication Communication: No apparent difficulties    Cognition Arousal: Alert Behavior During Therapy: WFL for tasks assessed/performed   PT - Cognitive impairments: Orientation, Memory, Awareness, Attention, Problem solving, Safety/Judgement   Orientation impairments: Place, Time, Situation                   PT - Cognition Comments: Pt oriented to name & birthday, requires extra time, 2 attempts to recall correct age. Reports he's in [place] at [facility] for his sx next week. PT educates him on [facility] in [place] 2/2 UTI. Following commands: Intact       Cueing Cueing Techniques: Verbal cues     General Comments General comments (skin integrity, edema, etc.): continent BM on toilet    Exercises     Assessment/Plan    PT Assessment Patient needs continued PT services  PT Problem List Decreased strength;Decreased coordination;Decreased range of motion;Decreased activity tolerance;Decreased cognition;Decreased knowledge of use of DME;Decreased safety awareness;Decreased balance;Decreased mobility       PT Treatment Interventions DME instruction;Balance training;Gait training;Neuromuscular re-education;Stair training;Cognitive remediation;Functional mobility training;Therapeutic activities;Therapeutic exercise;Patient/family  education    PT Goals (Current goals can be found in the Care Plan section)  Acute Rehab PT Goals Patient Stated Goal: get better, go home, have sx that is already scheduled for 9/3 PT Goal Formulation: With patient/family Time For Goal Achievement: 10/29/23 Potential to Achieve Goals: Good    Frequency Min 2X/week     Co-evaluation               AM-PAC PT 6 Clicks Mobility  Outcome Measure Help needed turning from your back to your side while in a flat bed without using bedrails?: None Help needed moving from lying on your back to sitting on the side of a flat bed without using bedrails?: A Little Help needed moving to and from a bed to a chair (including a wheelchair)?: A Little Help needed standing up from a chair using your arms (e.g., wheelchair or bedside chair)?: A Little Help needed to walk in hospital room?: A Little Help needed climbing 3-5 steps with a railing? : A Little 6 Click Score: 19    End of Session Equipment Utilized During Treatment: Gait belt Activity Tolerance: Patient tolerated treatment well Patient left: in bed;with nursing/sitter in room;with family/visitor present Nurse Communication: Mobility status PT Visit Diagnosis: Unsteadiness on feet (R26.81);Muscle weakness (generalized) (M62.81);Other abnormalities of gait and mobility (R26.89)    Time: 8964-8892 PT Time Calculation (min) (ACUTE ONLY): 32 min   Charges:   PT Evaluation $PT  Eval Low Complexity: 1 Low PT Treatments $Therapeutic Activity: 8-22 mins PT General Charges $$ ACUTE PT VISIT: 1 Visit         Jason Washington, PT, DPT 10/15/23, 11:15 AM   Jason CHRISTELLA Washington 10/15/2023, 11:14 AM

## 2023-10-15 NOTE — Consult Note (Addendum)
 Consultation: Right ureteral stone, sepsis Requested by: Dr. Aimee Agbata  History of Present Illness: Jason Washington is a 78 year old male who had confusion.  He presented to emergency and had a temp of 102 and heart rate of 114.  White count was 15.2 and creatinine 0.92.  UA with rare bacteria and greater than 50 white cells.  Culture pending.  He was given fluids and antibiotics.  A CT scan abdomen and pelvis was obtained with the results that came back early this morning that showed a 4 mm obstructive stone within the mid right ureter with secondary mild right hydroureteronephrosis.  Urology was consulted this afternoon and after resuscitation patient has been afebrile with stable vitals.  Heart rate 63.  White count 15.3.  Creatinine 0.8.  He is doing well now.  Resting and watching TV, talking with his wife.  Confusion has resolved.  He ate a good breakfast and then ate half his lunch around 2 PM.  No nausea or vomiting.  No dysuria or gross hematuria.  He admits he did not drink a lot of water.  Evidently has had more memory issues and sleepiness over the past year or so and has a meningioma that needs to be removed.  He has a history of stone and passed a 3 mm right distal at [facility] in June 2025. He underwent robotic radical prostatectomy for Gleason 4+5 disease in 2012 along with bilateral pelvic lymph node dissection which indicated extraprostatic extension, bilateral seminal vesicle disease.  Lymph nodes were negative.   He underwent adjuvant radiation therapy.  He was previously followed by Dr. Vonzell at [facility] urology and saw Dr. Penne in 2020 for SUI.  Past Medical History:  Diagnosis Date   Actinic keratosis    Basal cell carcinoma 11/21/2019   right superior lat pectoral/ EDC   Basal cell carcinoma 08/12/2022   Right sideburn. Mohs pending.   Chronic GERD    Diabetes mellitus without complication (HCC)    Genital herpes    Hepatic steatosis    HLD (hyperlipidemia)    Hypertension     Imbalance    Melanoma (HCC)    R knee several years ago    Mild cognitive impairment with memory loss    Neuropathy    Prostate CA (HCC)    Sleep apnea    Squamous cell carcinoma of skin 12/22/2021   left forearm-anterior treated wiht [facility]   Thoracic aortic aneurysm [facility])    Past Surgical History:  Procedure Laterality Date   APPENDECTOMY     CATARACT EXTRACTION W/PHACO Left 08/03/2022   Procedure: CATARACT EXTRACTION PHACO AND INTRAOCULAR LENS PLACEMENT (IOC) LEFT CLAREON PANOPTIX TORIC  9.11  00:47.4;  Surgeon: Jaye Fallow, MD;  Location: [facility];  Service: Ophthalmology;  Laterality: Left;   CATARACT EXTRACTION W/PHACO Right 08/17/2022   Procedure: CATARACT EXTRACTION PHACO AND INTRAOCULAR LENS PLACEMENT (IOC) RIGHT CLAREON PANOPTIX TORIC;  Surgeon: Jaye Fallow, MD;  Location: [facility];  Service: Ophthalmology;  Laterality: Right;  14.56 1:18.0   COLONOSCOPY WITH PROPOFOL  N/A 10/07/2021   Procedure: COLONOSCOPY WITH PROPOFOL ;  Surgeon: Toledo, Ladell POUR, MD;  Location: [facility] ENDOSCOPY;  Service: Endoscopy;  Laterality: N/A;   HERNIA REPAIR     PROSTATE SURGERY     TONSILLECTOMY      Home Medications:  Medications Prior to Admission  Medication Sig Dispense Refill Last Dose/Taking   acyclovir (ZOVIRAX) 400 MG tablet Take 400 mg by mouth 2 (two) times daily.  amLODipine  (NORVASC ) 10 MG tablet Take 1 tablet (10 mg total) by mouth daily. 30 tablet 0    aspirin  EC 81 MG tablet Take 81 mg by mouth daily. Swallow whole.      atorvastatin  (LIPITOR) 40 MG tablet atorvastatin  40 mg tablet      budesonide (ENTOCORT EC) 3 MG 24 hr capsule Take 9 mg by mouth daily.      cetirizine (ZYRTEC) 10 MG tablet Take 10 mg by mouth daily. Barkley & Sheryle      donepezil  (ARICEPT ) 10 MG tablet Take 10 mg by mouth at bedtime.      fluorouracil  (EFUDEX ) 5 % cream Apply to scalp twice a day x 10 days, then stop 30 g 1    hydrochlorothiazide (HYDRODIURIL) 25 MG tablet  hydrochlorothiazide 25 mg tablet      ketoconazole  (NIZORAL ) 2 % cream Apply 1 Application topically at bedtime. Qhs to feet 60 g 6    mupirocin  ointment (BACTROBAN ) 2 % Apply 1 Application topically daily. 22 g 0    omeprazole (PRILOSEC) 40 MG capsule omeprazole 40 mg capsule,delayed release      valsartan (DIOVAN) 80 MG tablet Take 80 mg by mouth daily.      Allergies: No Known Allergies  History reviewed. No pertinent family history. Social History:  reports that he has been smoking cigarettes. He has a 10 pack-year smoking history. He has never used smokeless tobacco. He reports current alcohol use. He reports that he does not use drugs.  ROS: A complete review of systems was performed.  All systems are negative except for pertinent findings as noted. Review of Systems  All other systems reviewed and are negative.    Physical Exam:  Vital signs in last 24 hours: Temp:  [97.3 F (36.3 C)-103 F (39.4 C)] 97.3 F (36.3 C) (08/30 1626) Pulse Rate:  [63-114] 63 (08/30 1626) Resp:  [17-22] 17 (08/30 1626) BP: (133-163)/(61-128) 133/61 (08/30 1626) SpO2:  [91 %-97 %] 97 % (08/30 1626) Weight:  [116.6 kg] 116.6 kg (08/30 0417) General:  Alert and oriented, No acute distress HEENT: Normocephalic, atraumatic Cardiovascular: Regular rate and rhythm Lungs: Regular rate and effort Abdomen: Soft, nontender, nondistended, no abdominal masses Back: No CVA tenderness Extremities: No edema Neurologic: Grossly intact  Laboratory Data:  Results for orders placed or performed during the hospital encounter of 10/14/23 (from the past 24 hours)  CBG monitoring, ED     Status: Abnormal   Collection Time: 10/14/23  8:06 PM  Result Value Ref Range   Glucose-Capillary 138 (H) 70 - 99 mg/dL  CBC with Differential     Status: Abnormal   Collection Time: 10/14/23  8:12 PM  Result Value Ref Range   WBC 15.2 (H) 4.0 - 10.5 K/uL   RBC 4.46 4.22 - 5.81 MIL/uL   Hemoglobin 14.8 13.0 - 17.0 g/dL    HCT 55.8 60.9 - 47.9 %   MCV 98.9 80.0 - 100.0 fL   MCH 33.2 26.0 - 34.0 pg   MCHC 33.6 30.0 - 36.0 g/dL   RDW 86.9 88.4 - 84.4 %   Platelets 276 150 - 400 K/uL   nRBC 0.0 0.0 - 0.2 %   Neutrophils Relative % 78 %   Neutro Abs 12.0 (H) 1.7 - 7.7 K/uL   Lymphocytes Relative 10 %   Lymphs Abs 1.4 0.7 - 4.0 K/uL   Monocytes Relative 9 %   Monocytes Absolute 1.3 (H) 0.1 - 1.0 K/uL   Eosinophils Relative 0 %  Eosinophils Absolute 0.0 0.0 - 0.5 K/uL   Basophils Relative 1 %   Basophils Absolute 0.1 0.0 - 0.1 K/uL   Immature Granulocytes 2 %   Abs Immature Granulocytes 0.29 (H) 0.00 - 0.07 K/uL  Lactic acid, plasma     Status: Abnormal   Collection Time: 10/14/23  8:12 PM  Result Value Ref Range   Lactic Acid, Venous 3.1 (HH) 0.5 - 1.9 mmol/L  Comprehensive metabolic panel     Status: Abnormal   Collection Time: 10/14/23  8:12 PM  Result Value Ref Range   Sodium 137 135 - 145 mmol/L   Potassium 3.8 3.5 - 5.1 mmol/L   Chloride 100 98 - 111 mmol/L   CO2 23 22 - 32 mmol/L   Glucose, Bld 132 (H) 70 - 99 mg/dL   BUN 25 (H) 8 - 23 mg/dL   Creatinine, Ser 9.07 0.61 - 1.24 mg/dL   Calcium  9.9 8.9 - 10.3 mg/dL   Total Protein 8.2 (H) 6.5 - 8.1 g/dL   Albumin 4.2 3.5 - 5.0 g/dL   AST 26 15 - 41 U/L   ALT 29 0 - 44 U/L   Alkaline Phosphatase 55 38 - 126 U/L   Total Bilirubin 1.0 0.0 - 1.2 mg/dL   GFR, Estimated >39 >39 mL/min   Anion gap 14 5 - 15  Troponin I (High Sensitivity)     Status: Abnormal   Collection Time: 10/14/23  8:12 PM  Result Value Ref Range   Troponin I (High Sensitivity) 18 (H) <18 ng/L  Blood Culture (routine x 2)     Status: None (Preliminary result)   Collection Time: 10/14/23  8:12 PM   Specimen: BLOOD  Result Value Ref Range   Specimen Description BLOOD BLOOD LEFT FOREARM    Special Requests      BOTTLES DRAWN AEROBIC AND ANAEROBIC Blood Culture results may not be optimal due to an inadequate volume of blood received in culture bottles   Culture      NO  GROWTH < 12 HOURS Performed at [facility], [address] [place], [place] [address]    Report Status PENDING   Brain natriuretic peptide     Status: None   Collection Time: 10/14/23  8:12 PM  Result Value Ref Range   B Natriuretic Peptide 51.3 0.0 - 100.0 pg/mL  Resp panel by RT-PCR (RSV, Flu A&B, Covid) Anterior Nasal Swab     Status: None   Collection Time: 10/14/23  8:45 PM   Specimen: Anterior Nasal Swab  Result Value Ref Range   SARS Coronavirus 2 by RT PCR NEGATIVE NEGATIVE   Influenza A by PCR NEGATIVE NEGATIVE   Influenza B by PCR NEGATIVE NEGATIVE   Resp Syncytial Virus by PCR NEGATIVE NEGATIVE  Blood Culture (routine x 2)     Status: None (Preliminary result)   Collection Time: 10/14/23  8:55 PM   Specimen: BLOOD  Result Value Ref Range   Specimen Description BLOOD BLOOD LEFT ARM    Special Requests      BOTTLES DRAWN AEROBIC AND ANAEROBIC Blood Culture adequate volume   Culture      NO GROWTH < 12 HOURS Performed at [facility], [address] [place], [place] [address]    Report Status PENDING   Lactic acid, plasma     Status: Abnormal   Collection Time: 10/14/23  9:38 PM  Result Value Ref Range   Lactic Acid, Venous 2.5 (HH) 0.5 - 1.9 mmol/L  Urinalysis, w/ Reflex to Culture (Infection Suspected) -Urine, Catheterized     Status: Abnormal   Collection Time: 10/14/23 10:36 PM  Result Value Ref Range   Specimen Source URINE, CATHETERIZED    Color, Urine YELLOW (A) YELLOW   APPearance HAZY (A) CLEAR   Specific Gravity, Urine 1.016 1.005 - 1.030   pH 8.0 5.0 - 8.0   Glucose, UA NEGATIVE NEGATIVE mg/dL   Hgb urine dipstick SMALL (A) NEGATIVE   Bilirubin Urine NEGATIVE NEGATIVE   Ketones, ur NEGATIVE NEGATIVE mg/dL   Protein, ur 30 (A) NEGATIVE mg/dL   Nitrite NEGATIVE NEGATIVE   Leukocytes,Ua LARGE (A) NEGATIVE   RBC / HPF 0-5 0 - 5 RBC/hpf   WBC, UA >50 0 - 5 WBC/hpf   Bacteria, UA RARE (A) NONE SEEN   Squamous Epithelial / HPF 0 0 -  5 /HPF   Mucus PRESENT   Troponin I (High Sensitivity)     Status: Abnormal   Collection Time: 10/14/23 10:36 PM  Result Value Ref Range   Troponin I (High Sensitivity) 18 (H) <18 ng/L  Glucose, capillary     Status: Abnormal   Collection Time: 10/15/23 12:34 AM  Result Value Ref Range   Glucose-Capillary 127 (H) 70 - 99 mg/dL   Comment 1 Notify RN   Lactic acid, plasma     Status: Abnormal   Collection Time: 10/15/23 12:36 AM  Result Value Ref Range   Lactic Acid, Venous 3.0 (HH) 0.5 - 1.9 mmol/L  Lactic acid, plasma     Status: None   Collection Time: 10/15/23  6:27 AM  Result Value Ref Range   Lactic Acid, Venous 1.3 0.5 - 1.9 mmol/L  Basic metabolic panel     Status: Abnormal   Collection Time: 10/15/23  6:27 AM  Result Value Ref Range   Sodium 136 135 - 145 mmol/L   Potassium 3.6 3.5 - 5.1 mmol/L   Chloride 101 98 - 111 mmol/L   CO2 25 22 - 32 mmol/L   Glucose, Bld 171 (H) 70 - 99 mg/dL   BUN 16 8 - 23 mg/dL   Creatinine, Ser 9.19 0.61 - 1.24 mg/dL   Calcium  9.0 8.9 - 10.3 mg/dL   GFR, Estimated >39 >39 mL/min   Anion gap 10 5 - 15  CBC     Status: Abnormal   Collection Time: 10/15/23  6:27 AM  Result Value Ref Range   WBC 15.3 (H) 4.0 - 10.5 K/uL   RBC 3.94 (L) 4.22 - 5.81 MIL/uL   Hemoglobin 13.2 13.0 - 17.0 g/dL   HCT 61.6 (L) 60.9 - 47.9 %   MCV 97.2 80.0 - 100.0 fL   MCH 33.5 26.0 - 34.0 pg   MCHC 34.5 30.0 - 36.0 g/dL   RDW 87.0 88.4 - 84.4 %   Platelets 217 150 - 400 K/uL   nRBC 0.0 0.0 - 0.2 %  Glucose, capillary     Status: Abnormal   Collection Time: 10/15/23  8:09 AM  Result Value Ref Range   Glucose-Capillary 156 (H) 70 - 99 mg/dL  Glucose, capillary     Status: Abnormal   Collection Time: 10/15/23  1:44 PM  Result Value Ref Range   Glucose-Capillary 173 (H) 70 - 99 mg/dL   Recent Results (from the past 240 hours)  Blood Culture (routine x 2)     Status: None (Preliminary result)   Collection Time: 10/14/23  8:12 PM   Specimen: BLOOD  Result  Value  Ref Range Status   Specimen Description BLOOD BLOOD LEFT FOREARM  Final   Special Requests   Final    BOTTLES DRAWN AEROBIC AND ANAEROBIC Blood Culture results may not be optimal due to an inadequate volume of blood received in culture bottles   Culture   Final    NO GROWTH < 12 HOURS Performed at [facility], [address], [place], [place] [address]    Report Status PENDING  Incomplete  Resp panel by RT-PCR (RSV, Flu A&B, Covid) Anterior Nasal Swab     Status: None   Collection Time: 10/14/23  8:45 PM   Specimen: Anterior Nasal Swab  Result Value Ref Range Status   SARS Coronavirus 2 by RT PCR NEGATIVE NEGATIVE Final    Comment: (NOTE) SARS-CoV-2 target nucleic acids are NOT DETECTED.  The SARS-CoV-2 RNA is generally detectable in upper respiratory specimens during the acute phase of infection. The lowest concentration of SARS-CoV-2 viral copies this assay can detect is 138 copies/mL. A negative result does not preclude SARS-Cov-2 infection and should not be used as the sole basis for treatment or other patient management decisions. A negative result may occur with  improper specimen collection/handling, submission of specimen other than nasopharyngeal swab, presence of viral mutation(s) within the areas targeted by this assay, and inadequate number of viral copies(<138 copies/mL). A negative result must be combined with clinical observations, patient history, and epidemiological information. The expected result is Negative.  Fact Sheet for Patients:  BloggerCourse.com  Fact Sheet for Healthcare Providers:  SeriousBroker.it  This test is no t yet approved or cleared by the [place]  FDA and  has been authorized for detection and/or diagnosis of SARS-CoV-2 by FDA under an Emergency Use Authorization (EUA). This EUA will remain  in effect (meaning this test can be used) for the duration of the COVID-19  declaration under Section 564(b)(1) of the Act, 21 U.S.C.section 360bbb-3(b)(1), unless the authorization is terminated  or revoked sooner.       Influenza A by PCR NEGATIVE NEGATIVE Final   Influenza B by PCR NEGATIVE NEGATIVE Final    Comment: (NOTE) The Xpert Xpress SARS-CoV-2/FLU/RSV plus assay is intended as an aid in the diagnosis of influenza from Nasopharyngeal swab specimens and should not be used as a sole basis for treatment. Nasal washings and aspirates are unacceptable for Xpert Xpress SARS-CoV-2/FLU/RSV testing.  Fact Sheet for Patients: BloggerCourse.com  Fact Sheet for Healthcare Providers: SeriousBroker.it  This test is not yet approved or cleared by the [place]  FDA and has been authorized for detection and/or diagnosis of SARS-CoV-2 by FDA under an Emergency Use Authorization (EUA). This EUA will remain in effect (meaning this test can be used) for the duration of the COVID-19 declaration under Section 564(b)(1) of the Act, 21 U.S.C. section 360bbb-3(b)(1), unless the authorization is terminated or revoked.     Resp Syncytial Virus by PCR NEGATIVE NEGATIVE Final    Comment: (NOTE) Fact Sheet for Patients: BloggerCourse.com  Fact Sheet for Healthcare Providers: SeriousBroker.it  This test is not yet approved or cleared by the [place]  FDA and has been authorized for detection and/or diagnosis of SARS-CoV-2 by FDA under an Emergency Use Authorization (EUA). This EUA will remain in effect (meaning this test can be used) for the duration of the COVID-19 declaration under Section 564(b)(1) of the Act, 21 U.S.C. section 360bbb-3(b)(1), unless the authorization is terminated or revoked.  Performed at [facility], [address], [place], [place] [address]  Blood Culture (routine x 2)     Status: None (Preliminary result)   Collection  Time: 10/14/23  8:55 PM   Specimen: BLOOD  Result Value Ref Range Status   Specimen Description BLOOD BLOOD LEFT ARM  Final   Special Requests   Final    BOTTLES DRAWN AEROBIC AND ANAEROBIC Blood Culture adequate volume   Culture   Final    NO GROWTH < 12 HOURS Performed at [facility], [address] [place], [place] [address]    Report Status PENDING  Incomplete   Creatinine: Recent Labs    10/14/23 2012 10/15/23 0627  CREATININE 0.92 0.80    Impression/Assessment:  Right ureteral stone with sepsis that has resolved.  He is in a unique position because he has been resuscitated and now has stable vitals.  White count slightly elevated.  He has had no flank pain and is tolerating a regular diet.  A lot of his confusion is cleared and he is back to baseline and alert and oriented.  Plan:  I had a long discussion with the patient and his wife on the nature risks and benefits of continued medical management with medical expulsion therapy versus placing a ureteral stent on the right.  We discussed if he does not get the stent placed he could get repeat episodes of pain fever or confusion but may be able to avoid a urologic procedure.  On the other hand a stent should prevent any further obstruction or pain from the stone but then he will be obligated more for a right ureteroscopy and laser lithotripsy in the future which at that time the right renal stone could also be addressed.  We discussed the risk of stents with infection, bleeding and pain among others and the rationale for simply placing a stent with a right proximal stone in his presentation and not doing ureteroscopy at the present time.  They would like to continue medical expulsion therapy and passed the stone, but they would like to discuss further and possibly plan for a stent tomorrow. We discussed the importance of follow-up if he has a stent.  If he does go home without a stent, recommend tamsulosin , pain medicine,  antibiotics for 7 to 10 days. I encouraged the patient to stay hydrated.   ADD: Spoke with his wife and patient two more times and discussed things. They elected to proceed with the right stent to plan URS/laser in the future. Post for OR - will be after lunch tomorrow unless a second room opens.   Donnice Brooks 10/15/2023, 4:38 PM

## 2023-10-15 NOTE — Plan of Care (Signed)

## 2023-10-15 NOTE — Consult Note (Addendum)
 Jason Washington is a 78 y.o. male  [id number]  Primary Cardiologist: Denyse Bathe Reason for Consultation: AV block  HPI: This is a 78 year old white male who has 3 mm right ureteral stone with hydronephrosis and history of meningioma presented to the hospital with confusion and altered mental status.  Currently patient was asked to his name and name of his wife as well as location and was able to answer those questions and appears to be quite alert.  He is not aware of time of the day and year.  When he presented to the hospital at that time he was very confused.  He apparently had 103 temperature and was found to have UTI with possible sepsis.  I was asked to evaluate the patient because apparently he was bradycardic and intermittently having atrial fibrillation with possibly second-degree AV block.  Currently he is more lucid and denies any chest pain palpitation or shortness of breath or dizziness.  No history of syncope.   Review of Systems: No chest pain   Past Medical History:  Diagnosis Date   Actinic keratosis    Basal cell carcinoma 11/21/2019   right superior lat pectoral/ EDC   Basal cell carcinoma 08/12/2022   Right sideburn. Mohs pending.   Chronic GERD    Diabetes mellitus without complication (HCC)    Genital herpes    Hepatic steatosis    HLD (hyperlipidemia)    Hypertension    Imbalance    Melanoma (HCC)    R knee several years ago    Mild cognitive impairment with memory loss    Neuropathy    Prostate CA (HCC)    Sleep apnea    Squamous cell carcinoma of skin 12/22/2021   left forearm-anterior treated wiht EDC   Thoracic aortic aneurysm (HCC)     Medications Prior to Admission  Medication Sig Dispense Refill   acyclovir (ZOVIRAX) 400 MG tablet Take 400 mg by mouth 2 (two) times daily.     amLODipine  (NORVASC ) 10 MG tablet Take 1 tablet (10 mg total) by mouth daily. 30 tablet 0   aspirin  EC 81 MG tablet Take 81 mg by mouth daily. Swallow whole.      atorvastatin  (LIPITOR) 40 MG tablet atorvastatin  40 mg tablet     budesonide (ENTOCORT EC) 3 MG 24 hr capsule Take 9 mg by mouth daily.     cetirizine (ZYRTEC) 10 MG tablet Take 10 mg by mouth daily. Barkley & Sheryle     donepezil  (ARICEPT ) 10 MG tablet Take 10 mg by mouth at bedtime.     fluorouracil  (EFUDEX ) 5 % cream Apply to scalp twice a day x 10 days, then stop 30 g 1   hydrochlorothiazide (HYDRODIURIL) 25 MG tablet hydrochlorothiazide 25 mg tablet     ketoconazole  (NIZORAL ) 2 % cream Apply 1 Application topically at bedtime. Qhs to feet 60 g 6   mupirocin  ointment (BACTROBAN ) 2 % Apply 1 Application topically daily. 22 g 0   omeprazole (PRILOSEC) 40 MG capsule omeprazole 40 mg capsule,delayed release     valsartan (DIOVAN) 80 MG tablet Take 80 mg by mouth daily.        amLODipine   5 mg Oral Daily   aspirin  EC  81 mg Oral Daily   atorvastatin   40 mg Oral Daily   dexamethasone  (DECADRON ) injection  4 mg Intravenous Q12H   heparin   5,000 Units Subcutaneous Q8H   insulin  aspart  0-5 Units Subcutaneous QHS   insulin  aspart  0-9 Units Subcutaneous TID WC   nicotine   21 mg Transdermal Daily   pantoprazole   40 mg Oral Daily    Infusions:  cefTRIAXone  (ROCEPHIN )  IV 200 mL/hr at 10/15/23 9340   lactated ringers  100 mL/hr at 10/15/23 0036    No Known Allergies  Social History   Socioeconomic History   Marital status: Married    Spouse name: Not on file   Number of children: Not on file   Years of education: Not on file   Highest education level: Not on file  Occupational History   Not on file  Tobacco Use   Smoking status: Every Day    Current packs/day: 1.00    Average packs/day: 1 pack/day for 10.0 years (10.0 ttl pk-yrs)    Types: Cigarettes   Smokeless tobacco: Never   Tobacco comments:    2 cigs daily- 10/23/2019  Vaping Use   Vaping status: Never Used  Substance and Sexual Activity   Alcohol use: Yes    Comment: rare   Drug use: Never   Sexual activity: Not  on file  Other Topics Concern   Not on file  Social History Narrative   Not on file   Social Drivers of Health   Financial Resource Strain: Low Risk  (07/24/2023)   Received from [facility]   Overall Financial Resource Strain (CARDIA)    Difficulty of Paying Living Expenses: Not hard at all  Food Insecurity: No Food Insecurity (10/15/2023)   Hunger Vital Sign    Worried About Running Out of Food in the Last Year: Never true    Ran Out of Food in the Last Year: Never true  Transportation Needs: No Transportation Needs (10/15/2023)   PRAPARE - Administrator, Civil Service (Medical): No    Lack of Transportation (Non-Medical): No  Physical Activity: Not on file  Stress: Not on file  Social Connections: Unknown (10/15/2023)   Social Connection and Isolation Panel    Frequency of Communication with Friends and Family: Never    Frequency of Social Gatherings with Friends and Family: Never    Attends Religious Services: Patient unable to answer    Active Member of Clubs or Organizations: Patient unable to answer    Attends Banker Meetings: Patient unable to answer    Marital Status: Patient unable to answer  Intimate Partner Violence: Not At Risk (10/15/2023)   Humiliation, Afraid, Rape, and Kick questionnaire    Fear of Current or Ex-Partner: No    Emotionally Abused: No    Physically Abused: No    Sexually Abused: No    History reviewed. No pertinent family history.  PHYSICAL EXAM: Vitals:   10/15/23 0757 10/15/23 1626  BP: (!) 141/70 133/61  Pulse: 77 63  Resp: 17 17  Temp: 98.2 F (36.8 C) (!) 97.3 F (36.3 C)  SpO2: 94% 97%     Intake/Output Summary (Last 24 hours) at 10/15/2023 1814 Last data filed at 10/15/2023 0659 Gross per 24 hour  Intake 4341.43 ml  Output --  Net 4341.43 ml    General:  Well appearing. No respiratory difficulty HEENT: normal Neck: supple. no JVD. Carotids 2+ bilat; no bruits. No lymphadenopathy  or thryomegaly appreciated. Cor: PMI nondisplaced. Regular rate & rhythm. No rubs, gallops or murmurs. Lungs: clear Abdomen: soft, nontender, nondistended. No hepatosplenomegaly. No bruits or masses. Good bowel sounds. Extremities: no cyanosis, clubbing, rash, edema Neuro: alert & oriented x 3, cranial nerves grossly intact. moves  all 4 extremities w/o difficulty. Affect pleasant.  ECG: Sinus bradycardia with some EKG here showing first-degree AV block and another EKG showing type 2 second-degree AV block.  Monitor shows intermittent 2 pauses about  1.5 seconds with type 2 second-degree AV block.  Currently on monitor patient is in sinus rhythm with heart rate 57.  One of the EKGs computer read as A-fib but it was actually second-degree AV block with sinus rhythm.  Results for orders placed or performed during the hospital encounter of 10/14/23 (from the past 24 hours)  CBG monitoring, ED     Status: Abnormal   Collection Time: 10/14/23  8:06 PM  Result Value Ref Range   Glucose-Capillary 138 (H) 70 - 99 mg/dL  CBC with Differential     Status: Abnormal   Collection Time: 10/14/23  8:12 PM  Result Value Ref Range   WBC 15.2 (H) 4.0 - 10.5 K/uL   RBC 4.46 4.22 - 5.81 MIL/uL   Hemoglobin 14.8 13.0 - 17.0 g/dL   HCT 55.8 60.9 - 47.9 %   MCV 98.9 80.0 - 100.0 fL   MCH 33.2 26.0 - 34.0 pg   MCHC 33.6 30.0 - 36.0 g/dL   RDW 86.9 88.4 - 84.4 %   Platelets 276 150 - 400 K/uL   nRBC 0.0 0.0 - 0.2 %   Neutrophils Relative % 78 %   Neutro Abs 12.0 (H) 1.7 - 7.7 K/uL   Lymphocytes Relative 10 %   Lymphs Abs 1.4 0.7 - 4.0 K/uL   Monocytes Relative 9 %   Monocytes Absolute 1.3 (H) 0.1 - 1.0 K/uL   Eosinophils Relative 0 %   Eosinophils Absolute 0.0 0.0 - 0.5 K/uL   Basophils Relative 1 %   Basophils Absolute 0.1 0.0 - 0.1 K/uL   Immature Granulocytes 2 %   Abs Immature Granulocytes 0.29 (H) 0.00 - 0.07 K/uL  Lactic acid, plasma     Status: Abnormal   Collection Time: 10/14/23  8:12 PM   Result Value Ref Range   Lactic Acid, Venous 3.1 (HH) 0.5 - 1.9 mmol/L  Comprehensive metabolic panel     Status: Abnormal   Collection Time: 10/14/23  8:12 PM  Result Value Ref Range   Sodium 137 135 - 145 mmol/L   Potassium 3.8 3.5 - 5.1 mmol/L   Chloride 100 98 - 111 mmol/L   CO2 23 22 - 32 mmol/L   Glucose, Bld 132 (H) 70 - 99 mg/dL   BUN 25 (H) 8 - 23 mg/dL   Creatinine, Ser 9.07 0.61 - 1.24 mg/dL   Calcium  9.9 8.9 - 10.3 mg/dL   Total Protein 8.2 (H) 6.5 - 8.1 g/dL   Albumin 4.2 3.5 - 5.0 g/dL   AST 26 15 - 41 U/L   ALT 29 0 - 44 U/L   Alkaline Phosphatase 55 38 - 126 U/L   Total Bilirubin 1.0 0.0 - 1.2 mg/dL   GFR, Estimated >39 >39 mL/min   Anion gap 14 5 - 15  Troponin I (High Sensitivity)     Status: Abnormal   Collection Time: 10/14/23  8:12 PM  Result Value Ref Range   Troponin I (High Sensitivity) 18 (H) <18 ng/L  Blood Culture (routine x 2)     Status: None (Preliminary result)   Collection Time: 10/14/23  8:12 PM   Specimen: BLOOD  Result Value Ref Range   Specimen Description BLOOD BLOOD LEFT FOREARM    Special Requests  BOTTLES DRAWN AEROBIC AND ANAEROBIC Blood Culture results may not be optimal due to an inadequate volume of blood received in culture bottles   Culture      NO GROWTH < 12 HOURS Performed at [facility], [address] [place], [place] [address]    Report Status PENDING   Brain natriuretic peptide     Status: None   Collection Time: 10/14/23  8:12 PM  Result Value Ref Range   B Natriuretic Peptide 51.3 0.0 - 100.0 pg/mL  Resp panel by RT-PCR (RSV, Flu A&B, Covid) Anterior Nasal Swab     Status: None   Collection Time: 10/14/23  8:45 PM   Specimen: Anterior Nasal Swab  Result Value Ref Range   SARS Coronavirus 2 by RT PCR NEGATIVE NEGATIVE   Influenza A by PCR NEGATIVE NEGATIVE   Influenza B by PCR NEGATIVE NEGATIVE   Resp Syncytial Virus by PCR NEGATIVE NEGATIVE  Blood Culture (routine x 2)     Status: None  (Preliminary result)   Collection Time: 10/14/23  8:55 PM   Specimen: BLOOD  Result Value Ref Range   Specimen Description BLOOD BLOOD LEFT ARM    Special Requests      BOTTLES DRAWN AEROBIC AND ANAEROBIC Blood Culture adequate volume   Culture      NO GROWTH < 12 HOURS Performed at [facility], [address] [place], [place] [address]    Report Status PENDING   Lactic acid, plasma     Status: Abnormal   Collection Time: 10/14/23  9:38 PM  Result Value Ref Range   Lactic Acid, Venous 2.5 (HH) 0.5 - 1.9 mmol/L  Urinalysis, w/ Reflex to Culture (Infection Suspected) -Urine, Catheterized     Status: Abnormal   Collection Time: 10/14/23 10:36 PM  Result Value Ref Range   Specimen Source URINE, CATHETERIZED    Color, Urine YELLOW (A) YELLOW   APPearance HAZY (A) CLEAR   Specific Gravity, Urine 1.016 1.005 - 1.030   pH 8.0 5.0 - 8.0   Glucose, UA NEGATIVE NEGATIVE mg/dL   Hgb urine dipstick SMALL (A) NEGATIVE   Bilirubin Urine NEGATIVE NEGATIVE   Ketones, ur NEGATIVE NEGATIVE mg/dL   Protein, ur 30 (A) NEGATIVE mg/dL   Nitrite NEGATIVE NEGATIVE   Leukocytes,Ua LARGE (A) NEGATIVE   RBC / HPF 0-5 0 - 5 RBC/hpf   WBC, UA >50 0 - 5 WBC/hpf   Bacteria, UA RARE (A) NONE SEEN   Squamous Epithelial / HPF 0 0 - 5 /HPF   Mucus PRESENT   Troponin I (High Sensitivity)     Status: Abnormal   Collection Time: 10/14/23 10:36 PM  Result Value Ref Range   Troponin I (High Sensitivity) 18 (H) <18 ng/L  Glucose, capillary     Status: Abnormal   Collection Time: 10/15/23 12:34 AM  Result Value Ref Range   Glucose-Capillary 127 (H) 70 - 99 mg/dL   Comment 1 Notify RN   Lactic acid, plasma     Status: Abnormal   Collection Time: 10/15/23 12:36 AM  Result Value Ref Range   Lactic Acid, Venous 3.0 (HH) 0.5 - 1.9 mmol/L  Lactic acid, plasma     Status: None   Collection Time: 10/15/23  6:27 AM  Result Value Ref Range   Lactic Acid, Venous 1.3 0.5 - 1.9 mmol/L  Basic metabolic panel      Status: Abnormal   Collection Time: 10/15/23  6:27 AM  Result Value Ref Range  Sodium 136 135 - 145 mmol/L   Potassium 3.6 3.5 - 5.1 mmol/L   Chloride 101 98 - 111 mmol/L   CO2 25 22 - 32 mmol/L   Glucose, Bld 171 (H) 70 - 99 mg/dL   BUN 16 8 - 23 mg/dL   Creatinine, Ser 9.19 0.61 - 1.24 mg/dL   Calcium  9.0 8.9 - 10.3 mg/dL   GFR, Estimated >39 >39 mL/min   Anion gap 10 5 - 15  CBC     Status: Abnormal   Collection Time: 10/15/23  6:27 AM  Result Value Ref Range   WBC 15.3 (H) 4.0 - 10.5 K/uL   RBC 3.94 (L) 4.22 - 5.81 MIL/uL   Hemoglobin 13.2 13.0 - 17.0 g/dL   HCT 61.6 (L) 60.9 - 47.9 %   MCV 97.2 80.0 - 100.0 fL   MCH 33.5 26.0 - 34.0 pg   MCHC 34.5 30.0 - 36.0 g/dL   RDW 87.0 88.4 - 84.4 %   Platelets 217 150 - 400 K/uL   nRBC 0.0 0.0 - 0.2 %  Glucose, capillary     Status: Abnormal   Collection Time: 10/15/23  8:09 AM  Result Value Ref Range   Glucose-Capillary 156 (H) 70 - 99 mg/dL  Glucose, capillary     Status: Abnormal   Collection Time: 10/15/23  1:44 PM  Result Value Ref Range   Glucose-Capillary 173 (H) 70 - 99 mg/dL  Glucose, capillary     Status: Abnormal   Collection Time: 10/15/23  4:37 PM  Result Value Ref Range   Glucose-Capillary 220 (H) 70 - 99 mg/dL   CT RENAL STONE STUDY Result Date: 10/15/2023 CLINICAL DATA:  Initial evaluation for acute abdominal/flank pain. EXAM: CT ABDOMEN AND PELVIS WITHOUT CONTRAST TECHNIQUE: Multidetector CT imaging of the abdomen and pelvis was performed following the standard protocol without IV contrast. RADIATION DOSE REDUCTION: This exam was performed according to the departmental dose-optimization program which includes automated exposure control, adjustment of the mA and/or kV according to patient size and/or use of iterative reconstruction technique. COMPARISON:  None Available. FINDINGS: Lower chest: Few scattered pulmonary nodules noted about the visualized lung bases, largest of which measures 7 mm at the subpleural  peripheral left lower lobe (series 4, image 18). Hepatobiliary: Multiple scattered hypodensities noted within the liver, incompletely characterized on this noncontrast examination, but favored to reflect small cysts. Gallbladder within normal limits. No biliary dilatation. Pancreas: Pancreas within normal limits. Previously noted subcentimeter pancreatic cyst not well seen on this noncontrast examination. Spleen: Spleen within normal limits. Adrenals/Urinary Tract: Mild diffuse thickening of the adrenal glands without discrete lesion. Focal calcification at the left adrenal gland noted as well. Left kidney within normal limits without nephrolithiasis or hydronephrosis. No stone seen along the left renal collecting system. No left-sided hydroureter. On the right, there is a 4 mm obstructive stone within the mid right ureter with secondary mild right hydroureteronephrosis. Associated perinephric and periureteral fat stranding. Few additional nonobstructive right renal calculi measuring up to 7 mm noted. Few scattered small renal cysts noted, a few of which demonstrate intermediate density. These were seen and evaluated on prior MRI from 07/24/2022, demonstrated benign at that time. Bladder is largely decompressed no layering stones within the bladder lumen. Stomach/Bowel: Small hiatal hernia noted. Stomach within normal limits. No evidence for bowel obstruction. No acute inflammatory changes seen about the bowels. Vascular/Lymphatic: Moderate aorto bi-iliac atherosclerotic disease. No enlarged intra-abdominopelvic lymph nodes. Reproductive: Prostate appears to be absent. Other: No free  air or fluid. Small bilateral fat containing inguinal hernias without inflammation. Musculoskeletal: No discrete or worrisome osseous lesions. Mild multilevel spondylosis within the visualized thoracolumbar spine. IMPRESSION: 1. 4 mm obstructive stone within the mid right ureter with secondary mild right hydroureteronephrosis. 2. Few  additional nonobstructive right renal calculi measuring up to 7 mm 3. Few scattered pulmonary nodules about the visualized lung bases, largest of which measures 7 mm at the left lower lobe. Per Fleischner Society Guidelines, recommend a non-contrast Chest CT at 3-6 months, then consider another non-contrast Chest CT at 18-24 months. If patient is low risk for malignancy, non-contrast Chest CT at 18-24 months is optional. These guidelines do not apply to immunocompromised patients and patients with cancer. Follow up in patients with significant comorbidities as clinically warranted. For lung cancer screening, adhere to Lung-RADS guidelines. Reference: Radiology. 2017; 284(1):228-43. Aortic Atherosclerosis (ICD10-I70.0). Electronically Signed   By: Morene Hoard M.D.   On: 10/15/2023 03:47   CT Head Wo Contrast Result Date: 10/14/2023 EXAM: CT HEAD WITHOUT CONTRAST 10/14/2023 10:07:34 PM TECHNIQUE: CT of the head was performed without the administration of intravenous contrast. Automated exposure control, iterative reconstruction, and/or weight based adjustment of the mA/kV was utilized to reduce the radiation dose to as low as reasonably achievable. COMPARISON: Head CT and brain MRI 05/24/2023. CLINICAL HISTORY: Mental status change, unknown cause. Alert and confused. History of meningioma. Febrile with confusion in triage. FINDINGS: BRAIN AND VENTRICLES: Stable appearance of densely calcified mass in the left middle cranial fossa with adjacent mass effect and vasogenic edema. Peripheral cystic component is grossly unchanged in CT appearance. There is 5 mm rightward midline shift of the lateral ventricles, unchanged. Cystic focus in the left aspect of the posterior fossa is unchanged, possible arachnoid cyst. No evidence of acute ischemia, allowing for background chronic change. No acute intracranial hemorrhage. ORBITS: No acute abnormality. SINUSES: Mixed density mucous retention cyst in the left maxillary  sinus, unchanged. SOFT TISSUES AND SKULL: No acute soft tissue abnormality. No skull fracture. IMPRESSION: 1. Stable appearance of densely calcified mass in the left middle cranial fossa with adjacent mass effect and vasogenic edema, and 5 mm rightward midline shift of the lateral ventricles. 2. No acute intracranial abnormality. Electronically signed by: Andrea Gasman MD 10/14/2023 10:40 PM EDT RP Workstation: [id number]   DG Chest Port 1 View Result Date: 10/14/2023 EXAM: 1 VIEW XRAY OF THE CHEST 10/14/2023 09:00:16 PM COMPARISON: 05/24/2023 CLINICAL HISTORY: Questionable sepsis - evaluate for abnormality. Febrile with confusion in triage. FINDINGS: LUNGS AND PLEURA: Mild subsegmental atelectasis in the lung bases. No confluent pulmonary opacity. No pulmonary edema. No pleural effusion. No pneumothorax. HEART AND MEDIASTINUM: Cardiomegaly is stable. Unchanged heart size and mediastinal contours. BONES AND SOFT TISSUES: No acute osseous abnormality. IMPRESSION: 1. Mild subsegmental atelectasis in the lung bases. Electronically signed by: Andrea Gasman MD 10/14/2023 09:08 PM EDT RP Workstation: [id number]     ASSESSMENT AND PLAN: Sinus bradycardia with intermittent 1.5-second pauses with type 2 second-degree AV block.  Patient appears to be alert and oriented and does not appear to be symptomatic from it.  However on admission patient was confused but at that time he appeared to be febrile with 103 temperature.  It is unclear of if he is symptomatic due to AV block at this time.  Advise moving the patient  to  telemetry and observe the patient on 2 A.  Patient may require permanent pacemaker implantation if he has significant pauses of up to 3 seconds and continues  to have high degree AV block.  At this time he is not interested in any procedures. May get EP evaluation to further evaluate .  If patient needs stenting of the ureter, since is a low risk procedure advise proceeding as may alleviate his  symptoms.  Thank you very much for referral.  Denyse Bathe

## 2023-10-16 ENCOUNTER — Inpatient Hospital Stay: Admitting: Certified Registered"

## 2023-10-16 ENCOUNTER — Encounter
Admission: EM | Disposition: A | Discharge status: Home Health | Payer: Self-pay | Source: Home / Self Care | Attending: Internal Medicine

## 2023-10-16 ENCOUNTER — Inpatient Hospital Stay
Admit: 2023-10-16 | Discharge: 2023-10-16 | Disposition: A | Discharge status: Home / Self Care | Attending: Internal Medicine | Admitting: Internal Medicine

## 2023-10-16 ENCOUNTER — Inpatient Hospital Stay

## 2023-10-16 ENCOUNTER — Encounter: Payer: Self-pay | Admitting: Internal Medicine

## 2023-10-16 DIAGNOSIS — I471 Supraventricular tachycardia, unspecified: Secondary | ICD-10-CM | POA: Diagnosis not present

## 2023-10-16 DIAGNOSIS — I7121 Aneurysm of the ascending aorta, without rupture: Secondary | ICD-10-CM | POA: Diagnosis not present

## 2023-10-16 DIAGNOSIS — R079 Chest pain, unspecified: Secondary | ICD-10-CM | POA: Diagnosis not present

## 2023-10-16 DIAGNOSIS — I4891 Unspecified atrial fibrillation: Secondary | ICD-10-CM | POA: Diagnosis not present

## 2023-10-16 DIAGNOSIS — A419 Sepsis, unspecified organism: Secondary | ICD-10-CM | POA: Diagnosis not present

## 2023-10-16 DIAGNOSIS — N390 Urinary tract infection, site not specified: Secondary | ICD-10-CM | POA: Diagnosis not present

## 2023-10-16 DIAGNOSIS — N201 Calculus of ureter: Secondary | ICD-10-CM

## 2023-10-16 HISTORY — PX: CYSTOSCOPY WITH STENT PLACEMENT: SHX5790

## 2023-10-16 LAB — BLOOD CULTURE ID PANEL (REFLEXED) - BCID2

## 2023-10-16 LAB — ECHOCARDIOGRAM COMPLETE
AR max vel: 3.02 cm2
AV Peak grad: 9.2 mmHg
Ao pk vel: 1.52 m/s
Area-P 1/2: 3.06 cm2
Height: 71 in
P 1/2 time: 642 ms
S' Lateral: 3.6 cm
Weight: 4112.9 [oz_av]

## 2023-10-16 LAB — CBC WITH DIFFERENTIAL/PLATELET
Abs Immature Granulocytes: 0.1 K/uL — ABNORMAL HIGH (ref 0.00–0.07)
Basophils Absolute: 0 K/uL (ref 0.0–0.1)
Basophils Relative: 0 %
Eosinophils Absolute: 0 K/uL (ref 0.0–0.5)
Eosinophils Relative: 0 %
HCT: 36.6 % — ABNORMAL LOW (ref 39.0–52.0)
Hemoglobin: 12.5 g/dL — ABNORMAL LOW (ref 13.0–17.0)
Immature Granulocytes: 1 %
Lymphocytes Relative: 11 %
Lymphs Abs: 1.3 K/uL (ref 0.7–4.0)
MCH: 33.7 pg (ref 26.0–34.0)
MCHC: 34.2 g/dL (ref 30.0–36.0)
MCV: 98.7 fL (ref 80.0–100.0)
Monocytes Absolute: 1 K/uL (ref 0.1–1.0)
Monocytes Relative: 8 %
Neutro Abs: 9.8 K/uL — ABNORMAL HIGH (ref 1.7–7.7)
Neutrophils Relative %: 80 %
Platelets: 209 K/uL (ref 150–400)
RBC: 3.71 MIL/uL — ABNORMAL LOW (ref 4.22–5.81)
RDW: 12.8 % (ref 11.5–15.5)
WBC: 12.2 K/uL — ABNORMAL HIGH (ref 4.0–10.5)
nRBC: 0 % (ref 0.0–0.2)

## 2023-10-16 LAB — GLUCOSE, CAPILLARY
Glucose-Capillary: 148 mg/dL — ABNORMAL HIGH (ref 70–99)
Glucose-Capillary: 124 mg/dL — ABNORMAL HIGH (ref 70–99)
Glucose-Capillary: 104 mg/dL — ABNORMAL HIGH (ref 70–99)
Glucose-Capillary: 118 mg/dL — ABNORMAL HIGH (ref 70–99)
Glucose-Capillary: 120 mg/dL — ABNORMAL HIGH (ref 70–99)

## 2023-10-16 LAB — URINE CULTURE: Culture: NO GROWTH

## 2023-10-16 LAB — MAGNESIUM: Magnesium: 1.9 mg/dL (ref 1.7–2.4)

## 2023-10-16 LAB — BASIC METABOLIC PANEL WITH GFR
Anion gap: 7 (ref 5–15)
BUN: 19 mg/dL (ref 8–23)
CO2: 27 mmol/L (ref 22–32)
Calcium: 9 mg/dL (ref 8.9–10.3)
Chloride: 104 mmol/L (ref 98–111)
Creatinine, Ser: 0.72 mg/dL (ref 0.61–1.24)
GFR, Estimated: >60 mL/min (ref >60)
Glucose, Bld: 149 mg/dL — ABNORMAL HIGH (ref 70–99)
Potassium: 3.6 mmol/L (ref 3.5–5.1)
Sodium: 138 mmol/L (ref 135–145)

## 2023-10-16 SURGERY — CYSTOSCOPY, WITH STENT INSERTION
Anesthesia: General | Site: Ureter | Laterality: Right

## 2023-10-16 MED ORDER — SODIUM CHLORIDE 0.9 % IR SOLN
Status: DC | PRN
  Administered 2023-10-16: 3000 mL

## 2023-10-16 MED ORDER — PROPOFOL 500 MG/50ML IV EMUL
INTRAVENOUS | Status: DC | PRN
  Administered 2023-10-16: 100 ug/kg/min via INTRAVENOUS

## 2023-10-16 MED ORDER — SODIUM CHLORIDE 0.9 % IV SOLN: INTRAVENOUS | Status: AC

## 2023-10-16 MED ORDER — LIDOCAINE HCL (PF) 2 % IJ SOLN
INTRAMUSCULAR | Status: DC | PRN
  Administered 2023-10-16: 50 mg

## 2023-10-16 MED ORDER — MIDAZOLAM HCL 2 MG/2ML IJ SOLN
INTRAMUSCULAR | Status: AC
  Filled 2023-10-16: qty 2

## 2023-10-16 MED ORDER — IOHEXOL 180 MG/ML  SOLN
INTRAMUSCULAR | Status: DC | PRN
  Administered 2023-10-16: 10 mL

## 2023-10-16 MED ORDER — FENTANYL CITRATE (PF) 100 MCG/2ML IJ SOLN
INTRAMUSCULAR | Status: AC
  Filled 2023-10-16: qty 2

## 2023-10-16 MED ORDER — SODIUM CHLORIDE 0.9 % IR SOLN: Status: DC | PRN

## 2023-10-16 MED ORDER — PROPOFOL 10 MG/ML IV BOLUS
INTRAVENOUS | Status: DC | PRN
Start: 2023-10-16 — End: 2023-10-16
  Administered 2023-10-16: 20 mg via INTRAVENOUS
  Administered 2023-10-16 (×2): 40 mg via INTRAVENOUS

## 2023-10-16 MED ORDER — CHLORHEXIDINE GLUCONATE CLOTH 2 % EX PADS
6.0000 | MEDICATED_PAD | Freq: Every day | CUTANEOUS | Status: DC
  Administered 2023-10-17: 6 via TOPICAL

## 2023-10-16 MED ORDER — DEXMEDETOMIDINE HCL IN NACL 80 MCG/20ML IV SOLN
INTRAVENOUS | Status: DC | PRN
  Administered 2023-10-16: 12 ug via INTRAVENOUS

## 2023-10-16 MED ORDER — PROPOFOL 10 MG/ML IV BOLUS
INTRAVENOUS | Status: AC
  Filled 2023-10-16: qty 20

## 2023-10-16 MED ORDER — MIDAZOLAM HCL 5 MG/5ML IJ SOLN
INTRAMUSCULAR | Status: DC | PRN
  Administered 2023-10-16 (×2): 1 mg via INTRAVENOUS

## 2023-10-16 MED ORDER — FENTANYL CITRATE (PF) 100 MCG/2ML IJ SOLN
INTRAMUSCULAR | Status: DC | PRN
  Administered 2023-10-16 (×2): 50 ug via INTRAVENOUS

## 2023-10-16 SURGICAL SUPPLY — 20 items
BAG DRAIN SIEMENS DORNER NS (MISCELLANEOUS) ×1 IMPLANT
CATH URETL OPEN 5X70 (CATHETERS) ×1 IMPLANT
GAUZE 4X4 16PLY ~~LOC~~+RFID DBL (SPONGE) ×2 IMPLANT
GLOVE BIO SURGEON STRL SZ7 (GLOVE) ×1 IMPLANT
GLOVE BIO SURGEON STRL SZ8 (GLOVE) ×1 IMPLANT
GOWN STRL REUS W/ TWL LRG LVL3 (GOWN DISPOSABLE) ×1 IMPLANT
GUIDEWIRE STR ZIPWIRE 035X150 (MISCELLANEOUS) ×1 IMPLANT
KIT TURNOVER CYSTO (KITS) ×1 IMPLANT
MANIFOLD NEPTUNE II (INSTRUMENTS) ×1 IMPLANT
PACK CYSTO AR (MISCELLANEOUS) ×1 IMPLANT
SET CYSTO W/LG BORE CLAMP LF (SET/KITS/TRAYS/PACK) ×1 IMPLANT
SOL .9 NS 3000ML IRR UROMATIC (IV SOLUTION) ×1 IMPLANT
STENT URET 6FRX24 CONTOUR (STENTS) ×1 IMPLANT
STENT URET 6FRX26 CONTOUR (STENTS) ×1 IMPLANT
SURGILUBE 2OZ TUBE FLIPTOP (MISCELLANEOUS) ×1 IMPLANT
SYRINGE TOOMEY IRRIG 70ML (MISCELLANEOUS) ×1 IMPLANT
TRAP FLUID SMOKE EVACUATOR (MISCELLANEOUS) ×1 IMPLANT
TRAY FOLEY SLVR 16FR LF STAT (SET/KITS/TRAYS/PACK) IMPLANT
WATER STERILE IRR 1000ML POUR (IV SOLUTION) ×1 IMPLANT
WATER STERILE IRR 500ML POUR (IV SOLUTION) ×1 IMPLANT

## 2023-10-16 NOTE — Progress Notes (Signed)
 SUBJECTIVE: Patient is much more alert and denies any chest pain or palpitation or dizziness.   Vitals:   10/16/23 0034 10/16/23 0338 10/16/23 0456 10/16/23 0751  BP: (!) 150/74 (!) 150/74 (!) 157/71 (!) 153/72  Pulse: 67 67 67 65  Resp: 20 20 (!) 24 18  Temp: 97.8 F (36.6 C) 97.8 F (36.6 C) 98.1 F (36.7 C) 98 F (36.7 C)  TempSrc:  Oral    SpO2: 97%  94% 94%  Weight:  116.6 kg    Height:  5' 11 (1.803 m)      Intake/Output Summary (Last 24 hours) at 10/16/2023 1033 Last data filed at 10/15/2023 2359 Gross per 24 hour  Intake 1100 ml  Output --  Net 1100 ml    LABS: Basic Metabolic Panel: Recent Labs    10/15/23 0627 10/16/23 0538  NA 136 138  K 3.6 3.6  CL 101 104  CO2 25 27  GLUCOSE 171* 149*  BUN 16 19  CREATININE 0.80 0.72  CALCIUM  9.0 9.0  MG  --  1.9   Liver Function Tests: Recent Labs    10/14/23 2012  AST 26  ALT 29  ALKPHOS 55  BILITOT 1.0  PROT 8.2*  ALBUMIN 4.2   No results for input(s): LIPASE, AMYLASE in the last 72 hours. CBC: Recent Labs    10/14/23 2012 10/15/23 0627 10/16/23 0538  WBC 15.2* 15.3* 12.2*  NEUTROABS 12.0*  --  9.8*  HGB 14.8 13.2 12.5*  HCT 44.1 38.3* 36.6*  MCV 98.9 97.2 98.7  PLT 276 217 209   Cardiac Enzymes: No results for input(s): CKTOTAL, CKMB, CKMBINDEX, TROPONINI in the last 72 hours. BNP: Invalid input(s): POCBNP D-Dimer: No results for input(s): DDIMER in the last 72 hours. Hemoglobin A1C: No results for input(s): HGBA1C in the last 72 hours. Fasting Lipid Panel: No results for input(s): CHOL, HDL, LDLCALC, TRIG, CHOLHDL, LDLDIRECT in the last 72 hours. Thyroid  Function Tests: No results for input(s): TSH, T4TOTAL, T3FREE, THYROIDAB in the last 72 hours.  Invalid input(s): FREET3 Anemia Panel: No results for input(s): VITAMINB12, FOLATE, FERRITIN, TIBC, IRON, RETICCTPCT in the last 72 hours.   PHYSICAL EXAM General: Well developed, well  nourished, in no acute distress HEENT:  Normocephalic and atramatic Neck:  No JVD.  Lungs: Clear bilaterally to auscultation and percussion. Heart: HRRR . Normal S1 and S2 without gallops or murmurs.  Abdomen: Bowel sounds are positive, abdomen soft and non-tender  Msk:  Back normal, normal gait. Normal strength and tone for age. Extremities: No clubbing, cyanosis or edema.   Neuro: Alert and oriented X 3. Psych:  Good affect, responds appropriately  TELEMETRY: Sinus rhythm 67 bpm  ASSESSMENT AND PLAN: #1 UTI/sepsis associated with altered mental status.  Patient is much more alert and appears that treatment with antibiotic is helping and symptoms of confusion may have been related to infection. #2 history of type II second-degree AV block with intermittent 1.5-second pauses.  His heart rate today is much better and on monitor is sinus rhythm 66 bpm.  Will watch the patient and if he has more symptoms of presyncope may consider pacemaker implantation.  Right now appears to be stable. #3 kidney stones needing clearance for ureters stenting.  Patient appears to be much more alert and essentially asymptomatic with heart rate significantly improved.  Advise proceeding with urological procedure such as stenting. Will closely follow the patient.   ICD-10-CM   1. Urinary tract infection without hematuria, site unspecified  N39.0  2. Altered mental status, unspecified altered mental status type  R41.82     3. Sepsis, due to unspecified organism, unspecified whether acute organ dysfunction present (HCC)  A41.9       Principal Problem:   Sepsis due to urinary tract infection (HCC) Active Problems:   Hypertension   Mild cognitive impairment with memory loss   Sleep apnea   Acute metabolic encephalopathy   HLD (hyperlipidemia)   Diabetes mellitus without complication (HCC)   Chronic diastolic CHF (congestive heart failure) (HCC)   Myocardial injury   Obesity (BMI 30-39.9)   Meningioma  [facility])   Smoker    Denyse Bathe, MD, [facility] 10/16/2023 10:33 AM

## 2023-10-16 NOTE — Transfer of Care (Signed)
 Immediate Anesthesia Transfer of Care Note  Patient: Jason Washington  Procedure(s) Performed: CYSTOSCOPY, WITH STENT INSERTION (Right: Ureter)  Patient Location: PACU  Anesthesia Type:General  Level of Consciousness: sedated  Airway & Oxygen  Therapy: Patient Spontanous Breathing and Patient connected to face mask oxygen   Post-op Assessment: Report given to RN and Post -op Vital signs reviewed and stable  Post vital signs: Reviewed  Last Vitals:  Vitals Value Taken Time  BP 115/70   Temp    Pulse 66   Resp 16   SpO2 95     Last Pain:  Vitals:   10/16/23 0800  TempSrc:   PainSc: 0-No pain      Patients Stated Pain Goal: 0 (10/15/23 2033)  Complications: No notable events documented.

## 2023-10-16 NOTE — Anesthesia Preprocedure Evaluation (Signed)
 Anesthesia Evaluation  Patient identified by MRN, date of birth, ID band Patient awake    Reviewed: Allergy & Precautions, NPO status , Patient's Chart, lab work & pertinent test results  History of Anesthesia Complications Negative for: history of anesthetic complications  Airway Mallampati: III  TM Distance: >3 FB Neck ROM: Full    Dental  (+) Teeth Intact, Caps, Dental Advidsory Given, Poor Dentition   Pulmonary neg sleep apnea, neg COPD, Current Smoker and Patient abstained from smoking.   Pulmonary exam normal breath sounds clear to auscultation       Cardiovascular Exercise Tolerance: Good METShypertension, Pt. on medications (-) angina +CHF (diastolic)  (-) CAD, (-) Past MI and (-) Cardiac Stents + dysrhythmias (second degree AV block)  Rhythm:Regular Rate:Normal - Systolic murmurs    Neuro/Psych negative neurological ROS  negative psych ROS   GI/Hepatic ,GERD  ,,(+)     (-) substance abuse    Endo/Other  diabetes    Renal/GU negative Renal ROS     Musculoskeletal   Abdominal   Peds  Hematology   Anesthesia Other Findings Past Medical History: 11/21/2019: Basal cell carcinoma     Comment:  right superior lat pectoral/ EDC No date: Chronic GERD No date: Genital herpes No date: Hypertension No date: Melanoma (HCC)     Comment:  R knee several years ago  No date: Prostate CA (HCC)  Reproductive/Obstetrics                              Anesthesia Physical Anesthesia Plan  ASA: 3  Anesthesia Plan: General   Post-op Pain Management: Minimal or no pain anticipated   Induction: Intravenous  PONV Risk Score and Plan: 1 and Propofol  infusion and TIVA  Airway Management Planned: Nasal Cannula and Natural Airway  Additional Equipment: None  Intra-op Plan:   Post-operative Plan:   Informed Consent: I have reviewed the patients History and Physical, chart, labs and  discussed the procedure including the risks, benefits and alternatives for the proposed anesthesia with the patient or authorized representative who has indicated his/her understanding and acceptance.     Dental advisory given  Plan Discussed with: CRNA and Surgeon  Anesthesia Plan Comments: (Discussed risks of anesthesia with patient, including possibility of difficulty with spontaneous ventilation under anesthesia necessitating airway intervention, PONV, and rare risks such as cardiac or respiratory or neurological events, and allergic reactions. Discussed the role of CRNA in patient's perioperative care. Patient understands. Patient counseled on benefits of smoking cessation, and increased perioperative risks associated with continued smoking. )         Anesthesia Quick Evaluation

## 2023-10-16 NOTE — Plan of Care (Signed)

## 2023-10-16 NOTE — Progress Notes (Signed)
  Echocardiogram 2D Echocardiogram has been performed.  Thedora GORMAN Louder 10/16/2023, 9:00 AM

## 2023-10-16 NOTE — Anesthesia Postprocedure Evaluation (Signed)
 Anesthesia Post Note  Patient: Jason Washington  Procedure(s) Performed: CYSTOSCOPY, WITH STENT INSERTION (Right: Ureter)  Patient location during evaluation: PACU Anesthesia Type: General Level of consciousness: awake and alert Pain management: pain level controlled Vital Signs Assessment: post-procedure vital signs reviewed and stable Respiratory status: spontaneous breathing, nonlabored ventilation, respiratory function stable and patient connected to nasal cannula oxygen  Cardiovascular status: blood pressure returned to baseline and stable Postop Assessment: no apparent nausea or vomiting Anesthetic complications: no   No notable events documented.   Last Vitals:  Vitals:   10/16/23 1215 10/16/23 1317  BP: (!) 143/68 (!) 152/66  Pulse: 62 (!) 51  Resp: 16   Temp: (!) 36.3 C (!) 36.4 C  SpO2: 95% 93%    Last Pain:  Vitals:   10/16/23 1215  TempSrc:   PainSc: 0-No pain                 Prentice Murphy

## 2023-10-16 NOTE — Op Note (Signed)
.  Preoperative diagnosis: right proximal ureteral stone, sepsis  Postoperative diagnosis: Same  Procedure: 1 cystoscopy 2. right retrograde pyelography 3.  Intraoperative fluoroscopy, under one hour, with interpretation 4. right 6 x 26 JJ stent placement  Attending: [name]  Anesthesia: General  Estimated blood loss: None  Drains: Right 6 x 26 JJ ureteral stent without tether, 16 French foley catheter  Specimens: none  Antibiotics: rocephin   Findings:right  proximal ureteral stone. Moderate hydronephrosis. No masses/lesions in the bladder. Ureteral orifices in normal anatomic location.  Indications: Patient is a 78 year old male with a history of a right ureteral stone and concern for sepsis.  After discussing treatment options, they decided proceed with left stent placement.  Procedure in detail: The patient was brought to the operating room and a brief timeout was done to ensure correct patient, correct procedure, correct site.  General anesthesia was administered patient was placed in dorsal lithotomy position.  Their genitalia was then prepped and draped in usual sterile fashion.  A rigid 22 French cystoscope was passed in the urethra and the bladder.  Bladder was inspected free masses or lesions.  the ureteral orifices were in the normal orthotopic locations.  a 6 french ureteral catheter was then instilled into the right ureteral orifice.  a gentle retrograde was obtained and findings noted above.  we then placed a zip wire through the ureteral catheter and advanced up to the renal pelvis.    We then placed a 6 x 26 double-j ureteral stent over the original zip wire.  We then removed the wire and good coil was noted in the the renal pelvis under fluoroscopy and the bladder under direct vision.  A foley catheter was then placed. the bladder was then drained and this concluded the procedure which was well tolerated by patient.  Complications: None  Condition: Stable,  extubated, transferred to PACU  Plan: Patient is to be admitted for IV antibiotics. He will have his stone extraction in 2 weeks.

## 2023-10-16 NOTE — Progress Notes (Signed)
 PHARMACY - PHYSICIAN COMMUNICATION CRITICAL VALUE ALERT - BLOOD CULTURE IDENTIFICATION (BCID)  Jason Washington Nomar Broad is an 78 y.o. male who presented to [facility] on 10/14/2023 with a chief complaint of confusion and UTI symptoms  Assessment:  1/4(aerobic) enterobacterales and proteus, no resistance detected (likely urinary source)  Name of physician (or Provider) Contacted: Erminio Cone  Current antibiotics: Ceftriaxone   Changes to prescribed antibiotics recommended:  Patient is on recommended antibiotics - No changes needed Will await susceptibilities and de-escalate as able  Results for orders placed or performed during the hospital encounter of 10/14/23  Blood Culture ID Panel (Reflexed) (Collected: 10/14/2023  8:55 PM)  Result Value Ref Range   Enterococcus faecalis NOT DETECTED NOT DETECTED   Enterococcus Faecium NOT DETECTED NOT DETECTED   Listeria monocytogenes NOT DETECTED NOT DETECTED   Staphylococcus species NOT DETECTED NOT DETECTED   Staphylococcus aureus (BCID) NOT DETECTED NOT DETECTED   Staphylococcus epidermidis NOT DETECTED NOT DETECTED   Staphylococcus lugdunensis NOT DETECTED NOT DETECTED   Streptococcus species NOT DETECTED NOT DETECTED   Streptococcus agalactiae NOT DETECTED NOT DETECTED   Streptococcus pneumoniae NOT DETECTED NOT DETECTED   Streptococcus pyogenes NOT DETECTED NOT DETECTED   A.calcoaceticus-baumannii NOT DETECTED NOT DETECTED   Bacteroides fragilis NOT DETECTED NOT DETECTED   Enterobacterales DETECTED (A) NOT DETECTED   Enterobacter cloacae complex NOT DETECTED NOT DETECTED   Escherichia coli NOT DETECTED NOT DETECTED   Klebsiella aerogenes NOT DETECTED NOT DETECTED   Klebsiella oxytoca NOT DETECTED NOT DETECTED   Klebsiella pneumoniae NOT DETECTED NOT DETECTED   Proteus species DETECTED (A) NOT DETECTED   Salmonella species NOT DETECTED NOT DETECTED   Serratia marcescens NOT DETECTED NOT DETECTED   Haemophilus influenzae NOT DETECTED  NOT DETECTED   Neisseria meningitidis NOT DETECTED NOT DETECTED   Pseudomonas aeruginosa NOT DETECTED NOT DETECTED   Stenotrophomonas maltophilia NOT DETECTED NOT DETECTED   Candida albicans NOT DETECTED NOT DETECTED   Candida auris NOT DETECTED NOT DETECTED   Candida glabrata NOT DETECTED NOT DETECTED   Candida krusei NOT DETECTED NOT DETECTED   Candida parapsilosis NOT DETECTED NOT DETECTED   Candida tropicalis NOT DETECTED NOT DETECTED   Cryptococcus neoformans/gattii NOT DETECTED NOT DETECTED   CTX-M ESBL NOT DETECTED NOT DETECTED   Carbapenem resistance IMP NOT DETECTED NOT DETECTED   Carbapenem resistance KPC NOT DETECTED NOT DETECTED   Carbapenem resistance NDM NOT DETECTED NOT DETECTED   Carbapenem resist OXA 48 LIKE NOT DETECTED NOT DETECTED   Carbapenem resistance VIM NOT DETECTED NOT DETECTED    Florella Mcneese A Shaquna Geigle 10/16/2023  2:34 AM

## 2023-10-16 NOTE — Progress Notes (Signed)
  Echocardiogram 2D Echocardiogram has been performed.  Jason Washington Louder 10/16/2023, 12:39 PM

## 2023-10-16 NOTE — Interval H&P Note (Signed)
 History and Physical Interval Note:  10/16/2023 11:00 AM  Jason Washington  has presented today for surgery, with the diagnosis of ureteral calculi.  The various methods of treatment have been discussed with the patient and family. After consideration of risks, benefits and other options for treatment, the patient has consented to  Procedure(s): CYSTOSCOPY, WITH STENT INSERTION (Right) as a surgical intervention.  The patient's history has been reviewed, patient examined, no change in status, stable for surgery.  I have reviewed the patient's chart and labs.  Questions were answered to the patient's satisfaction.     [name]

## 2023-10-16 NOTE — Progress Notes (Signed)
 Progress Note   Patient: Jason Washington FMW:[id number] DOB: [birth year] DOA: 10/14/2023     2 DOS: the patient was seen and examined on 10/16/2023   Brief hospital course: Jason Washington is a 79 y.o. male with medical history significant of 3 mm right ureteral stone with hydronephrosis (possibly dislodged into bladder 07/24/23 ), HTN, HLD, DM, dCHF, dementia, prostate cancer, obesity, smoker, meningioma, who presents with confusion.   Patient has AMS, and is unable to provide accurate medical history, therefore, most of the history is obtained by discussing the case with ED physician, per EMS report, and with the nursing staff.   Per EDP, his wife reported that pt did not eat breakfast, and  slept most of the day which is unusual for him.  Subsequently he left the house to go to [facility] but could not figure out how to put his truck in reverse.  The wife became worried about him and tried to call him, but he was not picking up.  wife went to find him, and found him in his truck in the parking lot unable to get out of it and is confused. He was unable to walk on his own at that time.    When I saw pt on the floor, he is confused, and knows his own name, not oriented to place and time.  Patient moves all extremities normally.  No facial droop or slurred speech.  No active respiratory distress, cough, SOB noted.  No active nausea, vomiting, diarrhea noted.  Patient does not seem to have abdominal pain or chest pain.  He denies flank pain.  He also denies symptoms of UTI which is are slightly not reliable due to confusion.  Patient has fever with temperature 103 in ED.   08/30 : Noted to have second-degree AV block on cardiac monitor but asymptomatic.  Cardiology consulted.  Neurology consult placed for right-sided mild hydronephrosis   08/31: Scheduled for ureteral stent placement today.    Assessment and Plan:  Sepsis due to urinary tract infection [facility]):  Proteus bacteremia Patient  met criteria for severe sepsis with WBC 15.2, temperature 103, heart rate up to 114, RR 22.  Lactic acid 3.1 --> 2.5.  Pt has history of 3mm right ureteral stone.  Renal stone CT showed 4 mm obstructive stone within the mid right ureter with secondary mild right hydroureteronephrosis. Few additional nonobstructive right renal calculi measuring up to 7 mm Continue empiric antibiotic therapy with IV Rocephin  while awaiting sensitivity Appreciate urology input, plan is for stent placement 08/31 Follow-up results of urine culture   Type II second-degree AV block with intermittent pauses Appreciate cardiology input Recommends continued monitoring and if sinus pauses are greater than 3 seconds or if patient becomes symptomatic he may require pacemaker implantation Avoid AV nodal blocking agents      Meningioma:  Patient has history of known meningioma and is scheduled for surgical excision on 10/19/2023.   CT scan showed stable meningioma, with stable 5 mm rightward midline shift of the lateral ventricles. Still has some mass effect and vasogenic edema. Continue Decadron  4 mg twice daily     Acute metabolic encephalopathy:   Likely due to sepsis from UTI.  Improved He has history of meningioma, which is stable. Fall precautions Frequent neurocheck     Hypertension Continue amlodipine  5 mg daily     Chronic diastolic CHF (congestive heart failure) (HCC):  2D echo on 07/13/2023 showed EF> 55% with grade 1 diastolic dysfunction.  CHF seem to be compensated. Optimize blood pressure control     HLD (hyperlipidemia) Continue Lipitor     Diabetes mellitus without complication [facility]):  Recent A1c 6.2, well-controlled. SSI Maintain consistent carbohydrate diet   Myocardial injury: Trop is minimally elevated 18 --> 18.   Likely demand ischemia. Continue aspirin  and  Lipitor     Mild cognitive impairment Donepezil  is on hold due to second-degree AV block      Obesity (BMI  30-39.9):  Sleep apnea Patient has Obesity Class II, with body weight 116.6  Kg and BMI 35.80 kg/m2.  Encourage losing weight Exercise and healthy diet Continue CPAP at bedtime             Subjective: More awake and alert.  No events overnight.  Physical Exam: Vitals:   10/16/23 0456 10/16/23 0751 10/16/23 1156 10/16/23 1200  BP: (!) 157/71 (!) 153/72 115/70 116/65  Pulse: 67 65 66 61  Resp: (!) 24 18 10 10   Temp: 98.1 F (36.7 C) 98 F (36.7 C) 98.1 F (36.7 C)   TempSrc:      SpO2: 94% 94% 95% 95%  Weight:      Height:       General: Not in acute distress, obese HEENT: Eyes: PERRL, EOMI, no jaundice ENT: No discharge from the ears and nose. Neck: No JVD, no bruit, no mass felt. Heme: No neck lymph node enlargement. Cardiac: S1/S2, RRR, No murmurs, No gallops or rubs. Respiratory: No rales, wheezing, rhonchi or rubs. GI: Soft, nondistended, nontender, no organomegaly, BS present. GU: No hematuria Ext: No pitting leg edema bilaterally. 1+DP/PT pulse bilaterally. Musculoskeletal: No joint deformities, No joint redness or warmth, no limitation of ROM in spin. Skin: No rashes.  Neuro: Awake, alert and oriented x 3.  Cranial nerves II-XII grossly intact, moves all extremities normally. Psych: Patient is not psychotic, no suicidal or hemocidal ideation.      Data Reviewed: White count 12.2 down from 15 with a left shift Labs reviewed  Family Communication: Plan of care discussed with patient at the bedside.  He verbalizes understanding and agrees with the plan.  Disposition: Status is: Inpatient Remains inpatient appropriate because: On IV antibiotics  Planned Discharge Destination: TBD    Time spent: 55 minutes  Author: Aimee Somerset, MD 10/16/2023 12:36 PM  For on call review [email or web address].

## 2023-10-17 ENCOUNTER — Encounter: Payer: Self-pay | Admitting: Urology

## 2023-10-17 ENCOUNTER — Inpatient Hospital Stay

## 2023-10-17 DIAGNOSIS — I7121 Aneurysm of the ascending aorta, without rupture: Secondary | ICD-10-CM | POA: Diagnosis not present

## 2023-10-17 DIAGNOSIS — I4891 Unspecified atrial fibrillation: Secondary | ICD-10-CM | POA: Diagnosis not present

## 2023-10-17 DIAGNOSIS — A419 Sepsis, unspecified organism: Secondary | ICD-10-CM | POA: Diagnosis not present

## 2023-10-17 DIAGNOSIS — R079 Chest pain, unspecified: Secondary | ICD-10-CM | POA: Diagnosis not present

## 2023-10-17 DIAGNOSIS — N390 Urinary tract infection, site not specified: Secondary | ICD-10-CM | POA: Diagnosis not present

## 2023-10-17 DIAGNOSIS — I471 Supraventricular tachycardia, unspecified: Secondary | ICD-10-CM | POA: Diagnosis not present

## 2023-10-17 LAB — CBC WITH DIFFERENTIAL/PLATELET
Abs Immature Granulocytes: 0.12 K/uL — ABNORMAL HIGH (ref 0.00–0.07)
Basophils Absolute: 0 K/uL (ref 0.0–0.1)
Basophils Relative: 0 %
Eosinophils Absolute: 0 K/uL (ref 0.0–0.5)
Eosinophils Relative: 0 %
HCT: 36.9 % — ABNORMAL LOW (ref 39.0–52.0)
Hemoglobin: 12.3 g/dL — ABNORMAL LOW (ref 13.0–17.0)
Immature Granulocytes: 1 %
Lymphocytes Relative: 11 %
Lymphs Abs: 1.3 K/uL (ref 0.7–4.0)
MCH: 32.9 pg (ref 26.0–34.0)
MCHC: 33.3 g/dL (ref 30.0–36.0)
MCV: 98.7 fL (ref 80.0–100.0)
Monocytes Absolute: 0.7 K/uL (ref 0.1–1.0)
Monocytes Relative: 6 %
Neutro Abs: 9.5 K/uL — ABNORMAL HIGH (ref 1.7–7.7)
Neutrophils Relative %: 82 %
Platelets: 239 K/uL (ref 150–400)
RBC: 3.74 MIL/uL — ABNORMAL LOW (ref 4.22–5.81)
RDW: 12.7 % (ref 11.5–15.5)
WBC: 11.6 K/uL — ABNORMAL HIGH (ref 4.0–10.5)
nRBC: 0 % (ref 0.0–0.2)

## 2023-10-17 LAB — BASIC METABOLIC PANEL WITH GFR
Anion gap: 12 (ref 5–15)
BUN: 20 mg/dL (ref 8–23)
CO2: 22 mmol/L (ref 22–32)
Calcium: 8.5 mg/dL — ABNORMAL LOW (ref 8.9–10.3)
Chloride: 103 mmol/L (ref 98–111)
Creatinine, Ser: 0.6 mg/dL — ABNORMAL LOW (ref 0.61–1.24)
GFR, Estimated: >60 mL/min (ref >60)
Glucose, Bld: 170 mg/dL — ABNORMAL HIGH (ref 70–99)
Potassium: 3.7 mmol/L (ref 3.5–5.1)
Sodium: 137 mmol/L (ref 135–145)

## 2023-10-17 LAB — GLUCOSE, CAPILLARY
Glucose-Capillary: 126 mg/dL — ABNORMAL HIGH (ref 70–99)
Glucose-Capillary: 126 mg/dL — ABNORMAL HIGH (ref 70–99)
Glucose-Capillary: 112 mg/dL — ABNORMAL HIGH (ref 70–99)
Glucose-Capillary: 137 mg/dL — ABNORMAL HIGH (ref 70–99)

## 2023-10-17 NOTE — Progress Notes (Signed)
 Physical Therapy Treatment Patient Details Name: Jason Washington MRN: [id number] DOB: [birth year] Today's Date: 10/17/2023   History of Present Illness Pt is a 78 y/o M admitted on 10/14/23 after presenting with AMS. Pt is being treated for sepsis 2/2 UTI, acute metabolic encephalopathy. PMH: 3mm R ureteral stone with hydronephrosis, HTN, HLD, DM, dCHF, dementia, prostate CA, obesity, smoker, meningioma    PT Comments  Pt alert, oriented to person and time, agreeable to participate in PT treatment. Cueing required to orient pt to place and situation. Pt denied pain throughout session. Pt was met sitting in recliner, able to complete 2 STS transfers with supervision, BUE support on armrests. Pt amb ~13ft with no AD and CGA. Brief standing rest break provided to assess pt's HR- 72bpm after amb ~58ft. No LOB noted with ambulation, however pt displayed mild postural sway and amb with narrowed BOS and minimal dorsiflexion BLE. Pt was left seated on toilet with pt's wife stating she will help him get back to chair- RN made aware and okay with pt positioning. HR 70bpm at end of session. The patient would benefit from further skilled PT intervention to continue to progress towards goals.     If plan is discharge home, recommend the following: A little help with walking and/or transfers;A little help with bathing/dressing/bathroom;Assistance with cooking/housework;Direct supervision/assist for medications management;Direct supervision/assist for financial management;Assist for transportation;Help with stairs or ramp for entrance;Supervision due to cognitive status   Can travel by private vehicle        Equipment Recommendations  None recommended by PT    Recommendations for Other Services       Precautions / Restrictions Precautions Precautions: Fall Restrictions Weight Bearing Restrictions Per Provider Order: No     Mobility  Bed Mobility               General bed mobility comments:  NT this session, pt seated in recliner at start/end of session.    Transfers Overall transfer level: Needs assistance Equipment used: None Transfers: Sit to/from Stand Sit to Stand: Supervision           General transfer comment: 2 STS from recliner with supervision, heavy UE use on armrests to assist with standing.    Ambulation/Gait Ambulation/Gait assistance: Contact guard assist Gait Distance (Feet): 160 Feet Assistive device: None Gait Pattern/deviations: Decreased step length - right, Decreased step length - left, Decreased dorsiflexion - right, Decreased dorsiflexion - left, Decreased stride length, Shuffle, Narrow base of support       General Gait Details: no LOB or excessive path variance, decreased dorsiflexion noted bilaterally   Stairs             Wheelchair Mobility     Tilt Bed    Modified Rankin (Stroke Patients Only)       Balance Overall balance assessment: Needs assistance Sitting-balance support: Feet supported Sitting balance-Leahy Scale: Good     Standing balance support: No upper extremity supported Standing balance-Leahy Scale: Good Standing balance comment: no LOB, mild postural sway with dynamic standing                            Communication Communication Communication: No apparent difficulties  Cognition Arousal: Alert Behavior During Therapy: WFL for tasks assessed/performed   PT - Cognitive impairments: Orientation, Memory, Problem solving, Safety/Judgement   Orientation impairments: Situation, Place  PT - Cognition Comments: Pt disoriented to situation and place, required cues and extra time to answer orientation questions Following commands: Intact      Cueing Cueing Techniques: Verbal cues, Tactile cues  Exercises Other Exercises Other Exercises: HR remained between 70-75bpm throughout session    General Comments        Pertinent Vitals/Pain Pain Assessment Pain  Assessment: No/denies pain    Home Living                          Prior Function            PT Goals (current goals can now be found in the care plan section) Progress towards PT goals: Progressing toward goals    Frequency    Min 2X/week      PT Plan      Co-evaluation              AM-PAC PT 6 Clicks Mobility   Outcome Measure  Help needed turning from your back to your side while in a flat bed without using bedrails?: None Help needed moving from lying on your back to sitting on the side of a flat bed without using bedrails?: A Little Help needed moving to and from a bed to a chair (including a wheelchair)?: None Help needed standing up from a chair using your arms (e.g., wheelchair or bedside chair)?: None Help needed to walk in hospital room?: A Little Help needed climbing 3-5 steps with a railing? : A Little 6 Click Score: 21    End of Session Equipment Utilized During Treatment: Gait belt Activity Tolerance: Patient tolerated treatment well Patient left: Other (comment);with family/visitor present (pt was seated left on toilet- RN aware) Nurse Communication: Mobility status;Other (comment) (pt left on toilet, wife to assist back to chair) PT Visit Diagnosis: Unsteadiness on feet (R26.81);Muscle weakness (generalized) (M62.81);Other abnormalities of gait and mobility (R26.89)     Time: 1439-1450 PT Time Calculation (min) (ACUTE ONLY): 11 min  Charges:    $Therapeutic Activity: 8-22 mins PT General Charges $$ ACUTE PT VISIT: 1 Visit                     Tavonna Worthington, SPT

## 2023-10-17 NOTE — Progress Notes (Signed)
 SUBJECTIVE: Patient is feeling well denies any chest pain or shortness of breath   Vitals:   10/16/23 2041 10/17/23 0443 10/17/23 0451 10/17/23 0752  BP: (!) 156/73  (!) 162/77 (!) 174/75  Pulse: 62  61 (!) 56  Resp: 17  17 18   Temp: 98.3 F (36.8 C)  98.2 F (36.8 C) 98.4 F (36.9 C)  TempSrc: Oral  Oral   SpO2: 94%  94% 95%  Weight:  119.3 kg    Height:        Intake/Output Summary (Last 24 hours) at 10/17/2023 1151 Last data filed at 10/17/2023 0753 Gross per 24 hour  Intake 1726.57 ml  Output 2150 ml  Net -423.43 ml    LABS: Basic Metabolic Panel: Recent Labs    10/16/23 0538 10/17/23 1024  NA 138 137  K 3.6 3.7  CL 104 103  CO2 27 22  GLUCOSE 149* 170*  BUN 19 20  CREATININE 0.72 0.60*  CALCIUM  9.0 8.5*  MG 1.9  --    Liver Function Tests: Recent Labs    10/14/23 2012  AST 26  ALT 29  ALKPHOS 55  BILITOT 1.0  PROT 8.2*  ALBUMIN 4.2   No results for input(s): LIPASE, AMYLASE in the last 72 hours. CBC: Recent Labs    10/16/23 0538 10/17/23 1024  WBC 12.2* 11.6*  NEUTROABS 9.8* 9.5*  HGB 12.5* 12.3*  HCT 36.6* 36.9*  MCV 98.7 98.7  PLT 209 239   Cardiac Enzymes: No results for input(s): CKTOTAL, CKMB, CKMBINDEX, TROPONINI in the last 72 hours. BNP: Invalid input(s): POCBNP D-Dimer: No results for input(s): DDIMER in the last 72 hours. Hemoglobin A1C: No results for input(s): HGBA1C in the last 72 hours. Fasting Lipid Panel: No results for input(s): CHOL, HDL, LDLCALC, TRIG, CHOLHDL, LDLDIRECT in the last 72 hours. Thyroid  Function Tests: No results for input(s): TSH, T4TOTAL, T3FREE, THYROIDAB in the last 72 hours.  Invalid input(s): FREET3 Anemia Panel: No results for input(s): VITAMINB12, FOLATE, FERRITIN, TIBC, IRON, RETICCTPCT in the last 72 hours.   PHYSICAL EXAM General: Well developed, well nourished, in no acute distress HEENT:  Normocephalic and atramatic Neck:  No JVD.   Lungs: Clear bilaterally to auscultation and percussion. Heart: HRRR . Normal S1 and S2 without gallops or murmurs.  Abdomen: Bowel sounds are positive, abdomen soft and non-tender  Msk:  Back normal, normal gait. Normal strength and tone for age. Extremities: No clubbing, cyanosis or edema.   Neuro: Alert and oriented X 3. Psych:  Good affect, responds appropriately  TELEMETRY: Sinus rhythm 66 bpm  ASSESSMENT AND PLAN: #1 status post second-degree AV block with up to 1.5-second pauses on admission.  Patient had UTI and possible sepsis on presentation and was confused.  Since then his heart rate has significantly improved and is no longer having any significant pauses.  There is no indication for pacemaker. #2 altered mental status was probably related to infection. #3 status post stent placement in his ureter without any complications. #4 meningioma,  should be okay to proceed with surgery as an outpatient.   ICD-10-CM   1. Urinary tract infection without hematuria, site unspecified  N39.0     2. Altered mental status, unspecified altered mental status type  R41.82     3. Sepsis, due to unspecified organism, unspecified whether acute organ dysfunction present (HCC)  A41.9       Principal Problem:   Sepsis due to urinary tract infection (HCC) Active Problems:   Hypertension  Mild cognitive impairment with memory loss   Sleep apnea   Acute metabolic encephalopathy   HLD (hyperlipidemia)   Diabetes mellitus without complication (HCC)   Chronic diastolic CHF (congestive heart failure) (HCC)   Myocardial injury   Obesity (BMI 30-39.9)   Meningioma [facility])   Smoker    Denyse Bathe, MD, [facility] 10/17/2023 11:51 AM

## 2023-10-17 NOTE — Discharge Instructions (Signed)
 Be sure to follow-up with [facility] to plan stone/stent removal.    [facility] They will call you to set up when they will come to your home for assessment   [facility] [address], [place], [place] [address] Hours:  Open 24 hours Phone: [phone]

## 2023-10-17 NOTE — Progress Notes (Signed)
 ID Asked to see patient for proteus bacteremia Chart reviewed Rt ureteric stone with obstructive uropathy/pyelonephritis Underwent stent placement yesterday  Continue ceftriaxone  Full consult to follow tomorrow

## 2023-10-17 NOTE — Progress Notes (Signed)
 Progress Note   Patient: Jason Washington FMW:[id number] DOB: Nov 16, 1945 DOA: 10/14/2023     3 DOS: the patient was seen and examined on 10/17/2023   Brief hospital course:  Jason Washington is a 78 y.o. male with medical history significant of 3 mm right ureteral stone with hydronephrosis (possibly dislodged into bladder 07/24/23 ), HTN, HLD, DM, dCHF, dementia, prostate cancer, obesity, smoker, meningioma, who presents with confusion.   Patient has AMS, and is unable to provide accurate medical history, therefore, most of the history is obtained by discussing the case with ED physician, per EMS report, and with the nursing staff.   Per EDP, his wife reported that pt did not eat breakfast, and  slept most of the day which is unusual for him.  Subsequently he left the house to go to [facility] [place] but could not figure out how to put his truck in reverse.  The wife became worried about him and tried to call him, but he was not picking up.  wife went to find him, and found him in his truck in the parking lot unable to get out of it and is confused. He was unable to walk on his own at that time.    When I saw pt on the floor, he is confused, and knows his own name, not oriented to place and time.  Patient moves all extremities normally.  No facial droop or slurred speech.  No active respiratory distress, cough, SOB noted.  No active nausea, vomiting, diarrhea noted.  Patient does not seem to have abdominal pain or chest pain.  He denies flank pain.  He also denies symptoms of UTI which is are slightly not reliable due to confusion.  Patient has fever with temperature 103 in ED.     08/30 : Noted to have second-degree AV block on cardiac monitor but asymptomatic.  Cardiology consulted.  Neurology consult placed for right-sided mild hydronephrosis     08/31: Scheduled for ureteral stent placement today.  09/01: No complaints.  Voiding trial       Assessment and Plan:  Sepsis due to urinary tract  infection [facility]):  Proteus bacteremia Patient met criteria for severe sepsis with WBC 15.2, temperature 103, heart rate up to 114, RR 22.  Lactic acid 3.1 --> 2.5.   Renal stone CT showed 4 mm obstructive stone within the mid right ureter with secondary mild right hydroureteronephrosis. Few additional nonobstructive right renal calculi measuring up to 7 mm Appreciate urology input, patient is status post ureteral stent placement on 08/31 Blood cultures yields Proteus species.  Sensitivity pending Continue empiric antibiotic therapy with IV Rocephin  while awaiting sensitivity Urine culture is sterile ID consult for recommendations for duration of antibiotic therapy      Type II second-degree AV block with intermittent pauses No further episodes Appreciate cardiology input Recommends continued monitoring and if sinus pauses are greater than 3 seconds or if patient becomes symptomatic he may require pacemaker implantation Avoid AV nodal blocking agents       Meningioma:  Patient has history of known meningioma and is scheduled for surgical excision on 10/19/2023.   CT scan showed stable meningioma, with stable 5 mm rightward midline shift of the lateral ventricles. Still has some mass effect and vasogenic edema. Continue Decadron  4 mg twice daily Scheduled for resection as an outpatient      Acute metabolic encephalopathy:   Likely due to sepsis from UTI.  Improved He has history of meningioma, which  is stable. Fall precautions Frequent neurocheck     Hypertension Continue amlodipine  5 mg daily     Chronic diastolic CHF (congestive heart failure) (HCC):  2D echo on 07/13/2023 showed EF> 55% with grade 1 diastolic dysfunction.  CHF seem to be compensated. Optimize blood pressure control     HLD (hyperlipidemia) Continue Lipitor     Diabetes mellitus without complication [facility]):  Recent A1c 6.2, well-controlled. SSI Maintain consistent carbohydrate diet   Myocardial  injury: Trop is minimally elevated 18 --> 18.   Likely demand ischemia. Continue aspirin  and  Lipitor     Mild cognitive impairment Donepezil  is on hold due to second-degree AV block       Obesity (BMI 30-39.9):  Sleep apnea Patient has Obesity Class II, with body weight 116.6  Kg and BMI 35.80 kg/m2.  Encourage losing weight Exercise and healthy diet Continue CPAP at bedtime                Subjective: No new complaints.  Awake, alert and oriented x 3  Physical Exam: Vitals:   10/16/23 2041 10/17/23 0443 10/17/23 0451 10/17/23 0752  BP: (!) 156/73  (!) 162/77 (!) 174/75  Pulse: 62  61 (!) 56  Resp: 17  17 18   Temp: 98.3 F (36.8 C)  98.2 F (36.8 C) 98.4 F (36.9 C)  TempSrc: Oral  Oral   SpO2: 94%  94% 95%  Weight:  119.3 kg    Height:       General: Not in acute distress, obese HEENT: Eyes: PERRL, EOMI, no jaundice ENT: No discharge from the ears and nose. Neck: No JVD, no bruit, no mass felt. Heme: No neck lymph node enlargement. Cardiac: S1/S2, RRR, No murmurs, No gallops or rubs. Respiratory: No rales, wheezing, rhonchi or rubs. GI: Soft, nondistended, nontender, no organomegaly, BS present. GU: No hematuria Ext: No pitting leg edema bilaterally. 1+DP/PT pulse bilaterally. Musculoskeletal: No joint deformities, No joint redness or warmth, no limitation of ROM in spin. Skin: No rashes.  Neuro: Awake, alert and oriented x 3.  Cranial nerves II-XII grossly intact, moves all extremities normally. Psych: Patient is not psychotic, no suicidal or hemocidal ideation.     Data Reviewed: White count 11.6, hemoglobin 12.3 Labs reviewed  Family Communication: Plan of care discussed with patient and his wife at the bedside.  She verbalizes understanding and agrees with the plan  Disposition: Status is: Inpatient Remains inpatient appropriate because: Remains on IV antibiotics  Planned Discharge Destination: Home with Home Health    Time spent: 35  minutes  Author: Aimee Somerset, MD 10/17/2023 2:50 PM  For on call review [email or web address].

## 2023-10-17 NOTE — Progress Notes (Signed)
 Patient and wife are concerned about the cough and wheeze that the patient has at times that has been getting  worse over the last few weeks/months. MD Agbata informed and ordered a chest CT

## 2023-10-18 ENCOUNTER — Other Ambulatory Visit: Payer: Self-pay

## 2023-10-18 ENCOUNTER — Other Ambulatory Visit: Payer: Self-pay | Admitting: Urology

## 2023-10-18 ENCOUNTER — Other Ambulatory Visit: Payer: Self-pay | Admitting: Infectious Diseases

## 2023-10-18 DIAGNOSIS — N201 Calculus of ureter: Secondary | ICD-10-CM

## 2023-10-18 DIAGNOSIS — N139 Obstructive and reflux uropathy, unspecified: Secondary | ICD-10-CM

## 2023-10-18 DIAGNOSIS — B964 Proteus (mirabilis) (morganii) as the cause of diseases classified elsewhere: Secondary | ICD-10-CM

## 2023-10-18 DIAGNOSIS — I440 Atrioventricular block, first degree: Secondary | ICD-10-CM | POA: Diagnosis not present

## 2023-10-18 DIAGNOSIS — R001 Bradycardia, unspecified: Secondary | ICD-10-CM | POA: Diagnosis not present

## 2023-10-18 DIAGNOSIS — N390 Urinary tract infection, site not specified: Secondary | ICD-10-CM | POA: Diagnosis not present

## 2023-10-18 DIAGNOSIS — R7881 Bacteremia: Secondary | ICD-10-CM

## 2023-10-18 DIAGNOSIS — I7121 Aneurysm of the ascending aorta, without rupture: Secondary | ICD-10-CM | POA: Diagnosis present

## 2023-10-18 DIAGNOSIS — A419 Sepsis, unspecified organism: Secondary | ICD-10-CM | POA: Diagnosis not present

## 2023-10-18 DIAGNOSIS — N1330 Unspecified hydronephrosis: Secondary | ICD-10-CM

## 2023-10-18 LAB — CBC WITH DIFFERENTIAL/PLATELET
Abs Immature Granulocytes: 0.17 K/uL — ABNORMAL HIGH (ref 0.00–0.07)
Basophils Absolute: 0 K/uL (ref 0.0–0.1)
Basophils Relative: 0 %
Eosinophils Absolute: 0 K/uL (ref 0.0–0.5)
Eosinophils Relative: 0 %
HCT: 36.9 % — ABNORMAL LOW (ref 39.0–52.0)
Hemoglobin: 12.5 g/dL — ABNORMAL LOW (ref 13.0–17.0)
Immature Granulocytes: 2 %
Lymphocytes Relative: 19 %
Lymphs Abs: 1.9 K/uL (ref 0.7–4.0)
MCH: 33.2 pg (ref 26.0–34.0)
MCHC: 33.9 g/dL (ref 30.0–36.0)
MCV: 97.9 fL (ref 80.0–100.0)
Monocytes Absolute: 0.6 K/uL (ref 0.1–1.0)
Monocytes Relative: 6 %
Neutro Abs: 7.3 K/uL (ref 1.7–7.7)
Neutrophils Relative %: 73 %
Platelets: 258 K/uL (ref 150–400)
RBC: 3.77 MIL/uL — ABNORMAL LOW (ref 4.22–5.81)
RDW: 12.7 % (ref 11.5–15.5)
WBC: 10 K/uL (ref 4.0–10.5)
nRBC: 0 % (ref 0.0–0.2)

## 2023-10-18 LAB — GLUCOSE, CAPILLARY
Glucose-Capillary: 115 mg/dL — ABNORMAL HIGH (ref 70–99)
Glucose-Capillary: 109 mg/dL — ABNORMAL HIGH (ref 70–99)

## 2023-10-18 LAB — BASIC METABOLIC PANEL WITH GFR
Anion gap: 14 (ref 5–15)
BUN: 21 mg/dL (ref 8–23)
CO2: 24 mmol/L (ref 22–32)
Calcium: 8.9 mg/dL (ref 8.9–10.3)
Chloride: 102 mmol/L (ref 98–111)
Creatinine, Ser: 0.78 mg/dL (ref 0.61–1.24)
GFR, Estimated: >60 mL/min (ref >60)
Glucose, Bld: 148 mg/dL — ABNORMAL HIGH (ref 70–99)
Potassium: 3.7 mmol/L (ref 3.5–5.1)
Sodium: 140 mmol/L (ref 135–145)

## 2023-10-18 LAB — CULTURE, BLOOD (ROUTINE X 2)
Culture  Setup Time: NO GROWTH
Special Requests: ADEQUATE

## 2023-10-18 MED ORDER — SULFAMETHOXAZOLE-TRIMETHOPRIM 800-160 MG PO TABS: 1.0000 | ORAL_TABLET | Freq: Two times a day (BID) | ORAL | 0 refills | Status: AC

## 2023-10-18 MED ORDER — TAMSULOSIN HCL 0.4 MG PO CAPS: 0.4000 mg | ORAL_CAPSULE | Freq: Every day | ORAL | 0 refills | Status: DC

## 2023-10-18 NOTE — Consult Note (Signed)
 NAME: Jason Washington  DOB: 1945/11/02  MRN: [id number]  Date/Time: 10/18/2023 2:17 PM  REQUESTING PROVIDER; Dr.Agbata Subjective:  REASON FOR CONSULT: Proteus bacteremia Patient is a limited historian.  Spoke to his wife on the phone. Jason Washington Jason Washington is a 78 y.o. with a history of ureteric stone, type hypertension, hyperlipidemia, diabetes, CHF, prostate cancer, dementia, presented from home with confusion on 10/14/2023.  Apparently he was unwell that day and slept most of the day and then he tried to go to [employer] but could not reverse his truck and hence EMS because called and he was brought to the hospital Vitals in the ED  10/14/23 20:04  BP 163/128 (H)  Temp 102.1 F (38.9 C) !  Pulse Rate 114 !  Resp 18  SpO2 96 %   Labs   Latest Reference Range & Units 10/14/23 20:12  WBC 4.0 - 10.5 K/uL 15.2 (H)  Hemoglobin 13.0 - 17.0 g/dL 85.1  HCT 60.9 - 47.9 % 44.1  Platelets 150 - 400 K/uL 276  Creatinine 0.61 - 1.24 mg/dL 9.07   Blood culture sent.Started on IV ceftriaxone  CT renal study showed left kidney which was normal..  on the right side there was a 4 mm obstructive stone within the mid right ureter with secondary mild right hydro ureteral nephrosis.  There was associated perinephric and periureteral fat stranding.  Patient was taken to the OR and underwent stent placement by urologist. Asked to see the patient because the blood culture was positive for Proteus It is susceptible to p.o. quinolone and p.o. sulfa  He has a thoracic aortic aneurysm of 4.5 cm  Past Medical History:  Diagnosis Date   Actinic keratosis    Basal cell carcinoma 11/21/2019   right superior lat pectoral/ EDC   Basal cell carcinoma 08/12/2022   Right sideburn. Mohs pending.   Chronic GERD    Diabetes mellitus without complication (HCC)    Genital herpes    Hepatic steatosis    HLD (hyperlipidemia)    Hypertension    Imbalance    Melanoma (HCC)    R knee several years ago    Mild  cognitive impairment with memory loss    Neuropathy    Prostate CA (HCC)    Sleep apnea    Squamous cell carcinoma of skin 12/22/2021   left forearm-anterior treated wiht [facility]   Thoracic aortic aneurysm [facility])     Past Surgical History:  Procedure Laterality Date   APPENDECTOMY     CATARACT EXTRACTION W/PHACO Left 08/03/2022   Procedure: CATARACT EXTRACTION PHACO AND INTRAOCULAR LENS PLACEMENT (IOC) LEFT CLAREON PANOPTIX TORIC  9.11  00:47.4;  Surgeon: Jaye Fallow, MD;  Location: [facility];  Service: Ophthalmology;  Laterality: Left;   CATARACT EXTRACTION W/PHACO Right 08/17/2022   Procedure: CATARACT EXTRACTION PHACO AND INTRAOCULAR LENS PLACEMENT (IOC) RIGHT CLAREON PANOPTIX TORIC;  Surgeon: Jaye Fallow, MD;  Location: [facility];  Service: Ophthalmology;  Laterality: Right;  14.56 1:18.0   COLONOSCOPY WITH PROPOFOL  N/A 10/07/2021   Procedure: COLONOSCOPY WITH PROPOFOL ;  Surgeon: Toledo, Ladell POUR, MD;  Location: [facility] ENDOSCOPY;  Service: Endoscopy;  Laterality: N/A;   CYSTOSCOPY WITH STENT PLACEMENT Right 10/16/2023   Procedure: CYSTOSCOPY, WITH STENT INSERTION;  Surgeon: Sherrilee Belvie CROME, MD;  Location: [facility] ORS;  Service: Urology;  Laterality: Right;   HERNIA REPAIR     PROSTATE SURGERY     TONSILLECTOMY      Social History   Socioeconomic History   Marital  status: Married    Spouse name: Not on file   Number of children: Not on file   Years of education: Not on file   Highest education level: Not on file  Occupational History   Not on file  Tobacco Use   Smoking status: Every Day    Current packs/day: 1.00    Average packs/day: 1 pack/day for 10.0 years (10.0 ttl pk-yrs)    Types: Cigarettes   Smokeless tobacco: Never   Tobacco comments:    2 cigs daily- 10/23/2019  Vaping Use   Vaping status: Never Used  Substance and Sexual Activity   Alcohol use: Yes    Comment: rare   Drug use: Never   Sexual activity: Not on file  Other Topics  Concern   Not on file  Social History Narrative   Not on file   Social Drivers of Health   Financial Resource Strain: Low Risk  (07/24/2023)   Received from [facility]   Overall Financial Resource Strain (CARDIA)    Difficulty of Paying Living Expenses: Not hard at all  Food Insecurity: No Food Insecurity (10/15/2023)   Hunger Vital Sign    Worried About Running Out of Food in the Last Year: Never true    Ran Out of Food in the Last Year: Never true  Transportation Needs: No Transportation Needs (10/15/2023)   PRAPARE - Administrator, Civil Service (Medical): No    Lack of Transportation (Non-Medical): No  Physical Activity: Not on file  Stress: Not on file  Social Connections: Unknown (10/15/2023)   Social Connection and Isolation Panel    Frequency of Communication with Friends and Family: Never    Frequency of Social Gatherings with Friends and Family: Never    Attends Religious Services: Patient unable to answer    Active Member of Clubs or Organizations: Patient unable to answer    Attends Banker Meetings: Patient unable to answer    Marital Status: Patient unable to answer  Intimate Partner Violence: Not At Risk (10/15/2023)   Humiliation, Afraid, Rape, and Kick questionnaire    Fear of Current or Ex-Partner: No    Emotionally Abused: No    Physically Abused: No    Sexually Abused: No    History reviewed. No pertinent family history. No Known Allergies I? Current Facility-Administered Medications  Medication Dose Route Frequency Provider Last Rate Last Admin   acetaminophen  (TYLENOL ) tablet 650 mg  650 mg Oral Q6H PRN McKenzie, Belvie CROME, MD       amLODipine  (NORVASC ) tablet 5 mg  5 mg Oral Daily McKenzie, Belvie CROME, MD   5 mg at 10/18/23 0813   aspirin  EC tablet 81 mg  81 mg Oral Daily Sherrilee Belvie CROME, MD   81 mg at 10/18/23 0813   atorvastatin  (LIPITOR) tablet 40 mg  40 mg Oral Daily Sherrilee Belvie CROME, MD   40 mg at  10/18/23 0813   cefTRIAXone  (ROCEPHIN ) 2 g in sodium chloride  0.9 % 100 mL IVPB  2 g Intravenous Q24H Sherrilee Belvie CROME, MD 200 mL/hr at 10/18/23 0539 2 g at 10/18/23 0539   dexamethasone  (DECADRON ) injection 4 mg  4 mg Intravenous Q12H Sherrilee Belvie CROME, MD   4 mg at 10/18/23 0813   heparin  injection 5,000 Units  5,000 Units Subcutaneous Q8H Sherrilee Belvie CROME, MD   5,000 Units at 10/18/23 0539   hydrALAZINE  (APRESOLINE ) injection 5 mg  5 mg Intravenous Q2H PRN Sherrilee Belvie CROME,  MD       insulin  aspart (novoLOG ) injection 0-5 Units  0-5 Units Subcutaneous QHS Sherrilee Belvie CROME, MD   2 Units at 10/15/23 2121   insulin  aspart (novoLOG ) injection 0-9 Units  0-9 Units Subcutaneous TID WC Sherrilee Belvie CROME, MD   1 Units at 10/17/23 1251   nicotine  (NICODERM CQ  - dosed in mg/24 hours) patch 21 mg  21 mg Transdermal Daily McKenzie, Belvie CROME, MD       ondansetron  (ZOFRAN ) injection 4 mg  4 mg Intravenous Q8H PRN McKenzie, Belvie CROME, MD       pantoprazole  (PROTONIX ) EC tablet 40 mg  40 mg Oral Daily Sherrilee Belvie CROME, MD   40 mg at 10/18/23 0813   tamsulosin  (FLOMAX ) capsule 0.4 mg  0.4 mg Oral QPC supper Sherrilee Belvie CROME, MD   0.4 mg at 10/17/23 1711   Current Outpatient Medications  Medication Sig Dispense Refill   acyclovir (ZOVIRAX) 400 MG tablet Take 400 mg by mouth 2 (two) times daily.     amLODipine  (NORVASC ) 10 MG tablet Take 1 tablet (10 mg total) by mouth daily. 30 tablet 0   aspirin  EC 81 MG tablet Take 81 mg by mouth daily. Swallow whole.     atorvastatin  (LIPITOR) 40 MG tablet atorvastatin  40 mg tablet     budesonide (ENTOCORT EC) 3 MG 24 hr capsule Take 9 mg by mouth daily.     cetirizine (ZYRTEC) 10 MG tablet Take 10 mg by mouth daily. Barkley & Sheryle     [Paused] hydrochlorothiazide (HYDRODIURIL) 25 MG tablet hydrochlorothiazide 25 mg tablet     ketoconazole  (NIZORAL ) 2 % cream Apply 1 Application topically at bedtime. Qhs to feet 60 g 6   mupirocin  ointment  (BACTROBAN ) 2 % Apply 1 Application topically daily. 22 g 0   omeprazole (PRILOSEC) 40 MG capsule omeprazole 40 mg capsule,delayed release     sulfamethoxazole -trimethoprim  (BACTRIM  DS) 800-160 MG tablet Take 1 tablet by mouth 2 (two) times daily for 10 days. 20 tablet 0   [Paused] valsartan (DIOVAN) 80 MG tablet Take 80 mg by mouth daily.     tamsulosin  (FLOMAX ) 0.4 MG CAPS capsule Take 1 capsule (0.4 mg total) by mouth daily after supper. 30 capsule 0     Abtx:  Anti-infectives (From admission, onward)    Start     Dose/Rate Route Frequency Ordered Stop   10/18/23 0000  sulfamethoxazole -trimethoprim  (BACTRIM  DS) 800-160 MG tablet        1 tablet Oral 2 times daily 10/18/23 1049 10/28/23 2359   10/15/23 0600  cefTRIAXone  (ROCEPHIN ) 2 g in sodium chloride  0.9 % 100 mL IVPB        2 g 200 mL/hr over 30 Minutes Intravenous Every 24 hours 10/14/23 2354     10/14/23 2030  ceFEPIme  (MAXIPIME ) 2 g in sodium chloride  0.9 % 100 mL IVPB        2 g 200 mL/hr over 30 Minutes Intravenous  Once 10/14/23 2028 10/14/23 2128   10/14/23 2030  vancomycin  (VANCOCIN ) IVPB 1000 mg/200 mL premix        1,000 mg 200 mL/hr over 60 Minutes Intravenous  Once 10/14/23 2028 10/14/23 2237       REVIEW OF SYSTEMS:  Const: fever, negative chills, negative weight loss Eyes: negative diplopia or visual changes, negative eye pain ENT: negative coryza, negative sore throat Resp: negative cough, hemoptysis, dyspnea Cards: negative for chest pain, palpitations, lower extremity edema GU:  frequency, dysuria  GI: Negative  for abdominal pain, diarrhea, bleeding, constipation Skin: negative for rash and pruritus Heme: negative for easy bruising and gum/nose bleeding MS:  weakness Neurolo: Confusion Psych: negative for feelings of anxiety, depression  Endocrine: negative for thyroid , diabetes Allergy/Immunology- negative for any medication or food allergies ? Objective:  VITALS:  BP (!) 159/84 (BP Location: Right  Arm)   Pulse (!) 54   Temp 98.3 F (36.8 C)   Resp 18   Ht 5' 11 (1.803 m)   Wt 120.3 kg   SpO2 96%   BMI 36.99 kg/m  LDA Foley Central line Other drainage tubes PHYSICAL EXAM:  General: Alert, cooperative, no distress, appears stated age.  Head: Normocephalic, without obvious abnormality, atraumatic. Eyes: Conjunctivae clear, anicteric sclerae. Pupils are equal ENT Nares normal. No drainage or sinus tenderness. Lips, mucosa, and tongue normal. No Thrush Neck: Supple, symmetrical, no adenopathy, thyroid : non tender no carotid bruit and no JVD. Back: No CVA tenderness. Lungs: Clear to auscultation bilaterally. No Wheezing or Rhonchi. No rales. Heart: Regular rate and rhythm, no murmur, rub or gallop. Abdomen: Soft, non-tender,not distended. Bowel sounds normal. No masses Extremities: atraumatic, no cyanosis. No edema. No clubbing Skin: No rashes or lesions. Or bruising Lymph: Cervical, supraclavicular normal. Neurologic: Grossly non-focal Pertinent Labs Lab Results CBC    Component Value Date/Time   WBC 10.0 10/18/2023 0911   RBC 3.77 (L) 10/18/2023 0911   HGB 12.5 (L) 10/18/2023 0911   HCT 36.9 (L) 10/18/2023 0911   PLT 258 10/18/2023 0911   MCV 97.9 10/18/2023 0911   MCH 33.2 10/18/2023 0911   MCHC 33.9 10/18/2023 0911   RDW 12.7 10/18/2023 0911   LYMPHSABS 1.9 10/18/2023 0911   MONOABS 0.6 10/18/2023 0911   EOSABS 0.0 10/18/2023 0911   BASOSABS 0.0 10/18/2023 0911       Latest Ref Rng & Units 10/18/2023    9:11 AM 10/17/2023   10:24 AM 10/16/2023    5:38 AM  CMP  Glucose 70 - 99 mg/dL 851  829  850   BUN 8 - 23 mg/dL 21  20  19    Creatinine 0.61 - 1.24 mg/dL 9.21  9.39  9.27   Sodium 135 - 145 mmol/L 140  137  138   Potassium 3.5 - 5.1 mmol/L 3.7  3.7  3.6   Chloride 98 - 111 mmol/L 102  103  104   CO2 22 - 32 mmol/L 24  22  27    Calcium  8.9 - 10.3 mg/dL 8.9  8.5  9.0       Microbiology: Recent Results (from the past 240 hours)  Blood Culture  (routine x 2)     Status: None (Preliminary result)   Collection Time: 10/14/23  8:12 PM   Specimen: BLOOD  Result Value Ref Range Status   Specimen Description BLOOD BLOOD LEFT FOREARM  Final   Special Requests   Final    BOTTLES DRAWN AEROBIC AND ANAEROBIC Blood Culture results may not be optimal due to an inadequate volume of blood received in culture bottles   Culture   Final    NO GROWTH 4 DAYS Performed at [facility], [address] [place], [place] [address]    Report Status PENDING  Incomplete  Resp panel by RT-PCR (RSV, Flu A&B, Covid) Anterior Nasal Swab     Status: None   Collection Time: 10/14/23  8:45 PM   Specimen: Anterior Nasal Swab  Result Value Ref Range Status   SARS Coronavirus 2 by RT  PCR NEGATIVE NEGATIVE Final    Comment: (NOTE) SARS-CoV-2 target nucleic acids are NOT DETECTED.  The SARS-CoV-2 RNA is generally detectable in upper respiratory specimens during the acute phase of infection. The lowest concentration of SARS-CoV-2 viral copies this assay can detect is 138 copies/mL. A negative result does not preclude SARS-Cov-2 infection and should not be used as the sole basis for treatment or other patient management decisions. A negative result may occur with  improper specimen collection/handling, submission of specimen other than nasopharyngeal swab, presence of viral mutation(s) within the areas targeted by this assay, and inadequate number of viral copies(<138 copies/mL). A negative result must be combined with clinical observations, patient history, and epidemiological information. The expected result is Negative.  Fact Sheet for Patients:  BloggerCourse.com  Fact Sheet for Healthcare Providers:  SeriousBroker.it  This test is no t yet approved or cleared by the [place]  FDA and  has been authorized for detection and/or diagnosis of SARS-CoV-2 by FDA under an Emergency Use  Authorization (EUA). This EUA will remain  in effect (meaning this test can be used) for the duration of the COVID-19 declaration under Section 564(b)(1) of the Act, 21 U.S.C.section 360bbb-3(b)(1), unless the authorization is terminated  or revoked sooner.       Influenza A by PCR NEGATIVE NEGATIVE Final   Influenza B by PCR NEGATIVE NEGATIVE Final    Comment: (NOTE) The Xpert Xpress SARS-CoV-2/FLU/RSV plus assay is intended as an aid in the diagnosis of influenza from Nasopharyngeal swab specimens and should not be used as a sole basis for treatment. Nasal washings and aspirates are unacceptable for Xpert Xpress SARS-CoV-2/FLU/RSV testing.  Fact Sheet for Patients: BloggerCourse.com  Fact Sheet for Healthcare Providers: SeriousBroker.it  This test is not yet approved or cleared by the [place]  FDA and has been authorized for detection and/or diagnosis of SARS-CoV-2 by FDA under an Emergency Use Authorization (EUA). This EUA will remain in effect (meaning this test can be used) for the duration of the COVID-19 declaration under Section 564(b)(1) of the Act, 21 U.S.C. section 360bbb-3(b)(1), unless the authorization is terminated or revoked.     Resp Syncytial Virus by PCR NEGATIVE NEGATIVE Final    Comment: (NOTE) Fact Sheet for Patients: BloggerCourse.com  Fact Sheet for Healthcare Providers: SeriousBroker.it  This test is not yet approved or cleared by the [place]  FDA and has been authorized for detection and/or diagnosis of SARS-CoV-2 by FDA under an Emergency Use Authorization (EUA). This EUA will remain in effect (meaning this test can be used) for the duration of the COVID-19 declaration under Section 564(b)(1) of the Act, 21 U.S.C. section 360bbb-3(b)(1), unless the authorization is terminated or revoked.  Performed at [facility], [address] [place], [place] [address]   Blood Culture (routine x 2)     Status: Abnormal   Collection Time: 10/14/23  8:55 PM   Specimen: BLOOD  Result Value Ref Range Status   Specimen Description   Final    BLOOD BLOOD LEFT ARM Performed at [facility], [address] [place], [place] [address]    Special Requests   Final    BOTTLES DRAWN AEROBIC AND ANAEROBIC Blood Culture adequate volume Performed at [facility], [address] [place], [place] [address]    Culture  Setup Time   Final    GRAM NEGATIVE RODS AEROBIC BOTTLE ONLY CRITICAL RESULT CALLED TO, READ BACK BY AND VERIFIED WITH: IDOLINA PERCY, PHARMD @0211  10/16/2023 COP Performed at  [facility], [address] [place], [place] [address]    Culture PROTEUS MIRABILIS (A)  Final   Report Status 10/18/2023 FINAL  Final   Organism ID, Bacteria PROTEUS MIRABILIS  Final      Susceptibility   Proteus mirabilis - MIC*    AMPICILLIN <=2 SENSITIVE Sensitive     CEFAZOLIN (NON-URINE) 4 INTERMEDIATE Intermediate     CEFEPIME  <=0.12 SENSITIVE Sensitive     ERTAPENEM <=0.12 SENSITIVE Sensitive     CEFTRIAXONE  <=0.25 SENSITIVE Sensitive     CIPROFLOXACIN <=0.06 SENSITIVE Sensitive     GENTAMICIN <=1 SENSITIVE Sensitive     MEROPENEM <=0.25 SENSITIVE Sensitive     TRIMETH /SULFA  <=20 SENSITIVE Sensitive     AMPICILLIN/SULBACTAM <=2 SENSITIVE Sensitive     PIP/TAZO Value in next row Sensitive ug/mL     <=4 SENSITIVEThis is a modified FDA-approved test that has been validated and its performance characteristics determined by the reporting laboratory.  This laboratory is certified under the Clinical Laboratory Improvement Amendments CLIA as qualified to perform high complexity clinical laboratory testing.    * PROTEUS MIRABILIS  Blood Culture ID Panel (Reflexed)     Status: Abnormal   Collection Time: 10/14/23  8:55 PM  Result Value Ref Range Status   Enterococcus faecalis NOT DETECTED NOT DETECTED Final    Enterococcus Faecium NOT DETECTED NOT DETECTED Final   Listeria monocytogenes NOT DETECTED NOT DETECTED Final   Staphylococcus species NOT DETECTED NOT DETECTED Final   Staphylococcus aureus (BCID) NOT DETECTED NOT DETECTED Final   Staphylococcus epidermidis NOT DETECTED NOT DETECTED Final   Staphylococcus lugdunensis NOT DETECTED NOT DETECTED Final   Streptococcus species NOT DETECTED NOT DETECTED Final   Streptococcus agalactiae NOT DETECTED NOT DETECTED Final   Streptococcus pneumoniae NOT DETECTED NOT DETECTED Final   Streptococcus pyogenes NOT DETECTED NOT DETECTED Final   A.calcoaceticus-baumannii NOT DETECTED NOT DETECTED Final   Bacteroides fragilis NOT DETECTED NOT DETECTED Final   Enterobacterales DETECTED (A) NOT DETECTED Final    Comment: Enterobacterales represent a large order of gram negative bacteria, not a single organism. CRITICAL RESULT CALLED TO, READ BACK BY AND VERIFIED WITH: IDOLINA PERCY, PHARMD @0211  10/16/2023 COP    Enterobacter cloacae complex NOT DETECTED NOT DETECTED Final   Escherichia coli NOT DETECTED NOT DETECTED Final   Klebsiella aerogenes NOT DETECTED NOT DETECTED Final   Klebsiella oxytoca NOT DETECTED NOT DETECTED Final   Klebsiella pneumoniae NOT DETECTED NOT DETECTED Final   Proteus species DETECTED (A) NOT DETECTED Final    Comment: CRITICAL RESULT CALLED TO, READ BACK BY AND VERIFIED WITH: IDOLINA PERCY, PHARMD @0211  10/16/2023 COP    Salmonella species NOT DETECTED NOT DETECTED Final   Serratia marcescens NOT DETECTED NOT DETECTED Final   Haemophilus influenzae NOT DETECTED NOT DETECTED Final   Neisseria meningitidis NOT DETECTED NOT DETECTED Final   Pseudomonas aeruginosa NOT DETECTED NOT DETECTED Final   Stenotrophomonas maltophilia NOT DETECTED NOT DETECTED Final   Candida albicans NOT DETECTED NOT DETECTED Final   Candida auris NOT DETECTED NOT DETECTED Final   Candida glabrata NOT DETECTED NOT DETECTED Final   Candida krusei NOT  DETECTED NOT DETECTED Final   Candida parapsilosis NOT DETECTED NOT DETECTED Final   Candida tropicalis NOT DETECTED NOT DETECTED Final   Cryptococcus neoformans/gattii NOT DETECTED NOT DETECTED Final   CTX-M ESBL NOT DETECTED NOT DETECTED Final   Carbapenem resistance IMP NOT DETECTED NOT DETECTED Final   Carbapenem resistance KPC NOT DETECTED NOT DETECTED Final  Carbapenem resistance NDM NOT DETECTED NOT DETECTED Final   Carbapenem resist OXA 48 LIKE NOT DETECTED NOT DETECTED Final   Carbapenem resistance VIM NOT DETECTED NOT DETECTED Final    Comment: Performed at [facility], [address] [place], [place] [address]  Urine Culture     Status: None   Collection Time: 10/14/23 10:36 PM   Specimen: Urine, Random  Result Value Ref Range Status   Specimen Description   Final    URINE, RANDOM Performed at [facility], [address] [place], [place] [address]    Special Requests   Final    NONE Reflexed from [id number] Performed at [facility], [address], [place], [place] [address]    Culture   Final    NO GROWTH Performed at [facility], [address], [place], [place] [address]    Report Status 10/16/2023 FINAL  Final    Lines and Device Date on insertion # of days DC  Central line     Foley     ETT       IMAGING RESULTS: CT renal study examined personally Right ureteric stone with right mild hydro and ureteral nephrosis I have personally reviewed the films ? Impression/Recommendation Proteus bacteremia secondary to complicated UTI because of obstructive uropathy on the right side with hydroureteronephrosis Status post stent placement Patient is currently on ceftriaxone  The Proteus is susceptible to ciprofloxacin and sulfa  But he has a thoracic aneurysm of 4.5 cm and hence we will avoid quinolones So he could be discharged on p.o. Bactrim  double strength 1 twice daily for 2 weeks total He will have to follow-up with urology  for lithotripsy and stent exchange While on Bactrim  need to check potassium and creatinine in a few days Discussed the management with the patient and his wife.  Encephalopathy secondary to infection has resolved  Hypertension management as per primary team  Diabetes mellitus manage as per primary team  This consult involved complex antimicrobial management. _I have personally spent  -75--minutes involved in face-to-face and non-face-to-face activities for this patient on the day of the visit. Professional time spent includes the following activities: Preparing to see the patient (review of tests), Obtaining and/or reviewing separately obtained history (admission/discharge record), Performing a medically appropriate examination and/or evaluation , Ordering medications/tests/procedures, referring and communicating with other health care professionals, Documenting clinical information in the EMR, Independently interpreting results (not separately reported), Communicating results to the patient/family/caregiver, Counseling and educating the patient/family/caregiver and Care coordination (not separately reported).    ________________________________________________ Discussed with patient, requesting provider Note:  This document was prepared using Dragon voice recognition software and may include unintentional dictation errors.

## 2023-10-18 NOTE — Progress Notes (Signed)
 Brief Urology Progress Note  I was unable to speak with the patient today before he discharged home.  This afternoon, I called and spoke with his wife via telephone; patient was asleep.  She reports some dysuria, but overall he is rather comfortable.  He was discharged on Bactrim  and Flomax .  We discussed that he will require follow-up right ureteroscopy with laser lithotripsy and stent exchange with Dr. Francisca.  I explained this process to her several times and we discussed that his new stent would remain in place for 3 to 10 days depending on intraoperative findings.  I offered them an office visit with Dr. Francisca to discuss this further before surgical scheduling, but she declined.  They are ready for scheduling.  Have notified Melissa.  Naythen Heikkila, PA-C 10/18/23  4:27 PM

## 2023-10-18 NOTE — Progress Notes (Unsigned)
 Surgical Physician Order Form [facility] [place]  Dr. Redell Burnet, MD  * Scheduling expectation : 2 to 4 weeks  *Length of Case: 1 hour  *Clearance needed: no  *Anticoagulation Instructions: May continue all anticoagulants  *Aspirin  Instructions: Ok to continue Aspirin   *Post-op visit Date/Instructions:  1 week cysto stent removal  *Diagnosis: Right Ureteral Stone  *Procedure: right  Ureteroscopy w/laser lithotripsy & stent exchange (47643)   Additional orders: N/A  -Admit type: OUTpatient  -Anesthesia: General  -VTE Prophylaxis Standing Order SCD's       Other:   -Standing Lab Orders Per Anesthesia    Lab other: None  -Standing Test orders EKG/Chest x-ray per Anesthesia       Test other:   - Medications:  cipro 400mg  iv  -Other orders:  N/A

## 2023-10-18 NOTE — Progress Notes (Signed)
 Physical Therapy Treatment Patient Details Name: Jason Washington MRN: [id number] DOB: 23-Nov-1945 Today's Date: 10/18/2023   History of Present Illness Pt is a 78 y/o M admitted on 10/14/23 after presenting with AMS. Pt is being treated for sepsis 2/2 UTI, acute metabolic encephalopathy. PMH: 3mm R ureteral stone with hydronephrosis, HTN, HLD, DM, dCHF, dementia, prostate CA, obesity, smoker, meningioma    PT Comments  Pt was pleasant and motivated to participate during the session and put forth good effort throughout. Pt demonstrated good control and stability with transfers and with ascending/descending steps this session with only minimal assist from UEs.  Pt was able to ambulate without an AD with only min drifting left/right and with mildly reduced cadence but no overt LOB and no adverse symptoms reported.  Pt participated with below unsupported standing balance activities with some very mild sway but no overt LOB.  Pt will benefit from continued PT services upon discharge to safely address deficits listed in patient problem list for decreased caregiver assistance and eventual return to PLOF.      If plan is discharge home, recommend the following: A little help with walking and/or transfers;A little help with bathing/dressing/bathroom;Assistance with cooking/housework;Direct supervision/assist for medications management;Direct supervision/assist for financial management;Assist for transportation;Help with stairs or ramp for entrance;Supervision due to cognitive status   Can travel by private vehicle        Equipment Recommendations  None recommended by PT    Recommendations for Other Services       Precautions / Restrictions Precautions Precautions: Fall Restrictions Weight Bearing Restrictions Per Provider Order: No     Mobility  Bed Mobility Overal bed mobility: Modified Independent             General bed mobility comments: Min extra time and effort only     Transfers Overall transfer level: Needs assistance Equipment used: None   Sit to Stand: Supervision           General transfer comment: Good eccentric and concentric control and stability with minimal use of UEs to assist    Ambulation/Gait Ambulation/Gait assistance: Contact guard assist Gait Distance (Feet): 125 Feet x 2 Assistive device: None Gait Pattern/deviations: Decreased step length - right, Decreased step length - left, Step-through pattern, Drifts right/left Gait velocity: decreased     General Gait Details: Mildly reduced cadence with some minor drifting left/right but generally steady with no overt LOB   Stairs Stairs: Yes Stairs assistance: Contact guard assist Stair Management: One rail Right, Alternating pattern, Forwards Number of Stairs: 5 General stair comments: Good eccentric and concentric control ascending/descending steps with alternating pattern and only min lean on the rail for support   Wheelchair Mobility     Tilt Bed    Modified Rankin (Stroke Patients Only)       Balance Overall balance assessment: Needs assistance   Sitting balance-Leahy Scale: Normal     Standing balance support: During functional activity, No upper extremity supported Standing balance-Leahy Scale: Good                              Communication Communication Communication: No apparent difficulties  Cognition Arousal: Alert Behavior During Therapy: WFL for tasks assessed/performed   PT - Cognitive impairments: No apparent impairments                                Cueing  Cueing Techniques: Verbal cues  Exercises Other Exercises Other Exercises: Static standing balance training with feet apart, together, and semi-tandem with combinations of eyes open/closed and head still/head turns    General Comments        Pertinent Vitals/Pain Pain Assessment Pain Assessment: No/denies pain    Home Living                           Prior Function            PT Goals (current goals can now be found in the care plan section) Progress towards PT goals: Progressing toward goals    Frequency    Min 2X/week      PT Plan      Co-evaluation              AM-PAC PT 6 Clicks Mobility   Outcome Measure  Help needed turning from your back to your side while in a flat bed without using bedrails?: None Help needed moving from lying on your back to sitting on the side of a flat bed without using bedrails?: None Help needed moving to and from a bed to a chair (including a wheelchair)?: A Little Help needed standing up from a chair using your arms (e.g., wheelchair or bedside chair)?: A Little Help needed to walk in hospital room?: A Little Help needed climbing 3-5 steps with a railing? : A Little 6 Click Score: 20    End of Session Equipment Utilized During Treatment: Gait belt Activity Tolerance: Patient tolerated treatment well Patient left: in bed;with call bell/phone within reach;with bed alarm set Nurse Communication: Mobility status PT Visit Diagnosis: Unsteadiness on feet (R26.81);Muscle weakness (generalized) (M62.81);Other abnormalities of gait and mobility (R26.89)     Time: 9053-8990 PT Time Calculation (min) (ACUTE ONLY): 23 min  Charges:    $Gait Training: 8-22 mins $Therapeutic Exercise: 8-22 mins PT General Charges $$ ACUTE PT VISIT: 1 Visit                     D. Scott Edris Friedt PT, DPT 10/18/23, 11:29 AM

## 2023-10-18 NOTE — Progress Notes (Signed)
 Mobility Specialist Progress Note:    10/18/23 0946  Mobility  Activity Ambulated with assistance  Level of Assistance Contact guard assist, steadying assist  Assistive Device None  Distance Ambulated (ft) 100 ft  Range of Motion/Exercises Active;All extremities  Activity Response Tolerated well  Mobility visit 1 Mobility  Mobility Specialist Start Time (ACUTE ONLY) 0930  Mobility Specialist Stop Time (ACUTE ONLY) 0946  Mobility Specialist Time Calculation (min) (ACUTE ONLY) 16 min   Pt received requesting assistance to bathroom, agreeable to further mobility. Required CGA/SBA to stand and ambulate with no AD. Tolerated well, HR 64 bpm after ambulation. Returned supine, all needs met.   Sherrilee Ditty Mobility Specialist Please contact via Special educational needs teacher or  Rehab office at [phone]

## 2023-10-18 NOTE — TOC Initial Note (Signed)
 Transition of Care [facility]) - Initial/Assessment Note    Patient Details  Name: Jason Washington MRN: [id number] Date of Birth: Apr 10, 1945  Transition of Care [facility]) CM/SW Contact:    Corean ONEIDA Haddock, RN Phone Number: 10/18/2023, 11:26 AM  Clinical Narrative:                  Admitted from: Home with wife  ERE:[id number]  Current home health/prior home health/DME: Shower seat and a walking stick  Met with patient and wife at bedside Therapy recommending out patient PT.  MD has ordered [facility], and patient prefers HH.   Wife states they do not have a preference of HH agency.  Referral made to [facility] with [facility] . Patient and wife requesting rollator.  Referral made to [facility] with [facility] to be delivered to room        Patient Goals and CMS Choice            Expected Discharge Plan and Services         Expected Discharge Date: 10/18/23                                    Prior Living Arrangements/Services                       Activities of Daily Living   ADL Screening (condition at time of admission) Independently performs ADLs?: Yes (appropriate for developmental age) Is the patient deaf or have difficulty hearing?: Yes Does the patient have difficulty seeing, even when wearing glasses/contacts?: Yes Does the patient have difficulty concentrating, remembering, or making decisions?: Yes  Permission Sought/Granted                  Emotional Assessment              Admission diagnosis:  Sepsis due to urinary tract infection (HCC) [A41.9, N39.0] Urinary tract infection without hematuria, site unspecified [N39.0] Altered mental status, unspecified altered mental status type [R41.82] Sepsis, due to unspecified organism, unspecified whether acute organ dysfunction present [facility]) [A41.9] Patient Active Problem List   Diagnosis Date Noted   Ascending aortic aneurysm (HCC) 10/18/2023   Bacteremia due to Proteus species 10/18/2023   Meningioma  (HCC) 10/15/2023   Smoker 10/15/2023   Sepsis due to urinary tract infection (HCC) 10/14/2023   Acute metabolic encephalopathy 10/14/2023   Chronic diastolic CHF (congestive heart failure) (HCC) 10/14/2023   Myocardial injury 10/14/2023   Obesity (BMI 30-39.9) 10/14/2023   Hypertension    Mild cognitive impairment with memory loss    Sleep apnea    HLD (hyperlipidemia)    Diabetes mellitus without complication (HCC)    Dyspnea on exertion 11/05/2019   Witnessed episode of apnea 11/05/2019   Pulmonary nodules 11/05/2019   PCP:  Franchot Houston, PA-C Pharmacy:   [facility] [address] [place] - [address] [place] [address] Phone: [phone] Fax: [phone]  [facility] - [place], [place] - [address] [place] [address] Phone: [phone] Fax: [phone]  [facility] - [place] , [place] - [address] [place]  [place] [address] Phone: [phone] Fax: [phone]     Social Drivers of Health (SDOH) Social History: SDOH Screenings   Food Insecurity: No Food Insecurity (10/15/2023)  Housing:  Low Risk  (10/15/2023)  Transportation Needs: No Transportation Needs (10/15/2023)  Utilities: Not At Risk (10/15/2023)  Financial Resource Strain: Low Risk  (07/24/2023)   Received from [facility]  Social Connections: Unknown (10/15/2023)  Tobacco Use: High Risk (10/16/2023)   SDOH Interventions:     Readmission Risk Interventions     No data to display

## 2023-10-18 NOTE — Progress Notes (Signed)
 [facility] CARDIOLOGY PROGRESS NOTE       Patient ID: Jason Washington MRN: [id number] DOB/AGE: 79-May-1947 79 y.o.  Admit date: 10/14/2023 Referring Physician Dr. Edythe Primary Physician Franchot Houston, PA-C Primary Cardiologist Dr. Wilburn Reason for Consultation bradycardia  HPI: Jason Washington is a 78 y.o. male  with a past medical history of CAD (per coronary calcification CT chest 2024; normal stress test on 06/2023), hypertension, hyperlipidemia, OSA, meningioma, tobacco abuse, obesity, dementia who presented to the ED on 10/14/2023 for confusion/AMS in setting of UTI with sepsis.  Cardiology was consulted for further evaluation, possible secondary AV block, denies any chest pain, palpitations, SOB, lightheadedness/dizziness or syncope.  Interval History: -Patient seen and examined this AM and today at in hospital bed. Patient states he feels well and denies any chest pain, SOB, lightheadedness or dizziness.  -Patients BP elevated and HR stable in 50s this AM.  Per telemetry in sinus rhythm with first-degree AVB, rate stable in 50s.  There are pauses, all less than 2.4 seconds.  No evidence of significant pauses, high-grade AV block or significant bradycardia.  Has remained asymptomatic. -Patient remains on room air with stable SpO2.    Review of systems complete and found to be negative unless listed above    Past Medical History:  Diagnosis Date   Actinic keratosis    Basal cell carcinoma 11/21/2019   right superior lat pectoral/ EDC   Basal cell carcinoma 08/12/2022   Right sideburn. Mohs pending.   Chronic GERD    Diabetes mellitus without complication (HCC)    Genital herpes    Hepatic steatosis    HLD (hyperlipidemia)    Hypertension    Imbalance    Melanoma (HCC)    R knee several years ago    Mild cognitive impairment with memory loss    Neuropathy    Prostate CA (HCC)    Sleep apnea    Squamous cell carcinoma of skin 12/22/2021   left  forearm-anterior treated wiht [facility]   Thoracic aortic aneurysm [facility]     Past Surgical History:  Procedure Laterality Date   APPENDECTOMY     CATARACT EXTRACTION W/PHACO Left 08/03/2022   Procedure: CATARACT EXTRACTION PHACO AND INTRAOCULAR LENS PLACEMENT (IOC) LEFT CLAREON PANOPTIX TORIC  9.11  00:47.4;  Surgeon: Jaye Fallow, MD;  Location: [facility];  Service: Ophthalmology;  Laterality: Left;   CATARACT EXTRACTION W/PHACO Right 08/17/2022   Procedure: CATARACT EXTRACTION PHACO AND INTRAOCULAR LENS PLACEMENT (IOC) RIGHT CLAREON PANOPTIX TORIC;  Surgeon: Jaye Fallow, MD;  Location: [facility];  Service: Ophthalmology;  Laterality: Right;  14.56 1:18.0   COLONOSCOPY WITH PROPOFOL  N/A 10/07/2021   Procedure: COLONOSCOPY WITH PROPOFOL ;  Surgeon: Toledo, Ladell POUR, MD;  Location: [facility] ENDOSCOPY;  Service: Endoscopy;  Laterality: N/A;   CYSTOSCOPY WITH STENT PLACEMENT Right 10/16/2023   Procedure: CYSTOSCOPY, WITH STENT INSERTION;  Surgeon: Sherrilee Belvie CROME, MD;  Location: [facility] ORS;  Service: Urology;  Laterality: Right;   HERNIA REPAIR     PROSTATE SURGERY     TONSILLECTOMY      Medications Prior to Admission  Medication Sig Dispense Refill Last Dose/Taking   acyclovir (ZOVIRAX) 400 MG tablet Take 400 mg by mouth 2 (two) times daily.   Taking   amLODipine  (NORVASC ) 10 MG tablet Take 1 tablet (10 mg total) by mouth daily. 30 tablet 0 Taking   aspirin  EC 81 MG tablet Take 81 mg by mouth daily. Swallow whole.   Taking  atorvastatin  (LIPITOR) 40 MG tablet atorvastatin  40 mg tablet   Taking   budesonide (ENTOCORT EC) 3 MG 24 hr capsule Take 9 mg by mouth daily.   Taking   cetirizine (ZYRTEC) 10 MG tablet Take 10 mg by mouth daily. Barkley & Sheryle   Taking   hydrochlorothiazide (HYDRODIURIL) 25 MG tablet hydrochlorothiazide 25 mg tablet   Taking   ketoconazole  (NIZORAL ) 2 % cream Apply 1 Application topically at bedtime. Qhs to feet 60 g 6 Unknown   mupirocin  ointment  (BACTROBAN ) 2 % Apply 1 Application topically daily. 22 g 0 Unknown   omeprazole (PRILOSEC) 40 MG capsule omeprazole 40 mg capsule,delayed release   Taking   valsartan (DIOVAN) 80 MG tablet Take 80 mg by mouth daily.   Taking   donepezil  (ARICEPT ) 10 MG tablet Take 10 mg by mouth at bedtime. (Patient not taking: Reported on 10/17/2023)   Not Taking   fluorouracil  (EFUDEX ) 5 % cream Apply to scalp twice a day x 10 days, then stop (Patient not taking: Reported on 10/17/2023) 30 g 1 Not Taking   Social History   Socioeconomic History   Marital status: Married    Spouse name: Not on file   Number of children: Not on file   Years of education: Not on file   Highest education level: Not on file  Occupational History   Not on file  Tobacco Use   Smoking status: Every Day    Current packs/day: 1.00    Average packs/day: 1 pack/day for 10.0 years (10.0 ttl pk-yrs)    Types: Cigarettes   Smokeless tobacco: Never   Tobacco comments:    2 cigs daily- 10/23/2019  Vaping Use   Vaping status: Never Used  Substance and Sexual Activity   Alcohol use: Yes    Comment: rare   Drug use: Never   Sexual activity: Not on file  Other Topics Concern   Not on file  Social History Narrative   Not on file   Social Drivers of Health   Financial Resource Strain: Low Risk  (07/24/2023)   Received from [facility]   Overall Financial Resource Strain (CARDIA)    Difficulty of Paying Living Expenses: Not hard at all  Food Insecurity: No Food Insecurity (10/15/2023)   Hunger Vital Sign    Worried About Running Out of Food in the Last Year: Never true    Ran Out of Food in the Last Year: Never true  Transportation Needs: No Transportation Needs (10/15/2023)   PRAPARE - Administrator, Civil Service (Medical): No    Lack of Transportation (Non-Medical): No  Physical Activity: Not on file  Stress: Not on file  Social Connections: Unknown (10/15/2023)   Social Connection and Isolation  Panel    Frequency of Communication with Friends and Family: Never    Frequency of Social Gatherings with Friends and Family: Never    Attends Religious Services: Patient unable to answer    Active Member of Clubs or Organizations: Patient unable to answer    Attends Banker Meetings: Patient unable to answer    Marital Status: Patient unable to answer  Intimate Partner Violence: Not At Risk (10/15/2023)   Humiliation, Afraid, Rape, and Kick questionnaire    Fear of Current or Ex-Partner: No    Emotionally Abused: No    Physically Abused: No    Sexually Abused: No    History reviewed. No pertinent family history.   Vitals:   10/17/23  2107 10/18/23 0440 10/18/23 0443 10/18/23 0753  BP: (!) 160/74 (!) 172/76  (!) 159/84  Pulse: (!) 53 (!) 55  (!) 54  Resp: 17 17  18   Temp: 98.2 F (36.8 C) 98.1 F (36.7 C)  98.3 F (36.8 C)  TempSrc: Oral Oral    SpO2: 95% 93%  96%  Weight:   120.3 kg   Height:        PHYSICAL EXAM General: Well-appearing elderly male, well nourished, in no acute distress. HEENT: Normocephalic and atraumatic. Neck: No JVD.   Lungs: Normal respiratory effort on room air. Clear bilaterally to auscultation. No wheezes, crackles, rhonchi.  Heart: HRR, slow rates. Normal S1 and S2 without gallops or murmurs.  Abdomen: Non-distended appearing.  Msk: Normal strength and tone for age. Extremities: Warm and well perfused. No clubbing, cyanosis, edema.  Neuro: Alert and oriented X 3. Psych: Answers questions appropriately.   Labs: Basic Metabolic Panel: Recent Labs    10/16/23 0538 10/17/23 1024 10/18/23 0911  NA 138 137 140  K 3.6 3.7 3.7  CL 104 103 102  CO2 27 22 24   GLUCOSE 149* 170* 148*  BUN 19 20 21   CREATININE 0.72 0.60* 0.78  CALCIUM  9.0 8.5* 8.9  MG 1.9  --   --    Liver Function Tests: No results for input(s): AST, ALT, ALKPHOS, BILITOT, PROT, ALBUMIN in the last 72 hours. No results for input(s): LIPASE,  AMYLASE in the last 72 hours. CBC: Recent Labs    10/17/23 1024 10/18/23 0911  WBC 11.6* 10.0  NEUTROABS 9.5* 7.3  HGB 12.3* 12.5*  HCT 36.9* 36.9*  MCV 98.7 97.9  PLT 239 258   Cardiac Enzymes: No results for input(s): CKTOTAL, CKMB, CKMBINDEX, TROPONINIHS in the last 72 hours. BNP: No results for input(s): BNP in the last 72 hours. D-Dimer: No results for input(s): DDIMER in the last 72 hours. Hemoglobin A1C: No results for input(s): HGBA1C in the last 72 hours. Fasting Lipid Panel: No results for input(s): CHOL, HDL, LDLCALC, TRIG, CHOLHDL, LDLDIRECT in the last 72 hours. Thyroid  Function Tests: No results for input(s): TSH, T4TOTAL, T3FREE, THYROIDAB in the last 72 hours.  Invalid input(s): FREET3 Anemia Panel: No results for input(s): VITAMINB12, FOLATE, FERRITIN, TIBC, IRON, RETICCTPCT in the last 72 hours.   Radiology: CT CHEST WO CONTRAST Result Date: 10/17/2023 CLINICAL DATA:  Pneumonia EXAM: CT CHEST WITHOUT CONTRAST TECHNIQUE: Multidetector CT imaging of the chest was performed following the standard protocol without IV contrast. RADIATION DOSE REDUCTION: This exam was performed according to the departmental dose-optimization program which includes automated exposure control, adjustment of the mA and/or kV according to patient size and/or use of iterative reconstruction technique. COMPARISON:  07/27/2022 FINDINGS: Cardiovascular: Extensive, predominantly left anterior descending, coronary artery calcification. Global cardiac size ismildly enlarged with particular right ventricular enlargement, similar prior examination. No pericardial effusion. Central pulmonary arteries are enlarged in keeping with changes of pulmonary arterial hypertension, stable since prior examination. Mild atherosclerotic calcification within the thoracic aorta. Stable fusiform dilation of the ascending thoracic aorta measuring 4.0 cm in maximal  diameter. Mediastinum/Nodes: No enlarged mediastinal or axillary lymph nodes. Thyroid  gland, trachea, and esophagus demonstrate no significant findings. Small hiatal hernia Lungs/Pleura: Multiple subpleural pulmonary nodules within the visualized lung bases measuring up to 5 mm (99/4) are stable since prior examination and safely considered benign. Mild bandlike atelectasis within the lingula. No focal consolidation. No pneumothorax or pleural effusion. Central airways are widely patent. Upper Abdomen: Multiple cysts are seen  scattered throughout the liver. No acute abnormality Musculoskeletal: No acute bone abnormality. No lytic or blastic bone lesion. Osseous structures are age appropriate. IMPRESSION: 1. No acute intrathoracic pathology identified. No focal pulmonary infiltrate. 2. Extensive, predominantly left anterior descending, coronary artery calcification. 3. Mild global cardiac size with particular right ventricular enlargement, similar prior examination. 4. Morphologic changes in keeping with pulmonary arterial hypertension. 5. Stable fusiform dilation of the ascending thoracic aorta measuring 4.0 cm in maximal diameter. Recommend annual imaging followup by CTA or MRA. This recommendation follows 2010 ACCF/AHA/AATS/ACR/ASA/SCA/SCAI/SIR/STS/SVM Guidelines for the Diagnosis and Management of Patients with Thoracic Aortic Disease. Circulation. 2010; 121: Z733-z630. Aortic aneurysm NOS (ICD10-I71.9) 6. Multiple subpleural pulmonary nodules within the visualized lung bases measuring up to 5 mm, stable since prior examination and safely considered benign. 7. Aortic atherosclerosis. Aortic Atherosclerosis (ICD10-I70.0). Electronically Signed   By: Dorethia Molt M.D.   On: 10/17/2023 19:57   ECHOCARDIOGRAM COMPLETE Result Date: 10/16/2023    ECHOCARDIOGRAM REPORT   Patient Name:   BRECKYN TICAS Date of Exam: 10/16/2023 Medical Rec #:  [id number]             Height:       71.0 in Accession #:     [id number]            Weight:       257.1 lb Date of Birth:  1945/03/03             BSA:          2.346 m Patient Age:    78 years              BP:           157/71 mmHg Patient Gender: M                     HR:           66 bpm. Exam Location:  [facility] Procedure: 2D Echo, Cardiac Doppler and Color Doppler (Both Spectral and Color            Flow Doppler were utilized during procedure). Indications:     Atrial Fibrillation I48.91  History:         Patient has no prior history of Echocardiogram examinations.  Sonographer:     Thedora Louder RDCS, FASE Referring Phys:  [id number] UNRYLXTL AGBATA Diagnosing Phys: Denyse Bathe  Sonographer Comments: Technically difficult study due to poor echo windows, patient is obese and suboptimal parasternal window. Image acquisition challenging due to patient body habitus and Image acquisition challenging due to respiratory motion. The patient declined the use of Definity IV ultrasound imaging agent at this time. IMPRESSIONS  1. Left ventricular ejection fraction, by estimation, is 55 to 60%. The left ventricle has normal function. The left ventricle has no regional wall motion abnormalities. Left ventricular diastolic parameters were normal.  2. Right ventricular systolic function is normal. The right ventricular size is normal.  3. The mitral valve is normal in structure. Trivial mitral valve regurgitation. No evidence of mitral stenosis.  4. The aortic valve is normal in structure. Aortic valve regurgitation is mild. Aortic valve sclerosis/calcification is present, without any evidence of aortic stenosis.  5. The inferior vena cava is normal in size with greater than 50% respiratory variability, suggesting right atrial pressure of 3 mmHg. FINDINGS  Left Ventricle: Left ventricular ejection fraction, by estimation, is 55 to 60%. The left ventricle has normal function. The left ventricle has no regional  wall motion abnormalities. Strain was performed and the global longitudinal strain  is indeterminate. The left ventricular internal cavity size was normal in size. There is no left ventricular hypertrophy. Left ventricular diastolic parameters were normal. Right Ventricle: The right ventricular size is normal. No increase in right ventricular wall thickness. Right ventricular systolic function is normal. Left Atrium: Left atrial size was normal in size. Right Atrium: Right atrial size was normal in size. Pericardium: There is no evidence of pericardial effusion. Mitral Valve: The mitral valve is normal in structure. Trivial mitral valve regurgitation. No evidence of mitral valve stenosis. Tricuspid Valve: The tricuspid valve is normal in structure. Tricuspid valve regurgitation is trivial. No evidence of tricuspid stenosis. Aortic Valve: The aortic valve is normal in structure. Aortic valve regurgitation is mild. Aortic regurgitation PHT measures 642 msec. Aortic valve sclerosis/calcification is present, without any evidence of aortic stenosis. Aortic valve peak gradient measures 9.2 mmHg. Pulmonic Valve: The pulmonic valve was normal in structure. Pulmonic valve regurgitation is not visualized. No evidence of pulmonic stenosis. Aorta: The aortic root is normal in size and structure. Venous: The inferior vena cava is normal in size with greater than 50% respiratory variability, suggesting right atrial pressure of 3 mmHg. IAS/Shunts: No atrial level shunt detected by color flow Doppler. Additional Comments: 3D was performed not requiring image post processing on an independent workstation and was indeterminate.  LEFT VENTRICLE PLAX 2D LVIDd:         5.10 cm   Diastology LVIDs:         3.60 cm   LV e' medial:    8.49 cm/s LV PW:         1.10 cm   LV E/e' medial:  13.3 LV IVS:        1.20 cm   LV e' lateral:   10.00 cm/s LVOT diam:     2.00 cm   LV E/e' lateral: 11.3 LV SV:         107 LV SV Index:   46 LVOT Area:     3.14 cm  RIGHT VENTRICLE RV Basal diam:  3.30 cm RV S prime:     11.00 cm/s TAPSE  (M-mode): 1.9 cm LEFT ATRIUM             Index        RIGHT ATRIUM           Index LA diam:        3.40 cm 1.45 cm/m   RA Area:     11.20 cm LA Vol (A2C):   69.1 ml 29.46 ml/m  RA Volume:   23.60 ml  10.06 ml/m LA Vol (A4C):   42.1 ml 17.95 ml/m LA Biplane Vol: 55.6 ml 23.70 ml/m  AORTIC VALVE                 PULMONIC VALVE AV Area (Vmax): 3.02 cm     PV Vmax:        0.77 m/s AV Vmax:        152.00 cm/s  PV Peak grad:   2.4 mmHg AV Peak Grad:   9.2 mmHg     RVOT Peak grad: 3 mmHg LVOT Vmax:      146.00 cm/s LVOT Vmean:     98.300 cm/s LVOT VTI:       0.340 m AI PHT:         642 msec  AORTA Ao Root diam: 3.70 cm MITRAL VALVE  TRICUSPID VALVE MV Area (PHT): 3.06 cm     TR Peak grad:   27.5 mmHg MV Decel Time: 248 msec     TR Vmax:        262.00 cm/s MV E velocity: 113.00 cm/s MV A velocity: 89.40 cm/s   SHUNTS MV E/A ratio:  1.26         Systemic VTI:  0.34 m                             Systemic Diam: 2.00 cm Denyse Bathe Electronically signed by Denyse Bathe Signature Date/Time: 10/16/2023/11:18:57 AM    Final    DG OR UROLOGY CYSTO IMAGE ([facility] ONLY) Result Date: 10/16/2023 There is no interpretation for this exam.  This order is for images obtained during a surgical procedure.  Please See Surgeries Tab for more information regarding the procedure.   CT RENAL STONE STUDY Result Date: 10/15/2023 CLINICAL DATA:  Initial evaluation for acute abdominal/flank pain. EXAM: CT ABDOMEN AND PELVIS WITHOUT CONTRAST TECHNIQUE: Multidetector CT imaging of the abdomen and pelvis was performed following the standard protocol without IV contrast. RADIATION DOSE REDUCTION: This exam was performed according to the departmental dose-optimization program which includes automated exposure control, adjustment of the mA and/or kV according to patient size and/or use of iterative reconstruction technique. COMPARISON:  None Available. FINDINGS: Lower chest: Few scattered pulmonary nodules noted about the  visualized lung bases, largest of which measures 7 mm at the subpleural peripheral left lower lobe (series 4, image 18). Hepatobiliary: Multiple scattered hypodensities noted within the liver, incompletely characterized on this noncontrast examination, but favored to reflect small cysts. Gallbladder within normal limits. No biliary dilatation. Pancreas: Pancreas within normal limits. Previously noted subcentimeter pancreatic cyst not well seen on this noncontrast examination. Spleen: Spleen within normal limits. Adrenals/Urinary Tract: Mild diffuse thickening of the adrenal glands without discrete lesion. Focal calcification at the left adrenal gland noted as well. Left kidney within normal limits without nephrolithiasis or hydronephrosis. No stone seen along the left renal collecting system. No left-sided hydroureter. On the right, there is a 4 mm obstructive stone within the mid right ureter with secondary mild right hydroureteronephrosis. Associated perinephric and periureteral fat stranding. Few additional nonobstructive right renal calculi measuring up to 7 mm noted. Few scattered small renal cysts noted, a few of which demonstrate intermediate density. These were seen and evaluated on prior MRI from 07/24/2022, demonstrated benign at that time. Bladder is largely decompressed no layering stones within the bladder lumen. Stomach/Bowel: Small hiatal hernia noted. Stomach within normal limits. No evidence for bowel obstruction. No acute inflammatory changes seen about the bowels. Vascular/Lymphatic: Moderate aorto bi-iliac atherosclerotic disease. No enlarged intra-abdominopelvic lymph nodes. Reproductive: Prostate appears to be absent. Other: No free air or fluid. Small bilateral fat containing inguinal hernias without inflammation. Musculoskeletal: No discrete or worrisome osseous lesions. Mild multilevel spondylosis within the visualized thoracolumbar spine. IMPRESSION: 1. 4 mm obstructive stone within the mid  right ureter with secondary mild right hydroureteronephrosis. 2. Few additional nonobstructive right renal calculi measuring up to 7 mm 3. Few scattered pulmonary nodules about the visualized lung bases, largest of which measures 7 mm at the left lower lobe. Per Fleischner Society Guidelines, recommend a non-contrast Chest CT at 3-6 months, then consider another non-contrast Chest CT at 18-24 months. If patient is low risk for malignancy, non-contrast Chest CT at 18-24 months is optional. These guidelines do not apply to immunocompromised  patients and patients with cancer. Follow up in patients with significant comorbidities as clinically warranted. For lung cancer screening, adhere to Lung-RADS guidelines. Reference: Radiology. 2017; 284(1):228-43. Aortic Atherosclerosis (ICD10-I70.0). Electronically Signed   By: Morene Hoard M.D.   On: 10/15/2023 03:47   CT Head Wo Contrast Result Date: 10/14/2023 EXAM: CT HEAD WITHOUT CONTRAST 10/14/2023 10:07:34 PM TECHNIQUE: CT of the head was performed without the administration of intravenous contrast. Automated exposure control, iterative reconstruction, and/or weight based adjustment of the mA/kV was utilized to reduce the radiation dose to as low as reasonably achievable. COMPARISON: Head CT and brain MRI 05/24/2023. CLINICAL HISTORY: Mental status change, unknown cause. Alert and confused. History of meningioma. Febrile with confusion in triage. FINDINGS: BRAIN AND VENTRICLES: Stable appearance of densely calcified mass in the left middle cranial fossa with adjacent mass effect and vasogenic edema. Peripheral cystic component is grossly unchanged in CT appearance. There is 5 mm rightward midline shift of the lateral ventricles, unchanged. Cystic focus in the left aspect of the posterior fossa is unchanged, possible arachnoid cyst. No evidence of acute ischemia, allowing for background chronic change. No acute intracranial hemorrhage. ORBITS: No acute  abnormality. SINUSES: Mixed density mucous retention cyst in the left maxillary sinus, unchanged. SOFT TISSUES AND SKULL: No acute soft tissue abnormality. No skull fracture. IMPRESSION: 1. Stable appearance of densely calcified mass in the left middle cranial fossa with adjacent mass effect and vasogenic edema, and 5 mm rightward midline shift of the lateral ventricles. 2. No acute intracranial abnormality. Electronically signed by: Andrea Gasman MD 10/14/2023 10:40 PM EDT RP Workstation: [id number]   DG Chest Port 1 View Result Date: 10/14/2023 EXAM: 1 VIEW XRAY OF THE CHEST 10/14/2023 09:00:16 PM COMPARISON: 05/24/2023 CLINICAL HISTORY: Questionable sepsis - evaluate for abnormality. Febrile with confusion in triage. FINDINGS: LUNGS AND PLEURA: Mild subsegmental atelectasis in the lung bases. No confluent pulmonary opacity. No pulmonary edema. No pleural effusion. No pneumothorax. HEART AND MEDIASTINUM: Cardiomegaly is stable. Unchanged heart size and mediastinal contours. BONES AND SOFT TISSUES: No acute osseous abnormality. IMPRESSION: 1. Mild subsegmental atelectasis in the lung bases. Electronically signed by: Andrea Gasman MD 10/14/2023 09:08 PM EDT RP Workstation: [id number]    ECHO as above.  TELEMETRY reviewed by me 10/18/2023: Sinus rhythm with first-degree AVB, rate 50s  EKG reviewed by me: Sinus rhythm, first-degree AVB, rate 115 bpm  Data reviewed by me 10/18/2023: last 24h vitals tele labs imaging I/O hospitalist progress notes.  Principal Problem:   Sepsis due to urinary tract infection (HCC) Active Problems:   Hypertension   Mild cognitive impairment with memory loss   Sleep apnea   Acute metabolic encephalopathy   HLD (hyperlipidemia)   Diabetes mellitus without complication (HCC)   Chronic diastolic CHF (congestive heart failure) (HCC)   Myocardial injury   Obesity (BMI 30-39.9)   Meningioma (HCC)   Smoker    ASSESSMENT AND PLAN:  Jason Washington is a 78 y.o.  male  with a past medical history of CAD (per coronary calcification CT chest 2024; normal stress test on 06/2023), hypertension, hyperlipidemia, OSA, meningioma, tobacco abuse, obesity, dementia who presented to the ED on 10/14/2023 for confusion/AMS in setting of UTI with sepsis.  Cardiology was consulted for further evaluation, possible secondary AV block, denies any chest pain, palpitations, SOB, lightheadedness/dizziness or syncope.  # Acute metabolic encephalopathy 2/2 sepsis from UTI - Management per primary team.  # Bradycardia, asymptomatic EKG and tele with sinus rhythm with first-degree AVB, rate stable  in 90s.  There are pauses, all less than 2.4 seconds.  No evidence of significant pauses, high-grade AV block or significant bradycardia.  Has remained asymptomatic. - Avoid AV nodal blockers. - Will place cardiac monitor at discharge for further evaluation.  # Demand ischemia # Coronary artery disease (per CT chest 2024, normal cardiac stress test 06/2023) # Hypertension # Hyperlipidemia -Continue aspirin  81 mg, atorvastatin  40 mg daily. -Continue amlodipine  5 mg daily. -Trops 18 >18, minimally and flat is most consistent with demand/supply mismatch and not ACS.  Echo this admission with preserved EF, no RWMA.   This patient's plan of care was discussed and created with Dr. Florencio and he is in agreement.  Signed: Dorene Comfort, PA-C  10/18/2023, 10:45 AM [facility]

## 2023-10-18 NOTE — Care Management Important Message (Signed)
 Important Message  Patient Details  Name: Jason Washington MRN: [id number] Date of Birth: 06/04/1945   Important Message Given:  Yes - Medicare IM     [name] 10/18/2023, 12:00 PM

## 2023-10-18 NOTE — Discharge Summary (Signed)
 Physician Discharge Summary   Patient: Jason Washington MRN: [id number] DOB: Apr 21, 1945  Admit date:     10/14/2023  Discharge date: 10/18/23  Discharge Physician: Haylei Cobin   PCP: Franchot Houston, PA-C   Recommendations at discharge:   Complete antibiotic therapy as recommended  Hold hydrochlorothiazide and losartan while on Bactrim  Follow-up with urology for stent removal  Discharge Diagnoses: Principal Problem:   Sepsis due to urinary tract infection (HCC) Active Problems:   Acute metabolic encephalopathy   Meningioma (HCC)   Bacteremia due to Proteus species   Hypertension   Chronic diastolic CHF (congestive heart failure) (HCC)   HLD (hyperlipidemia)   Diabetes mellitus without complication (HCC)   Myocardial injury   Mild cognitive impairment with memory loss   Sleep apnea   Obesity (BMI 30-39.9)   Smoker   Ascending aortic aneurysm (HCC)  Resolved Problems:   * No resolved hospital problems. [facility] Course: Jason Washington is a 78 y.o. male with medical history significant of 3 mm right ureteral stone with hydronephrosis (possibly dislodged into bladder 07/24/23 ), HTN, HLD, DM, dCHF, dementia, prostate cancer, obesity, smoker, meningioma, who presents with confusion.   Patient has AMS, and is unable to provide accurate medical history, therefore, most of the history is obtained by discussing the case with ED physician, per EMS report, and with the nursing staff.   Per EDP, his wife reported that pt did not eat breakfast, and  slept most of the day which is unusual for him.  Subsequently he left the house to go to [facility] but could not figure out how to put his truck in reverse.  The wife became worried about him and tried to call him, but he was not picking up.  wife went to find him, and found him in his truck in the parking lot unable to get out of it and is confused. He was unable to walk on his own at that time.    When I saw pt on the floor, he  is confused, and knows his own name, not oriented to place and time.  Patient moves all extremities normally.  No facial droop or slurred speech.  No active respiratory distress, cough, SOB noted.  No active nausea, vomiting, diarrhea noted.  Patient does not seem to have abdominal pain or chest pain.  He denies flank pain.  He also denies symptoms of UTI which is are slightly not reliable due to confusion.  Patient has fever with temperature 103 in ED.      Assessment and Plan:   Sepsis due to urinary tract infection [facility]):  Proteus bacteremia Patient met criteria for severe sepsis with WBC 15.2, temperature 103, heart rate up to 114, RR 22.  Lactic acid 3.1 --> 2.5.   Renal stone CT showed 4 mm obstructive stone within the mid right ureter with secondary mild right hydroureteronephrosis. Few additional nonobstructive right renal calculi measuring up to 7 mm Appreciate urology input, patient is status post ureteral stent placement on 08/31 and will need to follow-up with urology as an outpatient for stent removal.  Patient to call urology office Blood cultures yielded Proteus species.  Patient was on IV Rocephin  and will be discharged on Bactrim  double strength 1 tablet twice daily per ID recommendation Urine culture is sterile        Type II second-degree AV block with intermittent pauses Appreciate cardiology input Recommends continued monitoring and if sinus pauses are greater than 3 seconds  or if patient becomes symptomatic he may require pacemaker implantation Per tele on the day of discharge, patient with 1 degree AVB, rate stable 50s. Overnight with some pauses/2nd degree AVB.  Will be discharged with an event monitor per cardiology Avoid AV nodal blocking agents       Meningioma:  Patient has history of known meningioma and is scheduled for surgical excision on 10/19/2023.   CT scan showed stable meningioma, with stable 5 mm rightward midline shift of the lateral ventricles.  Still has some mass effect and vasogenic edema. Received Decadron  4 mg twice daily.  Patient to follow-up with his neurosurgeon upon discharge Scheduled for resection as an outpatient       Acute metabolic encephalopathy:   Likely due to sepsis from UTI.  Improved Patient is back to his baseline mental status He has history of meningioma, which is stable. Fall precautions Frequent neurocheck     Hypertension Continue amlodipine  10 mg daily     Chronic diastolic CHF (congestive heart failure) (HCC):  2D echo on 07/13/2023 showed EF> 55% with grade 1 diastolic dysfunction.  CHF seem to be compensated. Optimize blood pressure control Hold hydrochlorothiazide and Losartan until patient completes antibiotics.     HLD (hyperlipidemia) Continue Lipitor     Diabetes mellitus without complication [facility]):  Recent A1c 6.2, well-controlled. SSI Maintain consistent carbohydrate diet   Myocardial injury: Trop is minimally elevated 18 --> 18.   Likely demand ischemia. Continue aspirin  and  Lipitor     Mild cognitive impairment Donepezil  is on hold due to second-degree AV block       Obesity (BMI 30-39.9):  Sleep apnea Patient has Obesity Class II, with body weight 116.6  Kg and BMI 35.80 kg/m2.  Encourage losing weight Exercise and healthy diet Continue CPAP at bedtime    Ascending aortic aneurysm Incidental finding. CT scan of the chest without contrast showed stable fusiform dilation of the ascending thoracic aorta measuring 4.0 cm in maximal diameter. Recommend annual imaging followup by CTA or MRA. Findings discussed with patient's wife over the phone.            Consultants: Urology, cardiology Procedures performed: Right ureteral stent placement Disposition: Home health Diet recommendation:  Discharge Diet Orders (From admission, onward)     Start     Ordered   10/18/23 0000  Diet - low sodium heart healthy        10/18/23 1049           Cardiac  diet DISCHARGE MEDICATION: Allergies as of 10/18/2023   No Known Allergies      Medication List     PAUSE taking these medications    hydrochlorothiazide 25 MG tablet Wait to take this until your doctor or other care provider tells you to start again. Commonly known as: HYDRODIURIL hydrochlorothiazide 25 mg tablet   valsartan 80 MG tablet Wait to take this until your doctor or other care provider tells you to start again. Commonly known as: DIOVAN Take 80 mg by mouth daily.       STOP taking these medications    donepezil  10 MG tablet Commonly known as: ARICEPT    fluorouracil  5 % cream Commonly known as: EFUDEX        TAKE these medications    acyclovir 400 MG tablet Commonly known as: ZOVIRAX Take 400 mg by mouth 2 (two) times daily.   amLODipine  10 MG tablet Commonly known as: NORVASC  Take 1 tablet (10 mg total) by mouth daily.  aspirin  EC 81 MG tablet Take 81 mg by mouth daily. Swallow whole.   atorvastatin  40 MG tablet Commonly known as: LIPITOR atorvastatin  40 mg tablet   budesonide 3 MG 24 hr capsule Commonly known as: ENTOCORT EC Take 9 mg by mouth daily.   cetirizine 10 MG tablet Commonly known as: ZYRTEC Take 10 mg by mouth daily. Barkley & Sheryle   ketoconazole  2 % cream Commonly known as: NIZORAL  Apply 1 Application topically at bedtime. Qhs to feet   mupirocin  ointment 2 % Commonly known as: BACTROBAN  Apply 1 Application topically daily.   omeprazole 40 MG capsule Commonly known as: PRILOSEC omeprazole 40 mg capsule,delayed release   sulfamethoxazole -trimethoprim  800-160 MG tablet Commonly known as: Bactrim  DS Take 1 tablet by mouth 2 (two) times daily for 10 days.   tamsulosin  0.4 MG Caps capsule Commonly known as: FLOMAX  Take 1 capsule (0.4 mg total) by mouth daily after supper.        Follow-up Information     [facility]. Call.   Why: Dr. Francisca or Dr. Twylla - [phone]        Alluri,  Keller BROCKS, MD. Go in 1 week(s).   Specialty: Cardiology Contact information: [address] [place] [address] [phone]         Franchot Houston, PA-C Follow up in 1 week(s).   Specialty: Physician Assistant Why: Check BMP in 7 days Contact information: [address] [place] [address] [phone]                Discharge Exam: Filed Weights   10/16/23 0338 10/17/23 0443 10/18/23 0443  Weight: 116.6 kg 119.3 kg 120.3 kg   General: Not in acute distress, obese HEENT: Eyes: PERRL, EOMI, no jaundice ENT: No discharge from the ears and nose. Neck: No JVD, no bruit, no mass felt. Heme: No neck lymph node enlargement. Cardiac: S1/S2, RRR, No murmurs, No gallops or rubs. Respiratory: No rales, wheezing, rhonchi or rubs. GI: Soft, nondistended, nontender, no organomegaly, BS present. GU: No hematuria Ext: No pitting leg edema bilaterally. 1+DP/PT pulse bilaterally. Musculoskeletal: No joint deformities, No joint redness or warmth, no limitation of ROM in spin. Skin: No rashes.  Neuro: Awake, alert and oriented x 3.  Cranial nerves II-XII grossly intact, moves all extremities normally. Psych: Patient is not psychotic, no suicidal or hemocidal ideation.       Condition at discharge: stable  The results of significant diagnostics from this hospitalization (including imaging, microbiology, ancillary and laboratory) are listed below for reference.   Imaging Studies: CT CHEST WO CONTRAST Result Date: 10/17/2023 CLINICAL DATA:  Pneumonia EXAM: CT CHEST WITHOUT CONTRAST TECHNIQUE: Multidetector CT imaging of the chest was performed following the standard protocol without IV contrast. RADIATION DOSE REDUCTION: This exam was performed according to the departmental dose-optimization program which includes automated exposure control, adjustment of the mA and/or kV according to patient size and/or use of iterative reconstruction technique.  COMPARISON:  07/27/2022 FINDINGS: Cardiovascular: Extensive, predominantly left anterior descending, coronary artery calcification. Global cardiac size ismildly enlarged with particular right ventricular enlargement, similar prior examination. No pericardial effusion. Central pulmonary arteries are enlarged in keeping with changes of pulmonary arterial hypertension, stable since prior examination. Mild atherosclerotic calcification within the thoracic aorta. Stable fusiform dilation of the ascending thoracic aorta measuring 4.0 cm in maximal diameter. Mediastinum/Nodes: No enlarged mediastinal or axillary lymph nodes. Thyroid  gland, trachea, and esophagus demonstrate no significant findings. Small hiatal hernia Lungs/Pleura: Multiple subpleural pulmonary  nodules within the visualized lung bases measuring up to 5 mm (99/4) are stable since prior examination and safely considered benign. Mild bandlike atelectasis within the lingula. No focal consolidation. No pneumothorax or pleural effusion. Central airways are widely patent. Upper Abdomen: Multiple cysts are seen scattered throughout the liver. No acute abnormality Musculoskeletal: No acute bone abnormality. No lytic or blastic bone lesion. Osseous structures are age appropriate. IMPRESSION: 1. No acute intrathoracic pathology identified. No focal pulmonary infiltrate. 2. Extensive, predominantly left anterior descending, coronary artery calcification. 3. Mild global cardiac size with particular right ventricular enlargement, similar prior examination. 4. Morphologic changes in keeping with pulmonary arterial hypertension. 5. Stable fusiform dilation of the ascending thoracic aorta measuring 4.0 cm in maximal diameter. Recommend annual imaging followup by CTA or MRA. This recommendation follows 2010 ACCF/AHA/AATS/ACR/ASA/SCA/SCAI/SIR/STS/SVM Guidelines for the Diagnosis and Management of Patients with Thoracic Aortic Disease. Circulation. 2010; 121: Z733-z630.  Aortic aneurysm NOS (ICD10-I71.9) 6. Multiple subpleural pulmonary nodules within the visualized lung bases measuring up to 5 mm, stable since prior examination and safely considered benign. 7. Aortic atherosclerosis. Aortic Atherosclerosis (ICD10-I70.0). Electronically Signed   By: Dorethia Molt M.D.   On: 10/17/2023 19:57   ECHOCARDIOGRAM COMPLETE Result Date: 10/16/2023    ECHOCARDIOGRAM REPORT   Patient Name:   Jason Washington Date of Exam: 10/16/2023 Medical Rec #:  [id number]             Height:       71.0 in Accession #:    [id number]            Weight:       257.1 lb Date of Birth:  1945/08/30             BSA:          2.346 m Patient Age:    78 years              BP:           157/71 mmHg Patient Gender: M                     HR:           66 bpm. Exam Location:  [facility] Procedure: 2D Echo, Cardiac Doppler and Color Doppler (Both Spectral and Color            Flow Doppler were utilized during procedure). Indications:     Atrial Fibrillation I48.91  History:         Patient has no prior history of Echocardiogram examinations.  Sonographer:     Thedora Louder RDCS, FASE Referring Phys:  [id number] Millicent Blazejewski Diagnosing Phys: Denyse Bathe  Sonographer Comments: Technically difficult study due to poor echo windows, patient is obese and suboptimal parasternal window. Image acquisition challenging due to patient body habitus and Image acquisition challenging due to respiratory motion. The patient declined the use of Definity IV ultrasound imaging agent at this time. IMPRESSIONS  1. Left ventricular ejection fraction, by estimation, is 55 to 60%. The left ventricle has normal function. The left ventricle has no regional wall motion abnormalities. Left ventricular diastolic parameters were normal.  2. Right ventricular systolic function is normal. The right ventricular size is normal.  3. The mitral valve is normal in structure. Trivial mitral valve regurgitation. No evidence of mitral stenosis.  4. The  aortic valve is normal in structure. Aortic valve regurgitation is mild. Aortic valve sclerosis/calcification is present, without any evidence of aortic stenosis.  5.  The inferior vena cava is normal in size with greater than 50% respiratory variability, suggesting right atrial pressure of 3 mmHg. FINDINGS  Left Ventricle: Left ventricular ejection fraction, by estimation, is 55 to 60%. The left ventricle has normal function. The left ventricle has no regional wall motion abnormalities. Strain was performed and the global longitudinal strain is indeterminate. The left ventricular internal cavity size was normal in size. There is no left ventricular hypertrophy. Left ventricular diastolic parameters were normal. Right Ventricle: The right ventricular size is normal. No increase in right ventricular wall thickness. Right ventricular systolic function is normal. Left Atrium: Left atrial size was normal in size. Right Atrium: Right atrial size was normal in size. Pericardium: There is no evidence of pericardial effusion. Mitral Valve: The mitral valve is normal in structure. Trivial mitral valve regurgitation. No evidence of mitral valve stenosis. Tricuspid Valve: The tricuspid valve is normal in structure. Tricuspid valve regurgitation is trivial. No evidence of tricuspid stenosis. Aortic Valve: The aortic valve is normal in structure. Aortic valve regurgitation is mild. Aortic regurgitation PHT measures 642 msec. Aortic valve sclerosis/calcification is present, without any evidence of aortic stenosis. Aortic valve peak gradient measures 9.2 mmHg. Pulmonic Valve: The pulmonic valve was normal in structure. Pulmonic valve regurgitation is not visualized. No evidence of pulmonic stenosis. Aorta: The aortic root is normal in size and structure. Venous: The inferior vena cava is normal in size with greater than 50% respiratory variability, suggesting right atrial pressure of 3 mmHg. IAS/Shunts: No atrial level shunt  detected by color flow Doppler. Additional Comments: 3D was performed not requiring image post processing on an independent workstation and was indeterminate.  LEFT VENTRICLE PLAX 2D LVIDd:         5.10 cm   Diastology LVIDs:         3.60 cm   LV e' medial:    8.49 cm/s LV PW:         1.10 cm   LV E/e' medial:  13.3 LV IVS:        1.20 cm   LV e' lateral:   10.00 cm/s LVOT diam:     2.00 cm   LV E/e' lateral: 11.3 LV SV:         107 LV SV Index:   46 LVOT Area:     3.14 cm  RIGHT VENTRICLE RV Basal diam:  3.30 cm RV S prime:     11.00 cm/s TAPSE (M-mode): 1.9 cm LEFT ATRIUM             Index        RIGHT ATRIUM           Index LA diam:        3.40 cm 1.45 cm/m   RA Area:     11.20 cm LA Vol (A2C):   69.1 ml 29.46 ml/m  RA Volume:   23.60 ml  10.06 ml/m LA Vol (A4C):   42.1 ml 17.95 ml/m LA Biplane Vol: 55.6 ml 23.70 ml/m  AORTIC VALVE                 PULMONIC VALVE AV Area (Vmax): 3.02 cm     PV Vmax:        0.77 m/s AV Vmax:        152.00 cm/s  PV Peak grad:   2.4 mmHg AV Peak Grad:   9.2 mmHg     RVOT Peak grad: 3 mmHg LVOT Vmax:  146.00 cm/s LVOT Vmean:     98.300 cm/s LVOT VTI:       0.340 m AI PHT:         642 msec  AORTA Ao Root diam: 3.70 cm MITRAL VALVE                TRICUSPID VALVE MV Area (PHT): 3.06 cm     TR Peak grad:   27.5 mmHg MV Decel Time: 248 msec     TR Vmax:        262.00 cm/s MV E velocity: 113.00 cm/s MV A velocity: 89.40 cm/s   SHUNTS MV E/A ratio:  1.26         Systemic VTI:  0.34 m                             Systemic Diam: 2.00 cm Denyse Bathe Electronically signed by Denyse Bathe Signature Date/Time: 10/16/2023/11:18:57 AM    Final    DG OR UROLOGY CYSTO IMAGE ([facility] ONLY) Result Date: 10/16/2023 There is no interpretation for this exam.  This order is for images obtained during a surgical procedure.  Please See Surgeries Tab for more information regarding the procedure.   CT RENAL STONE STUDY Result Date: 10/15/2023 CLINICAL DATA:  Initial evaluation for acute  abdominal/flank pain. EXAM: CT ABDOMEN AND PELVIS WITHOUT CONTRAST TECHNIQUE: Multidetector CT imaging of the abdomen and pelvis was performed following the standard protocol without IV contrast. RADIATION DOSE REDUCTION: This exam was performed according to the departmental dose-optimization program which includes automated exposure control, adjustment of the mA and/or kV according to patient size and/or use of iterative reconstruction technique. COMPARISON:  None Available. FINDINGS: Lower chest: Few scattered pulmonary nodules noted about the visualized lung bases, largest of which measures 7 mm at the subpleural peripheral left lower lobe (series 4, image 18). Hepatobiliary: Multiple scattered hypodensities noted within the liver, incompletely characterized on this noncontrast examination, but favored to reflect small cysts. Gallbladder within normal limits. No biliary dilatation. Pancreas: Pancreas within normal limits. Previously noted subcentimeter pancreatic cyst not well seen on this noncontrast examination. Spleen: Spleen within normal limits. Adrenals/Urinary Tract: Mild diffuse thickening of the adrenal glands without discrete lesion. Focal calcification at the left adrenal gland noted as well. Left kidney within normal limits without nephrolithiasis or hydronephrosis. No stone seen along the left renal collecting system. No left-sided hydroureter. On the right, there is a 4 mm obstructive stone within the mid right ureter with secondary mild right hydroureteronephrosis. Associated perinephric and periureteral fat stranding. Few additional nonobstructive right renal calculi measuring up to 7 mm noted. Few scattered small renal cysts noted, a few of which demonstrate intermediate density. These were seen and evaluated on prior MRI from 07/24/2022, demonstrated benign at that time. Bladder is largely decompressed no layering stones within the bladder lumen. Stomach/Bowel: Small hiatal hernia noted. Stomach  within normal limits. No evidence for bowel obstruction. No acute inflammatory changes seen about the bowels. Vascular/Lymphatic: Moderate aorto bi-iliac atherosclerotic disease. No enlarged intra-abdominopelvic lymph nodes. Reproductive: Prostate appears to be absent. Other: No free air or fluid. Small bilateral fat containing inguinal hernias without inflammation. Musculoskeletal: No discrete or worrisome osseous lesions. Mild multilevel spondylosis within the visualized thoracolumbar spine. IMPRESSION: 1. 4 mm obstructive stone within the mid right ureter with secondary mild right hydroureteronephrosis. 2. Few additional nonobstructive right renal calculi measuring up to 7 mm 3. Few scattered pulmonary nodules about the visualized  lung bases, largest of which measures 7 mm at the left lower lobe. Per Fleischner Society Guidelines, recommend a non-contrast Chest CT at 3-6 months, then consider another non-contrast Chest CT at 18-24 months. If patient is low risk for malignancy, non-contrast Chest CT at 18-24 months is optional. These guidelines do not apply to immunocompromised patients and patients with cancer. Follow up in patients with significant comorbidities as clinically warranted. For lung cancer screening, adhere to Lung-RADS guidelines. Reference: Radiology. 2017; 284(1):228-43. Aortic Atherosclerosis (ICD10-I70.0). Electronically Signed   By: Morene Hoard M.D.   On: 10/15/2023 03:47   CT Head Wo Contrast Result Date: 10/14/2023 EXAM: CT HEAD WITHOUT CONTRAST 10/14/2023 10:07:34 PM TECHNIQUE: CT of the head was performed without the administration of intravenous contrast. Automated exposure control, iterative reconstruction, and/or weight based adjustment of the mA/kV was utilized to reduce the radiation dose to as low as reasonably achievable. COMPARISON: Head CT and brain MRI 05/24/2023. CLINICAL HISTORY: Mental status change, unknown cause. Alert and confused. History of meningioma. Febrile  with confusion in triage. FINDINGS: BRAIN AND VENTRICLES: Stable appearance of densely calcified mass in the left middle cranial fossa with adjacent mass effect and vasogenic edema. Peripheral cystic component is grossly unchanged in CT appearance. There is 5 mm rightward midline shift of the lateral ventricles, unchanged. Cystic focus in the left aspect of the posterior fossa is unchanged, possible arachnoid cyst. No evidence of acute ischemia, allowing for background chronic change. No acute intracranial hemorrhage. ORBITS: No acute abnormality. SINUSES: Mixed density mucous retention cyst in the left maxillary sinus, unchanged. SOFT TISSUES AND SKULL: No acute soft tissue abnormality. No skull fracture. IMPRESSION: 1. Stable appearance of densely calcified mass in the left middle cranial fossa with adjacent mass effect and vasogenic edema, and 5 mm rightward midline shift of the lateral ventricles. 2. No acute intracranial abnormality. Electronically signed by: Andrea Gasman MD 10/14/2023 10:40 PM EDT RP Workstation: [id number]   DG Chest Port 1 View Result Date: 10/14/2023 EXAM: 1 VIEW XRAY OF THE CHEST 10/14/2023 09:00:16 PM COMPARISON: 05/24/2023 CLINICAL HISTORY: Questionable sepsis - evaluate for abnormality. Febrile with confusion in triage. FINDINGS: LUNGS AND PLEURA: Mild subsegmental atelectasis in the lung bases. No confluent pulmonary opacity. No pulmonary edema. No pleural effusion. No pneumothorax. HEART AND MEDIASTINUM: Cardiomegaly is stable. Unchanged heart size and mediastinal contours. BONES AND SOFT TISSUES: No acute osseous abnormality. IMPRESSION: 1. Mild subsegmental atelectasis in the lung bases. Electronically signed by: Andrea Gasman MD 10/14/2023 09:08 PM EDT RP Workstation: [id number]    Microbiology: Results for orders placed or performed during the hospital encounter of 10/14/23  Blood Culture (routine x 2)     Status: None (Preliminary result)   Collection Time: 10/14/23   8:12 PM   Specimen: BLOOD  Result Value Ref Range Status   Specimen Description BLOOD BLOOD LEFT FOREARM  Final   Special Requests   Final    BOTTLES DRAWN AEROBIC AND ANAEROBIC Blood Culture results may not be optimal due to an inadequate volume of blood received in culture bottles   Culture   Final    NO GROWTH 4 DAYS Performed at [facility], [address] [place], [place] [address]    Report Status PENDING  Incomplete  Resp panel by RT-PCR (RSV, Flu A&B, Covid) Anterior Nasal Swab     Status: None   Collection Time: 10/14/23  8:45 PM   Specimen: Anterior Nasal Swab  Result Value Ref Range Status   SARS Coronavirus 2 by  RT PCR NEGATIVE NEGATIVE Final    Comment: (NOTE) SARS-CoV-2 target nucleic acids are NOT DETECTED.  The SARS-CoV-2 RNA is generally detectable in upper respiratory specimens during the acute phase of infection. The lowest concentration of SARS-CoV-2 viral copies this assay can detect is 138 copies/mL. A negative result does not preclude SARS-Cov-2 infection and should not be used as the sole basis for treatment or other patient management decisions. A negative result may occur with  improper specimen collection/handling, submission of specimen other than nasopharyngeal swab, presence of viral mutation(s) within the areas targeted by this assay, and inadequate number of viral copies(<138 copies/mL). A negative result must be combined with clinical observations, patient history, and epidemiological information. The expected result is Negative.  Fact Sheet for Patients:  BloggerCourse.com  Fact Sheet for Healthcare Providers:  SeriousBroker.it  This test is no t yet approved or cleared by the [place]  FDA and  has been authorized for detection and/or diagnosis of SARS-CoV-2 by FDA under an Emergency Use Authorization (EUA). This EUA will remain  in effect (meaning this test can be used) for  the duration of the COVID-19 declaration under Section 564(b)(1) of the Act, 21 U.S.C.section 360bbb-3(b)(1), unless the authorization is terminated  or revoked sooner.       Influenza A by PCR NEGATIVE NEGATIVE Final   Influenza B by PCR NEGATIVE NEGATIVE Final    Comment: (NOTE) The Xpert Xpress SARS-CoV-2/FLU/RSV plus assay is intended as an aid in the diagnosis of influenza from Nasopharyngeal swab specimens and should not be used as a sole basis for treatment. Nasal washings and aspirates are unacceptable for Xpert Xpress SARS-CoV-2/FLU/RSV testing.  Fact Sheet for Patients: BloggerCourse.com  Fact Sheet for Healthcare Providers: SeriousBroker.it  This test is not yet approved or cleared by the [place]  FDA and has been authorized for detection and/or diagnosis of SARS-CoV-2 by FDA under an Emergency Use Authorization (EUA). This EUA will remain in effect (meaning this test can be used) for the duration of the COVID-19 declaration under Section 564(b)(1) of the Act, 21 U.S.C. section 360bbb-3(b)(1), unless the authorization is terminated or revoked.     Resp Syncytial Virus by PCR NEGATIVE NEGATIVE Final    Comment: (NOTE) Fact Sheet for Patients: BloggerCourse.com  Fact Sheet for Healthcare Providers: SeriousBroker.it  This test is not yet approved or cleared by the [place]  FDA and has been authorized for detection and/or diagnosis of SARS-CoV-2 by FDA under an Emergency Use Authorization (EUA). This EUA will remain in effect (meaning this test can be used) for the duration of the COVID-19 declaration under Section 564(b)(1) of the Act, 21 U.S.C. section 360bbb-3(b)(1), unless the authorization is terminated or revoked.  Performed at [facility], [address] [place], [place] [address]   Blood Culture (routine x 2)     Status: Abnormal    Collection Time: 10/14/23  8:55 PM   Specimen: BLOOD  Result Value Ref Range Status   Specimen Description   Final    BLOOD BLOOD LEFT ARM Performed at [facility], [address] [place], [place] [address]    Special Requests   Final    BOTTLES DRAWN AEROBIC AND ANAEROBIC Blood Culture adequate volume Performed at [facility], [address] [place], [place] [address]    Culture  Setup Time   Final    GRAM NEGATIVE RODS AEROBIC BOTTLE ONLY CRITICAL RESULT CALLED TO, READ BACK BY AND VERIFIED WITH: IDOLINA PERCY, PHARMD @0211  10/16/2023 COP Performed  at [facility], [address], [place], [place] [address]    Culture PROTEUS MIRABILIS (A)  Final   Report Status 10/18/2023 FINAL  Final   Organism ID, Bacteria PROTEUS MIRABILIS  Final      Susceptibility   Proteus mirabilis - MIC*    AMPICILLIN <=2 SENSITIVE Sensitive     CEFAZOLIN (NON-URINE) 4 INTERMEDIATE Intermediate     CEFEPIME  <=0.12 SENSITIVE Sensitive     ERTAPENEM <=0.12 SENSITIVE Sensitive     CEFTRIAXONE  <=0.25 SENSITIVE Sensitive     CIPROFLOXACIN <=0.06 SENSITIVE Sensitive     GENTAMICIN <=1 SENSITIVE Sensitive     MEROPENEM <=0.25 SENSITIVE Sensitive     TRIMETH /SULFA  <=20 SENSITIVE Sensitive     AMPICILLIN/SULBACTAM <=2 SENSITIVE Sensitive     PIP/TAZO Value in next row Sensitive ug/mL     <=4 SENSITIVEThis is a modified FDA-approved test that has been validated and its performance characteristics determined by the reporting laboratory.  This laboratory is certified under the Clinical Laboratory Improvement Amendments CLIA as qualified to perform high complexity clinical laboratory testing.    * PROTEUS MIRABILIS  Blood Culture ID Panel (Reflexed)     Status: Abnormal   Collection Time: 10/14/23  8:55 PM  Result Value Ref Range Status   Enterococcus faecalis NOT DETECTED NOT DETECTED Final   Enterococcus Faecium NOT DETECTED NOT DETECTED Final   Listeria monocytogenes NOT DETECTED  NOT DETECTED Final   Staphylococcus species NOT DETECTED NOT DETECTED Final   Staphylococcus aureus (BCID) NOT DETECTED NOT DETECTED Final   Staphylococcus epidermidis NOT DETECTED NOT DETECTED Final   Staphylococcus lugdunensis NOT DETECTED NOT DETECTED Final   Streptococcus species NOT DETECTED NOT DETECTED Final   Streptococcus agalactiae NOT DETECTED NOT DETECTED Final   Streptococcus pneumoniae NOT DETECTED NOT DETECTED Final   Streptococcus pyogenes NOT DETECTED NOT DETECTED Final   A.calcoaceticus-baumannii NOT DETECTED NOT DETECTED Final   Bacteroides fragilis NOT DETECTED NOT DETECTED Final   Enterobacterales DETECTED (A) NOT DETECTED Final    Comment: Enterobacterales represent a large order of gram negative bacteria, not a single organism. CRITICAL RESULT CALLED TO, READ BACK BY AND VERIFIED WITH: IDOLINA PERCY, PHARMD @0211  10/16/2023 COP    Enterobacter cloacae complex NOT DETECTED NOT DETECTED Final   Escherichia coli NOT DETECTED NOT DETECTED Final   Klebsiella aerogenes NOT DETECTED NOT DETECTED Final   Klebsiella oxytoca NOT DETECTED NOT DETECTED Final   Klebsiella pneumoniae NOT DETECTED NOT DETECTED Final   Proteus species DETECTED (A) NOT DETECTED Final    Comment: CRITICAL RESULT CALLED TO, READ BACK BY AND VERIFIED WITH: IDOLINA PERCY, PHARMD @0211  10/16/2023 COP    Salmonella species NOT DETECTED NOT DETECTED Final   Serratia marcescens NOT DETECTED NOT DETECTED Final   Haemophilus influenzae NOT DETECTED NOT DETECTED Final   Neisseria meningitidis NOT DETECTED NOT DETECTED Final   Pseudomonas aeruginosa NOT DETECTED NOT DETECTED Final   Stenotrophomonas maltophilia NOT DETECTED NOT DETECTED Final   Candida albicans NOT DETECTED NOT DETECTED Final   Candida auris NOT DETECTED NOT DETECTED Final   Candida glabrata NOT DETECTED NOT DETECTED Final   Candida krusei NOT DETECTED NOT DETECTED Final   Candida parapsilosis NOT DETECTED NOT DETECTED Final   Candida  tropicalis NOT DETECTED NOT DETECTED Final   Cryptococcus neoformans/gattii NOT DETECTED NOT DETECTED Final   CTX-M ESBL NOT DETECTED NOT DETECTED Final   Carbapenem resistance IMP NOT DETECTED NOT DETECTED Final   Carbapenem resistance KPC NOT DETECTED NOT DETECTED  Final   Carbapenem resistance NDM NOT DETECTED NOT DETECTED Final   Carbapenem resist OXA 48 LIKE NOT DETECTED NOT DETECTED Final   Carbapenem resistance VIM NOT DETECTED NOT DETECTED Final    Comment: Performed at [facility], [address], [place], [place] [address]  Urine Culture     Status: None   Collection Time: 10/14/23 10:36 PM   Specimen: Urine, Random  Result Value Ref Range Status   Specimen Description   Final    URINE, RANDOM Performed at [facility], [address], [place], [place] [address]    Special Requests   Final    NONE Reflexed from [id number] Performed at [facility], [address], [place], [place] [address]    Culture   Final    NO GROWTH Performed at [facility], [address] [place], [place] [address]    Report Status 10/16/2023 FINAL  Final    Labs: CBC: Recent Labs  Lab 10/14/23 2012 10/15/23 0627 10/16/23 0538 10/17/23 1024 10/18/23 0911  WBC 15.2* 15.3* 12.2* 11.6* 10.0  NEUTROABS 12.0*  --  9.8* 9.5* 7.3  HGB 14.8 13.2 12.5* 12.3* 12.5*  HCT 44.1 38.3* 36.6* 36.9* 36.9*  MCV 98.9 97.2 98.7 98.7 97.9  PLT 276 217 209 239 258   Basic Metabolic Panel: Recent Labs  Lab 10/14/23 2012 10/15/23 0627 10/16/23 0538 10/17/23 1024 10/18/23 0911  NA 137 136 138 137 140  K 3.8 3.6 3.6 3.7 3.7  CL 100 101 104 103 102  CO2 23 25 27 22 24   GLUCOSE 132* 171* 149* 170* 148*  BUN 25* 16 19 20 21   CREATININE 0.92 0.80 0.72 0.60* 0.78  CALCIUM  9.9 9.0 9.0 8.5* 8.9  MG  --   --  1.9  --   --    Liver Function Tests: Recent Labs  Lab 10/14/23 2012  AST 26  ALT 29  ALKPHOS 55  BILITOT 1.0  PROT 8.2*  ALBUMIN 4.2   CBG: Recent Labs   Lab 10/17/23 0758 10/17/23 1222 10/17/23 1644 10/17/23 2107 10/18/23 0756  GLUCAP 126* 126* 112* 137* 115*    Discharge time spent: greater than 30 minutes.  Signed: Aimee Somerset, MD [facility] 10/18/2023

## 2023-10-19 ENCOUNTER — Telehealth: Payer: Self-pay

## 2023-10-19 LAB — CULTURE, BLOOD (ROUTINE X 2): Culture: NO GROWTH

## 2023-10-19 NOTE — Progress Notes (Signed)
   [facility] Surgical Posting Form  Surgery Date: Date: 11/07/2023  Surgeon: Dr. Redell Burnet, MD  Inpt ( No  )   Outpt (Yes)   Obs ( No  )   Diagnosis: N20.1 Right Ureteral Stone  -CPT: [phone]  Surgery: Right Ureteroscopy with Laser Lithotripsy and Stent Exchange  Stop Anticoagulations: No, may continue all   Cardiac/Medical/Pulmonary Clearance needed: no  *Orders entered into EPIC  Date: 10/19/23   *Case booked in [place]  Date: 10/19/23  *Notified pt of Surgery: Date: 10/19/23  PRE-OP UA & CX: no  *Placed into Prior Authorization Work [place] Date: 10/19/23  Assistant/laser/rep:No

## 2023-10-19 NOTE — Telephone Encounter (Signed)
 Per Dr. Francisca, Patient is to be scheduled for  Right Ureteroscopy with Laser Lithotripsy and Stent Exchange  [name] and wife were contacted and possible surgical dates were discussed, Monday September 22nd, 2025 was agreed upon for surgery.   Patient was directed to call [phone] between 1-3pm the day before surgery to find out surgical arrival time.  Instructions were given not to eat or drink from midnight on the night before surgery and have a driver for the day of surgery. On the surgery day patient was instructed to enter through the Medical Mall entrance of [facility] report the Same Day Surgery desk.   Pre-Admit Testing will be in contact via phone to set up an interview with the anesthesia team to review your history and medications prior to surgery.   Reminder of this information was sent via MyChart to the patient.

## 2023-10-21 DIAGNOSIS — A419 Sepsis, unspecified organism: Secondary | ICD-10-CM | POA: Diagnosis not present

## 2023-10-21 DIAGNOSIS — E669 Obesity, unspecified: Secondary | ICD-10-CM | POA: Diagnosis not present

## 2023-10-21 DIAGNOSIS — E119 Type 2 diabetes mellitus without complications: Secondary | ICD-10-CM | POA: Diagnosis not present

## 2023-10-21 DIAGNOSIS — I110 Hypertensive heart disease with heart failure: Secondary | ICD-10-CM | POA: Diagnosis not present

## 2023-10-21 DIAGNOSIS — I5032 Chronic diastolic (congestive) heart failure: Secondary | ICD-10-CM | POA: Diagnosis not present

## 2023-10-21 DIAGNOSIS — G4730 Sleep apnea, unspecified: Secondary | ICD-10-CM | POA: Diagnosis not present

## 2023-10-21 DIAGNOSIS — I714 Abdominal aortic aneurysm, without rupture, unspecified: Secondary | ICD-10-CM | POA: Diagnosis not present

## 2023-10-21 DIAGNOSIS — N390 Urinary tract infection, site not specified: Secondary | ICD-10-CM | POA: Diagnosis not present

## 2023-10-21 DIAGNOSIS — F03918 Unspecified dementia, unspecified severity, with other behavioral disturbance: Secondary | ICD-10-CM | POA: Diagnosis not present

## 2023-10-21 DIAGNOSIS — D329 Benign neoplasm of meninges, unspecified: Secondary | ICD-10-CM | POA: Diagnosis not present

## 2023-10-27 ENCOUNTER — Telehealth: Payer: Self-pay

## 2023-10-27 DIAGNOSIS — N201 Calculus of ureter: Secondary | ICD-10-CM

## 2023-10-27 MED ORDER — OXYBUTYNIN CHLORIDE ER 5 MG PO TB24: 5.0000 mg | ORAL_TABLET | Freq: Every day | ORAL | 0 refills | Status: AC

## 2023-10-27 NOTE — Telephone Encounter (Signed)
 Patient called in and states that he is having urinary frequency, urgency and decreased output, thinks it may be related to stent. Currently on Flomax  for this. Denies body aches and fever. Wanted to know if he could have something sent in to help or possibly move his surgery up to earlier date. Also currently on Bactrim .

## 2023-10-27 NOTE — Telephone Encounter (Signed)
 Spoke with pt. Pt. Advised that medication sent in to pharmacy per patient request.

## 2023-10-31 ENCOUNTER — Encounter
Admission: RE | Admit: 2023-10-31 | Discharge: 2023-10-31 | Disposition: A | Discharge status: Home / Self Care | Source: Ambulatory Visit | Attending: Urology | Admitting: Urology

## 2023-10-31 ENCOUNTER — Other Ambulatory Visit: Payer: Self-pay

## 2023-10-31 VITALS — Ht 71.0 in | Wt 258.0 lb

## 2023-10-31 DIAGNOSIS — E782 Mixed hyperlipidemia: Secondary | ICD-10-CM | POA: Diagnosis not present

## 2023-10-31 DIAGNOSIS — E119 Type 2 diabetes mellitus without complications: Secondary | ICD-10-CM

## 2023-10-31 DIAGNOSIS — I10 Essential (primary) hypertension: Secondary | ICD-10-CM | POA: Diagnosis not present

## 2023-10-31 DIAGNOSIS — I2510 Atherosclerotic heart disease of native coronary artery without angina pectoris: Secondary | ICD-10-CM | POA: Diagnosis not present

## 2023-10-31 DIAGNOSIS — I440 Atrioventricular block, first degree: Secondary | ICD-10-CM | POA: Diagnosis not present

## 2023-10-31 HISTORY — DX: Personal history of other diseases of the circulatory system: Z86.79

## 2023-10-31 HISTORY — DX: Heart failure, unspecified: I50.9

## 2023-10-31 NOTE — Progress Notes (Signed)
 Established patient Visit    Chief Complaint: AV block Chief Complaint  Patient presents with  . Follow-up    Follow up from [facility]    Date of Service: 10/31/2023 Date of Birth: [birth year] PCP: Franchot Edsel Caldron, PA  History of Present Illness: Jason Washington is a 78 y.o.male patient who presented to our clinic for follow-up of AV block noted during recent hospital admission.  Past medical history significant for CAD as noted by coronary calcification CT chest 2024, hypertension, hyperlipidemia, tobacco abuse-cut down but continues to smoke, OSA, meningioma-undergoing evaluation for surgery.  Echocardiogram 06/2023 with normal biventricular systolic function, moderate LVH, no significant valvular dysfunction.  Stress test 06/2023 normal with no ischemia.  Patient was in hospital end of 09/2023 with confusion/altered mental status in setting of UTI with sepsis.  In the setting patient noted to be in sinus rhythm with first-degree AV block with short pauses.  No high-grade AV block or significant pauses.  Currently wearing cardiac monitor  Today patient is accompanied by Jason Washington to visit.  Has some memory issues.  No complaints of chest pain/pressure.  Has stable exertional dyspnea.  No palpitation, dizziness or syncope.  Past Medical and Surgical History  Past Medical History Past Medical History:  Diagnosis Date  . Basal cell carcinoma   . Cancer (CMS/HHS-HCC)    Prostate  . Diabetes mellitus without complication (CMS/HHS-HCC)   . Dry eyes   . Genital herpes   . GERD (gastroesophageal reflux disease)   . Hyperlipidemia   . Hypertension   . Lymphocytic colitis   . Meningioma (CMS/HHS-HCC)   . Prostate cancer (CMS/HHS-HCC)   . Sleep apnea    can't tolerate CPAP    Past Surgical History He has a past surgical history that includes Appendectomy; Tonsillectomy; Hernia repair; Prostate surgery; melanoma excision (Right); melanocytic lesion with atypia; ruptured disk; Colon @ [facility]  (10/07/2021); Cataract extraction (Right, 08/17/2022); Cataract extraction (Left, 08/03/2022); Eye surgery; Back surgery; and Colonoscopy.   Medications and Allergies  Current Medications Current Outpatient Medications  Medication Sig Dispense Refill  . amLODIPine  (NORVASC ) 10 MG tablet Take 1 tablet (10 mg total) by mouth once daily Needs an annual exam for further refills. (Patient taking differently: Take 10 mg by mouth every morning Needs an annual exam for further refills.) 90 tablet 3  . aspirin  81 MG EC tablet Take 81 mg by mouth every morning    . budesonide (ENTOCORT EC) 3 mg EC capsule TAKE 3 CAPS BY MOUTH DAILY X 4 WKS,THEN 2 CAPS DAILY X 2 WKS,THEN 1CAP BY MOUTH DAILY X 2 WKS (Patient taking differently: Take 3 mg by mouth every morning) 126 capsule 5  . diphenhydramine HCl (UNISOM, DIPHENHYDRAMINE, ORAL) Take 1 tablet by mouth at bedtime    . hydroCHLOROthiazide (HYDRODIURIL) 25 MG tablet TAKE 1 TABLET BY MOUTH EVERY DAY (Patient taking differently: Take 25 mg by mouth every morning) 90 tablet 3  . loratadine (CLARITIN) 10 mg tablet Take 10 mg by mouth as needed for Allergies    . omeprazole (PRILOSEC) 40 MG DR capsule TAKE 1 CAPSULE BY MOUTH EVERY DAY (Patient taking differently: Take 40 mg by mouth every morning) 90 capsule 3  . valsartan (DIOVAN) 320 MG tablet Take 1 tablet (320 mg total) by mouth once daily (Patient taking differently: Take 320 mg by mouth every morning) 90 tablet 3  . atorvastatin  (LIPITOR) 40 MG tablet TAKE 1 TABLET BY MOUTH EVERY DAY (Patient not taking: Reported on 10/31/2023) 90 tablet 3  No current facility-administered medications for this visit.    Allergies Morphine  Social and Family History  Social History  reports that he has quit smoking. Jason smoking use included cigarettes. He started smoking about 38 years ago. He has a 7.6 pack-year smoking history. He has never used smokeless tobacco. He reports that he does not drink alcohol and does not use  drugs.  Family History family history includes Heart disease in Jason mother; High blood pressure (Hypertension) in Jason father and mother; Melanoma in Jason daughter.   Review of Systems   Review of Systems:  Stable exertional dyspnea  Physical Examination   Vitals:BP 124/80   Pulse 85   Ht 180.3 cm (5' 11)   Wt (!) 115.2 kg (254 lb)   SpO2 97%   BMI 35.43 kg/m  Ht:180.3 cm (5' 11) Wt:(!) 115.2 kg (254 lb) ADJ:Anib surface area is 2.4 meters squared. Body mass index is 35.43 kg/m.  HEENT: Pupils equally reactive to light and accomodation  Neck: Supple, no significant JVD Lungs: clear to auscultation bilaterally; no wheezes, rales, rhonchi Heart: Regular rate and rhythm. No murmur Extremities: no pedal edema  Assessment and Plan   78 y.o. male with  Coronary artery disease as noted by coronary calcification CT chest 2024 Stress test 06/2023 with no ischemia AV block Hypertension Hyperlipidemia Tobacco abuse Obesity Meningioma  Stable from cardiac standpoint with no complaints.  No dizziness or syncope. Euvolemic on exam. Recently in hospital for UTI/sepsis.  In this setting noted to have first-degree and possibly Mobitz 1 second-degree AV block with short pauses.  No high-grade AV block or significant bradycardia.  He is currently wearing cardiac monitor.  Will follow-up on cardiac monitor, if no significant bradycardia or high-grade AV block noted-no further management would be indicated.   Avoid AV nodal blocking agent Blood pressure well-controlled, continue current antihypertensive. Continue low-dose aspirin . Patient Washington/patient do not want to be on statin with concern for possible effect on memory  No orders of the defined types were placed in this encounter.   Return in about 6 months (around 04/29/2024).  KRISHNA CHAITANYA ALLURI, MD  This dictation was prepared with dragon dictation. Any transcription errors that result from this process are  unintentional.

## 2023-10-31 NOTE — Patient Instructions (Addendum)
 Your procedure is scheduled on: Monday 11/07/23 Report to the Registration Desk on the 1st floor of the Medical Mall. To find out your arrival time, please call [phone] between 1PM - 3PM on: Friday 11/04/23 If your arrival time is 6:00 am, do not arrive before that time as the Medical Mall entrance doors do not open until 6:00 am.  REMEMBER: Instructions that are not followed completely may result in serious medical risk, up to and including death; or upon the discretion of your surgeon and anesthesiologist your surgery may need to be rescheduled.  Do not eat food or drink any liquids after midnight the night before surgery.  No gum chewing or hard candies.  One week prior to surgery: Stop Anti-inflammatories (NSAIDS) such as Advil, Aleve, Ibuprofen, Motrin, Naproxen, Naprosyn and Aspirin  based products such as Excedrin, Goody's Powder, BC Powder.  You may however, continue to take Tylenol  if needed for pain up until the day of surgery.  Stop ANY OVER THE COUNTER supplements and vitamins until after surgery.   **Follow recommendations regarding stopping blood thinners.** You may continue taking your Aspirin  thru Sunday 11/06/23. Do not take the day of your procedure Monday 11/07/23  Continue taking all of your other prescription medications up until the day of surgery.  ON THE DAY OF SURGERY ONLY TAKE THESE MEDICATIONS WITH SIPS OF WATER:  amLODipine  (NORVASC ) 10 MG tablet  budesonide (ENTOCORT EC) 3 MG 24 hr capsule  omeprazole (PRILOSEC) 40 MG capsule  oxybutynin  (DITROPAN -XL) 5 MG 24 hr tablet   No Alcohol for 24 hours before or after surgery.  No Smoking including e-cigarettes for 24 hours before surgery.  No chewable tobacco products for at least 6 hours before surgery.  No nicotine  patches on the day of surgery.  Do not use any recreational drugs for at least a week (preferably 2 weeks) before your surgery.  Please be advised that the combination of cocaine and  anesthesia may have negative outcomes, up to and including death. If you test positive for cocaine, your surgery will be cancelled.  On the morning of surgery brush your teeth with toothpaste and water, you may rinse your mouth with mouthwash if you wish. Do not swallow any toothpaste or mouthwash.  Shower/Bathe prior to arrival for your procedure  Do not shave body hair from the neck down 48 hours before surgery.  Do not wear lotions, powders, or perfumes/cologne  Wear comfortable clothing (specific to your surgery type) to the hospital.  Do not wear jewelry, make-up, hairpins, clips or nail polish.  For welded (permanent) jewelry: bracelets, anklets, waist bands, etc.  Please have this removed prior to surgery.  If it is not removed, there is a chance that hospital personnel will need to cut it off on the day of surgery.  Contact lenses, hearing aids and dentures may not be worn into surgery.  Do not bring valuables to the hospital. [facility] is not responsible for any missing/lost belongings or valuables.   Notify your doctor if there is any change in your medical condition (cold, fever, infection).  After surgery, you can help prevent lung complications by doing breathing exercises.  Take deep breaths and cough every 1-2 hours. Your doctor may order a device called an Incentive Spirometer to help you take deep breaths.  If you are being discharged the day of surgery, you will not be allowed to drive home. You will need a responsible individual to drive you home and stay with you for 24  hours after surgery.   Please call the Pre-admissions Testing Dept. at [phone] if you have any questions about these instructions.  Surgery Visitation Policy:  Patients having surgery or a procedure may have two visitors.  Children under the age of 59 must have an adult with them who is not the patient.   Merchandiser, retail to address health-related social needs:   [email or web address]

## 2023-11-03 ENCOUNTER — Encounter: Payer: Self-pay | Admitting: Urology

## 2023-11-03 DIAGNOSIS — R413 Other amnesia: Secondary | ICD-10-CM | POA: Diagnosis not present

## 2023-11-03 DIAGNOSIS — R4182 Altered mental status, unspecified: Secondary | ICD-10-CM | POA: Diagnosis not present

## 2023-11-03 DIAGNOSIS — A419 Sepsis, unspecified organism: Secondary | ICD-10-CM | POA: Diagnosis not present

## 2023-11-03 DIAGNOSIS — Z960 Presence of urogenital implants: Secondary | ICD-10-CM | POA: Diagnosis not present

## 2023-11-03 DIAGNOSIS — E119 Type 2 diabetes mellitus without complications: Secondary | ICD-10-CM | POA: Diagnosis not present

## 2023-11-03 DIAGNOSIS — E782 Mixed hyperlipidemia: Secondary | ICD-10-CM | POA: Diagnosis not present

## 2023-11-03 DIAGNOSIS — N1330 Unspecified hydronephrosis: Secondary | ICD-10-CM | POA: Diagnosis not present

## 2023-11-03 DIAGNOSIS — D329 Benign neoplasm of meninges, unspecified: Secondary | ICD-10-CM | POA: Diagnosis not present

## 2023-11-03 DIAGNOSIS — I440 Atrioventricular block, first degree: Secondary | ICD-10-CM | POA: Diagnosis not present

## 2023-11-03 DIAGNOSIS — I10 Essential (primary) hypertension: Secondary | ICD-10-CM | POA: Diagnosis not present

## 2023-11-03 DIAGNOSIS — N200 Calculus of kidney: Secondary | ICD-10-CM | POA: Diagnosis not present

## 2023-11-03 DIAGNOSIS — R7881 Bacteremia: Secondary | ICD-10-CM | POA: Diagnosis not present

## 2023-11-03 NOTE — Progress Notes (Signed)
 Perioperative / Anesthesia Services  Pre-Admission Testing Clinical Review / Pre-Operative Anesthesia Consult  Date: 11/03/23  PATIENT DEMOGRAPHICS: Name: Jason Washington DOB: 07-17-1945 MRN:   [id number]  Note: Available PAT nursing documentation and vital signs have been reviewed. Clinical nursing staff has updated patient's PMH/PSHx, current medication list, and drug allergies/intolerances to ensure complete and comprehensive history available to assist care teams in MDM as it pertains to the aforementioned surgical procedure and anticipated anesthetic course. Extensive review of available clinical information personally performed. Home PMH and PSHx updated with any diagnoses/procedures that  may have been inadvertently omitted during his intake with the pre-admission testing department's nursing staff.  PLANNED SURGICAL PROCEDURE(S):   Case: [id number] Date/Time: 11/07/23 1328   Procedure: CYSTOSCOPY/URETEROSCOPY/HOLMIUM LASER/STENT PLACEMENT (Right) - EXCHANGE   Anesthesia type: General   Diagnosis: Right ureteral stone [N20.1]   Pre-op diagnosis: Right Ureteral Stone   Location: [facility] OR ROOM 10 / [facility]   Surgeons: Francisca Redell BROCKS, MD        CLINICAL DISCUSSION: Jason Washington is a 78 y.o. male who is submitted for pre-surgical anesthesia review and clearance prior to him undergoing the above procedure. Patient is a Current Smoker (10 pack years). Pertinent PMH includes: CAD, HFpEF, PAF, TAA, bradycardia, first-degree AV block, Mobitz 1 second-degree AV block with short pauses, RBBB, HTN, HLD, T2DM, OSAH (unable to tolerate nocturnal PAP therapy), GERD (on daily PPI), meningioma, prostate cancer, nephrolithiasis, imbalance/fall risk, cervical spinal stenosis, lumbar DDD, mild cognitive impairment.  Patient is followed by cardiology [name], MD). He was last seen in the cardiology clinic on 10/31/2023; notes reviewed. At the time of his  clinic visit, patient doing well overall from a cardiovascular perspective.  Patient with chronic exertional dyspnea that was reported to be stable and at baseline.  Patient recently admitted for AMS in the setting of urosepsis.  He continued to have difficulties with his memory.  Patient denied any chest pain, PND, orthopnea, palpitations, significant peripheral edema, weakness, fatigue, vertiginous symptoms, or presyncope/syncope. Patient with a past medical history significant for cardiovascular diagnoses. Documented physical exam was grossly benign, providing no evidence of acute exacerbation and/or decompensation of the patient's known cardiovascular conditions.  Most recent myocardial perfusion imaging study was performed on 07/15/2023 revealing a normal left ventricular systolic function with an EF of 61%.  There were no regional wall motion abnormalities.  No artifact or left ventricular cavity size enlargement appreciated on review of imaging.  CT attenuation correction images showed  mild coronary calcifications.  SPECT images demonstrated no evidence of stress-induced myocardial ischemia or arrhythmia; no scintigraphic evidence of scar.  TID ratio = 0.95 (normal range </= 1.2). Study determined to be normal and low risk.  Most recent TTE performed on 10/16/2023 revealed a normal left ventricular systolic function with an EF of 55-60%. There were no regional wall motion abnormalities.  Left ventricular diastolic Doppler parameters were normal.  Right ventricular size and function normal with a TAPSE measuring 1.9 cm  (normal range >/= 1.6 cm).  Aortic valve mildly sclerotic.  There was trivial mitral, trivial tricuspid, and mild aortic valve regurgitation.  All transvalvular gradients were noted to be normal providing no evidence of hemodynamically significant valvular stenosis. Aorta normal in size with no evidence of ectasia or aneurysmal dilatation.  Patient with known for some form dilatation of  the ascending thoracic aorta.  Most recent imaging was performed on 10/17/2023 (CT chest), at which time aneurysmal defect measured  up to 4.0 cm in maximal diameter.  Patient with a paroxysmal trial fibrillation diagnosis; CHA2DS2-VASc Score = 6 (age x 2, HFpEF, HTN, vascular disease, T2DM). His rate and rhythm are currently being maintained intrinsically without the need for pharmacological intervention.  Patient is not on any type of chronic oral anticoagulation therapy. Blood pressure well controlled at 124/80 mmHg on currently prescribed CCB (amlodipine ), diuretic (HCTZ), and ARB (valsartan) therapies.  Patient had been prescribed atorvastatin  for his HLD diagnosis and further ASCVD prevention.  With that said, patient and wife did not want patient to continue this medication due to the potential effects that it has on his memory.  Patient already with mild cognitive changes.  Risk versus benefits were discussed with patient by cardiology.  T2DM well-controlled on currently prescribed regimen; last HgbA1c was 6.2% when checked on 10/13/2023.  Patient does have an OSAH diagnosis, however he is unable to tolerate the mask required for nocturnal PAP therapy.  Functional capacity is somewhat limited by patient's age and multiple medical comorbidities.  He was a bit deconditioned when he was seen in the cardiology office due to recent urosepsis.  With that said, patient able to complete all of his ADLs/IADLs without significant cardiovascular limitation.  At baseline, patient felt to be able to achieve at least 4 METS of physical activity without experiencing any significant degrees of angina/anginal equivalent symptoms.  No changes were made to his medication regimen.  Patient to follow-up with outpatient cardiology in 6 months or sooner if needed.  Jason Washington is scheduled for an elective CYSTOSCOPY/URETEROSCOPY/HOLMIUM LASER/STENT PLACEMENT (Right) on 11/07/2023 with Dr. Redell [name], MD. Given  patient's past medical history significant for cardiovascular diagnoses, presurgical cardiac clearance was sought by the PAT team. Per cardiology, this patient is optimized for surgery and may proceed with the planned procedural course with a LOW risk of significant perioperative cardiovascular complications.  In review of the patient's chart, it is noted that he is on daily oral antithrombotic therapy. Given that patient's past medical history is significant for cardiovascular diagnoses, including but not limited to CAD, urology has cleared patient to continue his daily low dose ASA throughout his perioperative course.  Patient has been updated on these directives from his specialty care providers by the PAT team.  Patient denies previous perioperative complications with anesthesia in the past. In review his EMR, it is noted that patient underwent a general anesthetic course here at [facility] (ASA III) in 09/2023 without documented complications.   MOST RECENT VITAL SIGNS:    10/31/2023   10:25 AM 10/18/2023    7:53 AM 10/18/2023    4:43 AM  Vitals with BMI  Height 5' 11    Weight 258 lbs  265 lbs 3 oz  BMI 36  37.01  Systolic  159   Diastolic  84   Pulse  54    PROVIDERS/SPECIALISTS: NOTE: Primary physician provider listed below. Patient may have been seen by APP or partner within same practice.   PROVIDER ROLE / SPECIALTY LAST OV  Burnet Redell JAYSON, MD Urology (Surgeon) Seen during inpatient admission 10/18/2023  Franchot Houston, PA-C Primary Care Provider 08/10/2023  Wilburn Fillers, MD Cardiology 10/31/2023  Fayette Bodily, MD  Infectious Disease Seen during inpatient admission 10/18/2023   ALLERGIES: No Known Allergies  CURRENT HOME MEDICATIONS: No current facility-administered medications for this encounter.    acyclovir (ZOVIRAX) 400 MG tablet   amLODipine  (NORVASC ) 10 MG tablet   aspirin   EC 81 MG tablet   atorvastatin  (LIPITOR)  40 MG tablet   budesonide (ENTOCORT EC) 3 MG 24 hr capsule   cetirizine (ZYRTEC) 10 MG tablet   [Paused] hydrochlorothiazide (HYDRODIURIL) 25 MG tablet   omeprazole (PRILOSEC) 40 MG capsule   oxybutynin  (DITROPAN -XL) 5 MG 24 hr tablet   tamsulosin  (FLOMAX ) 0.4 MG CAPS capsule   [Paused] valsartan (DIOVAN) 80 MG tablet   HISTORY: Past Medical History:  Diagnosis Date   (HFpEF) heart failure with preserved ejection fraction (HCC)    Actinic keratosis    Aortic atherosclerosis (HCC)    Basal cell carcinoma 11/21/2019   right superior lat pectoral/ EDC   Basal cell carcinoma 08/12/2022   Right sideburn. Mohs pending.   Bradycardia    Chronic GERD    Dry eyes    Genital herpes    Hepatic steatosis    HLD (hyperlipidemia)    Hypertension    Imbalance    Long-term use of aspirin  therapy    Lymphocytic colitis    Melanoma (HCC)    R knee several years ago    Meningioma (HCC)    Mild cognitive impairment with memory loss    Nephrolithiasis    Neuropathy    PAF (paroxysmal atrial fibrillation) (HCC)    a.) CHA2DS2VASc = 6 (age x2,HFpEF, HTN, vascular disease, T2DM) as of 11/03/2023; b.) rate/rhythm maintained) daily without the need for pharmacological intervention; no chronic OAC   Prostate CA (HCC)    Sleep apnea    a.) unable to tolerate nocturnal PAP therapy   Squamous cell carcinoma of skin 12/22/2021   left forearm-anterior treated wiht EDC   T2DM (type 2 diabetes mellitus) (HCC)    Thoracic aortic aneurysm (HCC)    Tobacco use    Past Surgical History:  Procedure Laterality Date   APPENDECTOMY     CATARACT EXTRACTION W/PHACO Left 08/03/2022   Procedure: CATARACT EXTRACTION PHACO AND INTRAOCULAR LENS PLACEMENT (IOC) LEFT CLAREON PANOPTIX TORIC  9.11  00:47.4;  Surgeon: Jaye Fallow, MD;  Location: [facility];  Service: Ophthalmology;  Laterality: Left;   CATARACT EXTRACTION W/PHACO Right 08/17/2022   Procedure: CATARACT EXTRACTION PHACO AND INTRAOCULAR LENS  PLACEMENT (IOC) RIGHT CLAREON PANOPTIX TORIC;  Surgeon: Jaye Fallow, MD;  Location: [facility];  Service: Ophthalmology;  Laterality: Right;  14.56 1:18.0   COLONOSCOPY WITH PROPOFOL  N/A 10/07/2021   Procedure: COLONOSCOPY WITH PROPOFOL ;  Surgeon: Toledo, Ladell POUR, MD;  Location: [facility];  Service: Endoscopy;  Laterality: N/A;   CYSTOSCOPY WITH STENT PLACEMENT Right 10/16/2023   Procedure: CYSTOSCOPY, WITH STENT INSERTION;  Surgeon: Sherrilee Belvie CROME, MD;  Location: [facility];  Service: Urology;  Laterality: Right;   HERNIA REPAIR     PROSTATE SURGERY     TONSILLECTOMY     No family history on file. Social History   Tobacco Use   Smoking status: Every Day    Current packs/day: 1.00    Average packs/day: 1 pack/day for 10.0 years (10.0 ttl pk-yrs)    Types: Cigarettes   Smokeless tobacco: Never   Tobacco comments:    2 cigs daily- 10/23/2019  Substance Use Topics   Alcohol use: Yes    Comment: rare   LABS:  Lab Results  Component Value Date   WBC 10.0 10/18/2023   HGB 12.5 (L) 10/18/2023   HCT 36.9 (L) 10/18/2023   MCV 97.9 10/18/2023   PLT 258 10/18/2023   Lab Results  Component Value Date   NA 140 10/18/2023  CL 102 10/18/2023   K 3.7 10/18/2023   CO2 24 10/18/2023   BUN 21 10/18/2023   CREATININE 0.78 10/18/2023   GFRNONAA >60 10/18/2023   CALCIUM  8.9 10/18/2023   ALBUMIN 4.2 10/14/2023   GLUCOSE 148 (H) 10/18/2023    ECG: Date: 10/14/2023  Time ECG obtained: 2007 PM Rate: 114 bpm Rhythm: AJR with IRBBB Axis (leads I and aVF): right Intervals: QRS 114 ms. QTc 454 ms. ST segment and T wave changes: No evidence of acute T wave abnormalities or significant ST segment elevation or depression.  Evidence of a possible, age undetermined, prior infarct:  Yes; inferior Comparison: Previous tracing on 05/24/2023 showed sinus bradycardia with marked sinus arrhythmia with first-degree AV block at rate of 59 bpm; RBBB   IMAGING / PROCEDURES: CT  CHEST WO CONTRAST performed on 10/17/2023 No acute intrathoracic pathology identified. No focal pulmonary infiltrate. Extensive, predominantly left anterior descending, coronary artery calcification. Mild global cardiac size with particular right ventricular enlargement, similar prior examination. Morphologic changes in keeping with pulmonary arterial hypertension. Stable fusiform dilation of the ascending thoracic aorta measuring 4.0 cm in maximal diameter. Multiple subpleural pulmonary nodules within the visualized lung bases measuring up to 5 mm, stable since prior examination and safely considered benign. Aortic atherosclerosis.  CT RENAL STONE STUDY performed on 10/15/2023 4 mm obstructive stone within the mid right ureter with secondary mild right hydroureteronephrosis. Few additional nonobstructive right renal calculi measuring up to 7 mm Few scattered pulmonary nodules about the visualized lung bases, largest of which measures 7 mm at the left lower lobe. Aortic atherosclerosis  CT HEAD WO CONTRAST performed on 10/14/2023 Stable appearance of densely calcified mass in the left middle cranial fossa with adjacent mass effect and vasogenic edema, and 5 mm rightward midline shift of the lateral ventricles. No acute intracranial abnormality  TRANSTHORACIC ECHOCARDIOGRAM performed on 10/16/2023 Left ventricular ejection fraction, by estimation, is 55 to 60%. The left ventricle has normal function. The left ventricle has no regional wall motion abnormalities. Left ventricular diastolic parameters were normal.  Right ventricular systolic function is normal. The right ventricular size is normal.  The mitral valve is normal in structure. Trivial mitral valve regurgitation. No evidence of mitral stenosis.  The aortic valve is normal in structure. Aortic valve regurgitation is mild. Aortic valve sclerosis/calcification is present, without any evidence of aortic stenosis.  The inferior vena cava  is normal in size with greater than 50% respiratory variability, suggesting right atrial pressure of 3 mmHg.   MYOCARDIAL PERFUSION IMAGING STUDY (LEXISCAN ) performed on 07/15/2023 No ST deviation was noted. LV perfusion is normal. There is no evidence of ischemia. There is no evidence of infarction. Left ventricular function is normal. End diastolic cavity size is normal. End systolic cavity size is normal. Coronary calcium  was present on the attenuation correction CT images. Mild coronary calcifications were present. The study is normal. The study is low risk.  IMPRESSION AND PLAN: Mahmoud Blazejewski has been referred for pre-anesthesia review and clearance prior to him undergoing the planned anesthetic and procedural courses. Available labs, pertinent testing, and imaging results were personally reviewed by me in preparation for upcoming operative/procedural course. [facility] medical record has been updated following extensive record review and patient interview with PAT staff.   This patient has been appropriately cleared by cardiology with an overall LOW risk of patient experiencing significant perioperative cardiovascular complications. Based on clinical review performed today (11/03/23), barring any significant acute changes in the patient's overall condition,  it is anticipated that he will be able to proceed with the planned surgical intervention. Any acute changes in clinical condition may necessitate his procedure being postponed and/or cancelled. Patient will meet with anesthesia team (MD and/or CRNA) on the day of his procedure for preoperative evaluation/assessment. Questions regarding anesthetic course will be fielded at that time.   Pre-surgical instructions were reviewed with the patient during his PAT appointment, and questions were fielded to satisfaction by PAT clinical staff. He has been instructed on which medications that he will need to hold prior to surgery, as well as the  ones that have been deemed safe/appropriate to take on the day of his procedure. As part of the general education provided by PAT, patient made aware both verbally and in writing, that he would need to abstain from the use of any illegal substances during his perioperative course. He was advised that failure to follow the provided instructions could necessitate case cancellation or result in serious perioperative complications up to and including death. Patient encouraged to contact PAT and/or his surgeon's office to discuss any questions or concerns that may arise prior to surgery; verbalized understanding.   Dorise Pereyra, MSN, APRN, FNP-C, CEN [facility]  Perioperative Services Nurse Practitioner Phone: [phone] Fax: [phone] 11/03/23 10:34 AM  NOTE: This note has been prepared using Dragon dictation software. Despite my best ability to proofread, there is always the potential that unintentional transcriptional errors may still occur from this process.

## 2023-11-07 ENCOUNTER — Encounter: Payer: Self-pay | Admitting: Urology

## 2023-11-07 ENCOUNTER — Ambulatory Visit

## 2023-11-07 ENCOUNTER — Ambulatory Visit: Payer: Self-pay | Admitting: Urgent Care

## 2023-11-07 ENCOUNTER — Other Ambulatory Visit: Payer: Self-pay

## 2023-11-07 ENCOUNTER — Ambulatory Visit
Admission: RE | Admit: 2023-11-07 | Discharge: 2023-11-07 | Disposition: A | Discharge status: Home / Self Care | Attending: Urology | Admitting: Urology

## 2023-11-07 ENCOUNTER — Encounter
Admission: RE | Disposition: A | Discharge status: Home / Self Care | Payer: Self-pay | Source: Home / Self Care | Attending: Urology

## 2023-11-07 DIAGNOSIS — Z9181 History of falling: Secondary | ICD-10-CM | POA: Diagnosis not present

## 2023-11-07 DIAGNOSIS — I2510 Atherosclerotic heart disease of native coronary artery without angina pectoris: Secondary | ICD-10-CM | POA: Diagnosis not present

## 2023-11-07 DIAGNOSIS — E119 Type 2 diabetes mellitus without complications: Secondary | ICD-10-CM | POA: Diagnosis not present

## 2023-11-07 DIAGNOSIS — Z8546 Personal history of malignant neoplasm of prostate: Secondary | ICD-10-CM | POA: Diagnosis not present

## 2023-11-07 DIAGNOSIS — I440 Atrioventricular block, first degree: Secondary | ICD-10-CM | POA: Insufficient documentation

## 2023-11-07 DIAGNOSIS — D329 Benign neoplasm of meninges, unspecified: Secondary | ICD-10-CM | POA: Insufficient documentation

## 2023-11-07 DIAGNOSIS — A419 Sepsis, unspecified organism: Secondary | ICD-10-CM | POA: Diagnosis not present

## 2023-11-07 DIAGNOSIS — G4733 Obstructive sleep apnea (adult) (pediatric): Secondary | ICD-10-CM | POA: Insufficient documentation

## 2023-11-07 DIAGNOSIS — G4730 Sleep apnea, unspecified: Secondary | ICD-10-CM | POA: Diagnosis not present

## 2023-11-07 DIAGNOSIS — Z923 Personal history of irradiation: Secondary | ICD-10-CM | POA: Diagnosis not present

## 2023-11-07 DIAGNOSIS — E1140 Type 2 diabetes mellitus with diabetic neuropathy, unspecified: Secondary | ICD-10-CM | POA: Insufficient documentation

## 2023-11-07 DIAGNOSIS — I712 Thoracic aortic aneurysm, without rupture, unspecified: Secondary | ICD-10-CM | POA: Diagnosis not present

## 2023-11-07 DIAGNOSIS — F03918 Unspecified dementia, unspecified severity, with other behavioral disturbance: Secondary | ICD-10-CM | POA: Diagnosis not present

## 2023-11-07 DIAGNOSIS — J449 Chronic obstructive pulmonary disease, unspecified: Secondary | ICD-10-CM | POA: Insufficient documentation

## 2023-11-07 DIAGNOSIS — Z79899 Other long term (current) drug therapy: Secondary | ICD-10-CM | POA: Insufficient documentation

## 2023-11-07 DIAGNOSIS — I441 Atrioventricular block, second degree: Secondary | ICD-10-CM | POA: Insufficient documentation

## 2023-11-07 DIAGNOSIS — I110 Hypertensive heart disease with heart failure: Secondary | ICD-10-CM | POA: Diagnosis not present

## 2023-11-07 DIAGNOSIS — K219 Gastro-esophageal reflux disease without esophagitis: Secondary | ICD-10-CM | POA: Insufficient documentation

## 2023-11-07 DIAGNOSIS — Z9079 Acquired absence of other genital organ(s): Secondary | ICD-10-CM | POA: Insufficient documentation

## 2023-11-07 DIAGNOSIS — N202 Calculus of kidney with calculus of ureter: Secondary | ICD-10-CM | POA: Diagnosis not present

## 2023-11-07 DIAGNOSIS — G3184 Mild cognitive impairment, so stated: Secondary | ICD-10-CM | POA: Diagnosis not present

## 2023-11-07 DIAGNOSIS — N201 Calculus of ureter: Secondary | ICD-10-CM

## 2023-11-07 DIAGNOSIS — I5032 Chronic diastolic (congestive) heart failure: Secondary | ICD-10-CM | POA: Insufficient documentation

## 2023-11-07 DIAGNOSIS — I10 Essential (primary) hypertension: Secondary | ICD-10-CM | POA: Diagnosis not present

## 2023-11-07 DIAGNOSIS — N390 Urinary tract infection, site not specified: Secondary | ICD-10-CM | POA: Diagnosis not present

## 2023-11-07 DIAGNOSIS — I714 Abdominal aortic aneurysm, without rupture, unspecified: Secondary | ICD-10-CM | POA: Diagnosis not present

## 2023-11-07 DIAGNOSIS — Z7982 Long term (current) use of aspirin: Secondary | ICD-10-CM | POA: Insufficient documentation

## 2023-11-07 DIAGNOSIS — F17210 Nicotine dependence, cigarettes, uncomplicated: Secondary | ICD-10-CM | POA: Diagnosis not present

## 2023-11-07 DIAGNOSIS — I480 Paroxysmal atrial fibrillation: Secondary | ICD-10-CM | POA: Insufficient documentation

## 2023-11-07 DIAGNOSIS — E785 Hyperlipidemia, unspecified: Secondary | ICD-10-CM | POA: Diagnosis not present

## 2023-11-07 DIAGNOSIS — I452 Bifascicular block: Secondary | ICD-10-CM | POA: Insufficient documentation

## 2023-11-07 HISTORY — DX: Atrioventricular block, second degree: I44.1

## 2023-11-07 HISTORY — DX: Other nonspecific abnormal finding of lung field: R91.8

## 2023-11-07 HISTORY — DX: Dry eye syndrome of bilateral lacrimal glands: H04.123

## 2023-11-07 HISTORY — DX: Tobacco use: Z72.0

## 2023-11-07 HISTORY — DX: Cataract extraction status, right eye: Z98.41

## 2023-11-07 HISTORY — DX: Atherosclerotic heart disease of native coronary artery without angina pectoris: I25.10

## 2023-11-07 HISTORY — DX: Lymphocytic colitis: K52.832

## 2023-11-07 HISTORY — DX: Unspecified right bundle-branch block: I45.10

## 2023-11-07 HISTORY — PX: CYSTOSCOPY W/ RETROGRADES: SHX1426

## 2023-11-07 HISTORY — DX: Paroxysmal atrial fibrillation: I48.0

## 2023-11-07 HISTORY — DX: Calculus of kidney: N20.0

## 2023-11-07 HISTORY — DX: Atrioventricular block, first degree: I44.0

## 2023-11-07 HISTORY — DX: Type 2 diabetes mellitus without complications: E11.9

## 2023-11-07 HISTORY — DX: Long term (current) use of aspirin: Z79.82

## 2023-11-07 HISTORY — DX: Other intervertebral disc degeneration, lumbar region without mention of lumbar back pain or lower extremity pain: M51.369

## 2023-11-07 HISTORY — PX: CYSTOSCOPY/URETEROSCOPY/HOLMIUM LASER/STENT PLACEMENT: SHX6546

## 2023-11-07 HISTORY — DX: Unspecified diastolic (congestive) heart failure: I50.30

## 2023-11-07 HISTORY — DX: Spinal stenosis, cervical region: M48.02

## 2023-11-07 HISTORY — DX: Benign neoplasm of meninges, unspecified: D32.9

## 2023-11-07 HISTORY — DX: Bradycardia, unspecified: R00.1

## 2023-11-07 HISTORY — DX: Atherosclerosis of aorta: I70.0

## 2023-11-07 LAB — GLUCOSE, CAPILLARY
Glucose-Capillary: 112 mg/dL — ABNORMAL HIGH (ref 70–99)
Glucose-Capillary: 114 mg/dL — ABNORMAL HIGH (ref 70–99)

## 2023-11-07 SURGERY — CYSTOSCOPY/URETEROSCOPY/HOLMIUM LASER/STENT PLACEMENT
Anesthesia: General | Laterality: Right

## 2023-11-07 MED ORDER — ORAL CARE MOUTH RINSE: 15.0000 mL | Freq: Once | OROMUCOSAL | Status: AC

## 2023-11-07 MED ORDER — ROCURONIUM BROMIDE 100 MG/10ML IV SOLN
INTRAVENOUS | Status: DC | PRN
  Administered 2023-11-07: 50 mg via INTRAVENOUS

## 2023-11-07 MED ORDER — PROPOFOL 10 MG/ML IV BOLUS
INTRAVENOUS | Status: DC | PRN
  Administered 2023-11-07: 100 mg via INTRAVENOUS

## 2023-11-07 MED ORDER — LACTATED RINGERS IV SOLN: INTRAVENOUS | Status: DC | PRN

## 2023-11-07 MED ORDER — OXYCODONE HCL 5 MG PO TABS: 5.0000 mg | ORAL_TABLET | Freq: Once | ORAL | Status: DC | PRN

## 2023-11-07 MED ORDER — CIPROFLOXACIN IN D5W 400 MG/200ML IV SOLN
INTRAVENOUS | Status: AC
  Filled 2023-11-07: qty 200

## 2023-11-07 MED ORDER — SUGAMMADEX SODIUM 200 MG/2ML IV SOLN
INTRAVENOUS | Status: DC | PRN
  Administered 2023-11-07: 234 mg via INTRAVENOUS

## 2023-11-07 MED ORDER — IOHEXOL 180 MG/ML  SOLN
INTRAMUSCULAR | Status: DC | PRN
  Administered 2023-11-07: 10 mL

## 2023-11-07 MED ORDER — DEXAMETHASONE SODIUM PHOSPHATE 10 MG/ML IJ SOLN
INTRAMUSCULAR | Status: DC | PRN
  Administered 2023-11-07: 5 mg via INTRAVENOUS

## 2023-11-07 MED ORDER — SODIUM CHLORIDE 0.9 % IV SOLN: INTRAVENOUS | Status: DC

## 2023-11-07 MED ORDER — FENTANYL CITRATE (PF) 100 MCG/2ML IJ SOLN
INTRAMUSCULAR | Status: DC | PRN
  Administered 2023-11-07: 25 ug via INTRAVENOUS
  Administered 2023-11-07: 50 ug via INTRAVENOUS
  Administered 2023-11-07: 25 ug via INTRAVENOUS

## 2023-11-07 MED ORDER — CHLORHEXIDINE GLUCONATE 0.12 % MT SOLN
15.0000 mL | Freq: Once | OROMUCOSAL | Status: AC
  Administered 2023-11-07: 15 mL via OROMUCOSAL

## 2023-11-07 MED ORDER — ONDANSETRON HCL 4 MG/2ML IJ SOLN
INTRAMUSCULAR | Status: DC | PRN
Start: 2023-11-07 — End: 2023-11-07
  Administered 2023-11-07: 4 mg via INTRAVENOUS

## 2023-11-07 MED ORDER — TRAMADOL HCL 50 MG PO TABS: 25.0000 mg | ORAL_TABLET | Freq: Four times a day (QID) | ORAL | 0 refills | Status: AC | PRN

## 2023-11-07 MED ORDER — FENTANYL CITRATE (PF) 100 MCG/2ML IJ SOLN
INTRAMUSCULAR | Status: AC
  Filled 2023-11-07: qty 2

## 2023-11-07 MED ORDER — SODIUM CHLORIDE 0.9 % IR SOLN
Status: DC | PRN
  Administered 2023-11-07: 3000 mL via INTRAVESICAL

## 2023-11-07 MED ORDER — FENTANYL CITRATE (PF) 100 MCG/2ML IJ SOLN: 25.0000 ug | INTRAMUSCULAR | Status: DC | PRN

## 2023-11-07 MED ORDER — CHLORHEXIDINE GLUCONATE 0.12 % MT SOLN
OROMUCOSAL | Status: AC
Start: 2023-11-07 — End: 2023-11-07
  Filled 2023-11-07: qty 15

## 2023-11-07 MED ORDER — OXYCODONE HCL 5 MG/5ML PO SOLN: 5.0000 mg | Freq: Once | ORAL | Status: DC | PRN

## 2023-11-07 MED ORDER — SULFAMETHOXAZOLE-TRIMETHOPRIM 800-160 MG PO TABS: 1.0000 | ORAL_TABLET | Freq: Once | ORAL | 0 refills | Status: AC | PRN

## 2023-11-07 MED ORDER — LIDOCAINE HCL (CARDIAC) PF 100 MG/5ML IV SOSY
PREFILLED_SYRINGE | INTRAVENOUS | Status: DC | PRN
  Administered 2023-11-07: 100 mg via INTRAVENOUS

## 2023-11-07 MED ORDER — CIPROFLOXACIN IN D5W 400 MG/200ML IV SOLN
400.0000 mg | INTRAVENOUS | Status: AC
  Administered 2023-11-07: 400 mg via INTRAVENOUS

## 2023-11-07 SURGICAL SUPPLY — 22 items
ADHESIVE MASTISOL STRL (MISCELLANEOUS) IMPLANT
BAG DRAIN SIEMENS DORNER NS (MISCELLANEOUS) ×2 IMPLANT
BRUSH SCRUB EZ 4% CHG (MISCELLANEOUS) ×2 IMPLANT
CATH URET FLEX-TIP 2 LUMEN 10F (CATHETERS) IMPLANT
CATH URETL OPEN 5X70 (CATHETERS) IMPLANT
DRAPE UTILITY 15X26 TOWEL STRL (DRAPES) ×2 IMPLANT
DRSG TEGADERM 2-3/8X2-3/4 SM (GAUZE/BANDAGES/DRESSINGS) IMPLANT
FIBER LASER MOSES 365 DFL (Laser) IMPLANT
GOWN STRL REUS W/ TWL LRG LVL3 (GOWN DISPOSABLE) ×2 IMPLANT
GOWN STRL REUS W/ TWL XL LVL3 (GOWN DISPOSABLE) ×2 IMPLANT
GUIDEWIRE STR DUAL SENSOR (WIRE) ×2 IMPLANT
KIT TURNOVER CYSTO (KITS) ×2 IMPLANT
PACK CYSTO AR (MISCELLANEOUS) ×2 IMPLANT
SET CYSTO W/LG BORE CLAMP LF (SET/KITS/TRAYS/PACK) ×2 IMPLANT
SHEATH NAVIGATOR HD 12/14X36 (SHEATH) IMPLANT
SOL .9 NS 3000ML IRR UROMATIC (IV SOLUTION) ×2 IMPLANT
STENT URET 6FRX24 CONTOUR (STENTS) IMPLANT
STENT URET 6FRX26 CONTOUR (STENTS) IMPLANT
SURGILUBE 2OZ TUBE FLIPTOP (MISCELLANEOUS) ×2 IMPLANT
SYR 10ML LL (SYRINGE) ×2 IMPLANT
VALVE UROSEAL ADJ ENDO (VALVE) IMPLANT
WATER STERILE IRR 500ML POUR (IV SOLUTION) ×2 IMPLANT

## 2023-11-07 NOTE — Anesthesia Preprocedure Evaluation (Addendum)
 Anesthesia Evaluation  Patient identified by MRN, date of birth, ID band Patient awake    Reviewed: Allergy & Precautions, NPO status , Patient's Chart, lab work & pertinent test results  History of Anesthesia Complications Negative for: history of anesthetic complications  Airway Mallampati: III  TM Distance: <3 FB Neck ROM: full    Dental  (+) Chipped, Poor Dentition, Missing   Pulmonary sleep apnea , COPD, Current Smoker and Patient abstained from smoking.   Pulmonary exam normal        Cardiovascular Exercise Tolerance: Good hypertension, + CAD and +CHF  + dysrhythmias Atrial Fibrillation      Neuro/Psych negative neurological ROS  negative psych ROS   GI/Hepatic Neg liver ROS,GERD  Controlled,,  Endo/Other  diabetes, Type 2    Renal/GU Renal disease     Musculoskeletal   Abdominal   Peds  Hematology negative hematology ROS (+)   Anesthesia Other Findings Past Medical History: No date: (HFpEF) heart failure with preserved ejection fraction (HCC) No date: Actinic keratosis No date: Aortic atherosclerosis (HCC) 11/21/2019: Basal cell carcinoma     Comment:  right superior lat pectoral/ EDC 08/12/2022: Basal cell carcinoma     Comment:  Right sideburn. Mohs pending. No date: Bradycardia No date: CAD (coronary artery disease) No date: Cervical spinal stenosis No date: Chronic GERD No date: DDD (degenerative disc disease), lumbar No date: Dry eyes No date: First degree AV block No date: Genital herpes No date: Hepatic steatosis No date: HLD (hyperlipidemia) No date: Hypertension No date: Imbalance No date: Long-term use of aspirin  therapy No date: Lymphocytic colitis No date: Melanoma (HCC)     Comment:  R knee several years ago  No date: Meningioma (HCC)     Comment:  a.) measures approximately 5.1 cm; LEFT middle cranial               fossa with adjacent mass effect and vasogenic edema                causing 5-6 mm rightward midline shift No date: Mild cognitive impairment with memory loss No date: Mobitz type 1 second degree AV block No date: Multiple pulmonary nodules No date: Nephrolithiasis No date: Neuropathy No date: PAF (paroxysmal atrial fibrillation) (HCC)     Comment:  a.) CHA2DS2VASc = 6 (age x2, HFpEF, HTN, vascular               disease, T2DM) as of 11/03/2023; b.) rate/rhythm               maintained) daily without the need for pharmacological               intervention; no chronic OAC No date: Prostate CA (HCC) No date: RBBB (right bundle branch block) No date: Sleep apnea     Comment:  a.) unable to tolerate nocturnal PAP therapy 12/22/2021: Squamous cell carcinoma of skin     Comment:  left forearm-anterior treated wiht EDC No date: Status post bilateral cataract extraction No date: T2DM (type 2 diabetes mellitus) (HCC) No date: Thoracic aortic aneurysm (HCC) No date: Tobacco use  Past Surgical History: No date: APPENDECTOMY 08/03/2022: CATARACT EXTRACTION W/PHACO; Left     Comment:  Procedure: CATARACT EXTRACTION PHACO AND INTRAOCULAR               LENS PLACEMENT (IOC) LEFT CLAREON PANOPTIX TORIC  9.11                00:47.4;  Surgeon: Jaye Fallow,  MD;  Location:               [facility];  Service: Ophthalmology;                Laterality: Left; 08/17/2022: CATARACT EXTRACTION W/PHACO; Right     Comment:  Procedure: CATARACT EXTRACTION PHACO AND INTRAOCULAR               LENS PLACEMENT (IOC) RIGHT CLAREON PANOPTIX TORIC;                Surgeon: Jaye Fallow, MD;  Location: [facility];  Service: Ophthalmology;  Laterality: Right;                14.56 1:18.0 10/07/2021: COLONOSCOPY WITH PROPOFOL ; N/A     Comment:  Procedure: COLONOSCOPY WITH PROPOFOL ;  Surgeon: Toledo,               Ladell POUR, MD;  Location: [facility];  Service:               Endoscopy;  Laterality: N/A; 10/16/2023: CYSTOSCOPY WITH STENT  PLACEMENT; Right     Comment:  Procedure: CYSTOSCOPY, WITH STENT INSERTION;  Surgeon:               Sherrilee Belvie CROME, MD;  Location: [facility];  Service:               Urology;  Laterality: Right; No date: HERNIA REPAIR No date: PROSTATE SURGERY No date: TONSILLECTOMY     Reproductive/Obstetrics negative OB ROS                              Anesthesia Physical Anesthesia Plan  ASA: 3  Anesthesia Plan: General ETT   Post-op Pain Management:    Induction: Intravenous  PONV Risk Score and Plan: Ondansetron , Dexamethasone , Midazolam  and Treatment may vary due to age or medical condition  Airway Management Planned: Oral ETT  Additional Equipment:   Intra-op Plan:   Post-operative Plan: Extubation in OR  Informed Consent: I have reviewed the patients History and Physical, chart, labs and discussed the procedure including the risks, benefits and alternatives for the proposed anesthesia with the patient or authorized representative who has indicated his/her understanding and acceptance.     Dental Advisory Given  Plan Discussed with: Anesthesiologist, CRNA and Surgeon  Anesthesia Plan Comments: (Patient consented for risks of anesthesia including but not limited to:  - adverse reactions to medications - damage to eyes, teeth, lips or other oral mucosa - nerve damage due to positioning  - sore throat or hoarseness - Damage to heart, brain, nerves, lungs, other parts of body or loss of life  Patient voiced understanding and assent.)         Anesthesia Quick Evaluation

## 2023-11-07 NOTE — Op Note (Signed)
 Date of procedure: 11/07/23  Preoperative diagnosis:  Right ureteral stone Right renal stone  Postoperative diagnosis:  Same  Procedure: Cystoscopy Right ureteroscopy, laser lithotripsy of proximal ureteral stone Right ureteroscopy, laser lithotripsy of renal stone Right retrograde pyelogram with intraoperative interpretation Right ureteral stent placement  Surgeon: Redell Burnet, MD  Anesthesia: General  Complications: None  Intraoperative findings:  Prostate surgically absent, normal bladder 2.  Uncomplicated dusting of right proximal ureteral stone and right renal stone, stent placed with Dangler  EBL: Minimal  Specimens: None  Drains: Right 6 French by 26 cm ureteral stent  Indication: Jason Washington is a 78 y.o. patient with right ureteral and renal stone previously stented for infection here today for definitive management with ureteroscopy.  After reviewing the management options for treatment, they elected to proceed with the above surgical procedure(s). We have discussed the potential benefits and risks of the procedure, side effects of the proposed treatment, the likelihood of the patient achieving the goals of the procedure, and any potential problems that might occur during the procedure or recuperation. Informed consent has been obtained.  Description of procedure:  The patient was taken to the operating room and general anesthesia was induced. SCDs were placed for DVT prophylaxis. The patient was placed in the dorsal lithotomy position, prepped and draped in the usual sterile fashion, and preoperative antibiotics were administered. A preoperative time-out was performed.   A 21 French rigid cystoscope was used to intubate the urethra and a normal-appearing urethra was followed proximally to the bladder.  The prostate was surgically absent.  Thorough cystoscopy showed no abnormalities.  The right ureteral stent was grasped and pulled to the meatus and a sensor  wire advanced through the stent up to the kidney.  The old stent was removed.  A semirigid long ureteroscope was advanced alongside the wire up to the mid ureter, no stone was identified.  I then advanced the digital single-channel flexible ureteroscope over the wire and met resistance in the proximal ureter where there was a 5 mm stone.  A 365 m laser fiber on settings of 1.0 J and 10 Hz was used to methodically dust the stone.  The digital single-channel flexible ureteroscope was then advanced up into the kidney and thorough pyeloscopy revealed a 5 mm stone in the midpole.  The laser fiber on settings of 0.5 J and 80 Hz was used to methodically dust the renal stone.  Thorough pyeloscopy revealed no other stone fragments larger than the laser fiber.  Retrograde pyelogram was performed from the proximal ureter and showed no extravasation or filling defects.  Careful pullback ureteroscopy showed no other abnormalities or ridges or stones.  A wire was replaced through the scope.  The rigid cystoscope was backloaded over the wire and a 6 [place] by 26 cm ureteral stent was placed uneventfully with a curl in the kidney as well as in the bladder under direct vision.  The bladder was drained.  The Dangler was secured to the dorsal aspect of the phallus with Mastisol and Tegaderm.  Disposition: Stable to PACU  Plan: Can remove stent at home on Friday morning  Redell Burnet, MD

## 2023-11-07 NOTE — Interval H&P Note (Signed)
 UROLOGY H&P UPDATE  Agree with prior H&P dated 10/15/2023 by Dr. Nieves.  Right ureteral stone previously stented for concern for infection, here today for definitive management with ureteroscopy and laser lithotripsy and stent placement  Cardiac: RRR Lungs: CTA bilaterally  Laterality: Right Procedure: Right ureteroscopy, laser lithotripsy, stent placement  Urine: Culture 8/29 no growth  We specifically discussed the risks ureteroscopy including bleeding, infection/sepsis, stent related symptoms including flank pain/urgency/frequency/incontinence/dysuria, ureteral injury, ureteral stricture, inability to access stone, or need for staged or additional procedures.   Redell [name], MD 11/07/2023

## 2023-11-07 NOTE — Anesthesia Procedure Notes (Signed)
 Procedure Name: Intubation Date/Time: 11/07/2023 11:44 AM  Performed by: Veronica Alm BROCKS, CRNAPre-anesthesia Checklist: Patient identified, Patient being monitored, Timeout performed, Emergency Drugs available and Suction available Patient Re-evaluated:Patient Re-evaluated prior to induction Oxygen  Delivery Method: Circle system utilized Preoxygenation: Pre-oxygenation with 100% oxygen  Induction Type: IV induction Ventilation: Two handed mask ventilation required and Oral airway inserted - appropriate to patient size Laryngoscope Size: McGrath and 4 Grade View: Grade I Tube type: Oral Tube size: 7.0 mm Number of attempts: 1 Airway Equipment and Method: Stylet Placement Confirmation: ETT inserted through vocal cords under direct vision, positive ETCO2 and breath sounds checked- equal and bilateral Secured at: 23 cm Tube secured with: Tape Dental Injury: Teeth and Oropharynx as per pre-operative assessment

## 2023-11-07 NOTE — Transfer of Care (Signed)
 Immediate Anesthesia Transfer of Care Note  Patient: Jason Washington  Procedure(s) Performed: Procedure(s) with comments: CYSTOSCOPY/URETEROSCOPY/HOLMIUM LASER/STENT PLACEMENT (Right) - EXCHANGE CYSTOSCOPY, WITH RETROGRADE PYELOGRAM  Patient Location: PACU  Anesthesia Type:General  Level of Consciousness: sedated  Airway & Oxygen  Therapy: Patient Spontanous Breathing and Patient connected to face mask oxygen   Post-op Assessment: Report given to RN and Post -op Vital signs reviewed and stable  Post vital signs: Reviewed and stable  Last Vitals:  Vitals:   11/07/23 1220 11/07/23 1222  BP: (!) 174/77   Pulse: 73 73  Resp: 12 13  Temp: 36.4 C   SpO2: 99% 100%    Complications: No apparent anesthesia complications

## 2023-11-08 ENCOUNTER — Encounter: Payer: Self-pay | Admitting: Urology

## 2023-11-08 DIAGNOSIS — I440 Atrioventricular block, first degree: Secondary | ICD-10-CM | POA: Diagnosis not present

## 2023-11-08 NOTE — Anesthesia Postprocedure Evaluation (Signed)
 Anesthesia Post Note  Patient: Jason Washington  Procedure(s) Performed: CYSTOSCOPY/URETEROSCOPY/HOLMIUM LASER/STENT PLACEMENT (Right) CYSTOSCOPY, WITH RETROGRADE PYELOGRAM  Patient location during evaluation: PACU Anesthesia Type: General Level of consciousness: awake and alert Pain management: pain level controlled Vital Signs Assessment: post-procedure vital signs reviewed and stable Respiratory status: spontaneous breathing, nonlabored ventilation and respiratory function stable Cardiovascular status: blood pressure returned to baseline and stable Postop Assessment: no apparent nausea or vomiting Anesthetic complications: no   There were no known notable events for this encounter.   Last Vitals:  Vitals:   11/07/23 1330 11/07/23 1348  BP: (!) 145/76 (!) 157/96  Pulse: 72 89  Resp: 17 18  Temp: (!) 36.2 C (!) 36.1 C  SpO2: 93% 94%    Last Pain:  Vitals:   11/07/23 1348  TempSrc: Temporal  PainSc: 0-No pain                 Fairy POUR Muneer Leider

## 2023-11-15 ENCOUNTER — Encounter: Admitting: Urology

## 2023-11-17 DIAGNOSIS — D329 Benign neoplasm of meninges, unspecified: Secondary | ICD-10-CM | POA: Diagnosis not present

## 2023-11-18 ENCOUNTER — Other Ambulatory Visit: Payer: Self-pay | Admitting: Urology

## 2023-11-18 DIAGNOSIS — N201 Calculus of ureter: Secondary | ICD-10-CM

## 2023-11-24 DIAGNOSIS — G936 Cerebral edema: Secondary | ICD-10-CM | POA: Diagnosis not present

## 2023-11-24 DIAGNOSIS — D329 Benign neoplasm of meninges, unspecified: Secondary | ICD-10-CM | POA: Diagnosis not present

## 2023-12-20 DIAGNOSIS — I2510 Atherosclerotic heart disease of native coronary artery without angina pectoris: Secondary | ICD-10-CM | POA: Diagnosis not present

## 2023-12-20 DIAGNOSIS — Z7689 Persons encountering health services in other specified circumstances: Secondary | ICD-10-CM | POA: Diagnosis not present

## 2024-01-03 DIAGNOSIS — K219 Gastro-esophageal reflux disease without esophagitis: Secondary | ICD-10-CM | POA: Diagnosis not present

## 2024-01-03 DIAGNOSIS — K52832 Lymphocytic colitis: Secondary | ICD-10-CM | POA: Diagnosis not present

## 2024-01-03 DIAGNOSIS — K862 Cyst of pancreas: Secondary | ICD-10-CM | POA: Diagnosis not present

## 2024-01-16 DIAGNOSIS — E782 Mixed hyperlipidemia: Secondary | ICD-10-CM | POA: Diagnosis not present

## 2024-01-16 DIAGNOSIS — I2510 Atherosclerotic heart disease of native coronary artery without angina pectoris: Secondary | ICD-10-CM | POA: Diagnosis not present

## 2024-01-16 DIAGNOSIS — Z87891 Personal history of nicotine dependence: Secondary | ICD-10-CM | POA: Diagnosis not present

## 2024-01-16 DIAGNOSIS — A6000 Herpesviral infection of urogenital system, unspecified: Secondary | ICD-10-CM | POA: Diagnosis not present

## 2024-01-16 DIAGNOSIS — E119 Type 2 diabetes mellitus without complications: Secondary | ICD-10-CM | POA: Diagnosis not present

## 2024-01-16 DIAGNOSIS — K219 Gastro-esophageal reflux disease without esophagitis: Secondary | ICD-10-CM | POA: Diagnosis not present

## 2024-01-16 DIAGNOSIS — I10 Essential (primary) hypertension: Secondary | ICD-10-CM | POA: Diagnosis not present

## 2024-01-16 DIAGNOSIS — C61 Malignant neoplasm of prostate: Secondary | ICD-10-CM | POA: Diagnosis not present

## 2024-01-16 DIAGNOSIS — M47816 Spondylosis without myelopathy or radiculopathy, lumbar region: Secondary | ICD-10-CM | POA: Diagnosis not present

## 2024-01-16 DIAGNOSIS — I440 Atrioventricular block, first degree: Secondary | ICD-10-CM | POA: Diagnosis not present

## 2024-01-16 DIAGNOSIS — M51369 Other intervertebral disc degeneration, lumbar region without mention of lumbar back pain or lower extremity pain: Secondary | ICD-10-CM | POA: Diagnosis not present

## 2024-01-16 DIAGNOSIS — G629 Polyneuropathy, unspecified: Secondary | ICD-10-CM | POA: Diagnosis not present

## 2024-02-14 ENCOUNTER — Other Ambulatory Visit: Payer: Self-pay

## 2024-02-14 ENCOUNTER — Emergency Department

## 2024-02-14 ENCOUNTER — Emergency Department
Admission: EM | Admit: 2024-02-14 | Discharge: 2024-02-14 | Disposition: A | Discharge status: Home / Self Care | Attending: Emergency Medicine | Admitting: Emergency Medicine

## 2024-02-14 DIAGNOSIS — R079 Chest pain, unspecified: Secondary | ICD-10-CM | POA: Diagnosis not present

## 2024-02-14 DIAGNOSIS — R4781 Slurred speech: Secondary | ICD-10-CM | POA: Diagnosis not present

## 2024-02-14 DIAGNOSIS — Z86011 Personal history of benign neoplasm of the brain: Secondary | ICD-10-CM | POA: Diagnosis not present

## 2024-02-14 DIAGNOSIS — R531 Weakness: Secondary | ICD-10-CM | POA: Diagnosis present

## 2024-02-14 LAB — CBC WITH DIFFERENTIAL/PLATELET
Abs Immature Granulocytes: 0.09 K/uL — ABNORMAL HIGH (ref 0.00–0.07)
Basophils Absolute: 0 K/uL (ref 0.0–0.1)
Basophils Relative: 0 %
Eosinophils Absolute: 0.7 K/uL — ABNORMAL HIGH (ref 0.0–0.5)
Eosinophils Relative: 8 %
HCT: 41.2 % (ref 39.0–52.0)
Hemoglobin: 13.4 g/dL (ref 13.0–17.0)
Immature Granulocytes: 1 %
Lymphocytes Relative: 26 %
Lymphs Abs: 2.3 K/uL (ref 0.7–4.0)
MCH: 33.2 pg (ref 26.0–34.0)
MCHC: 32.5 g/dL (ref 30.0–36.0)
MCV: 102 fL — ABNORMAL HIGH (ref 80.0–100.0)
Monocytes Absolute: 1 K/uL (ref 0.1–1.0)
Monocytes Relative: 11 %
Neutro Abs: 4.8 K/uL (ref 1.7–7.7)
Neutrophils Relative %: 54 %
Platelets: 217 K/uL (ref 150–400)
RBC: 4.04 MIL/uL — ABNORMAL LOW (ref 4.22–5.81)
RDW: 13.8 % (ref 11.5–15.5)
Smear Review: NORMAL
WBC: 9 K/uL (ref 4.0–10.5)
nRBC: 0 % (ref 0.0–0.2)

## 2024-02-14 LAB — COMPREHENSIVE METABOLIC PANEL WITH GFR
ALT: 16 U/L (ref 0–44)
AST: 17 U/L (ref 15–41)
Albumin: 3.8 g/dL (ref 3.5–5.0)
Alkaline Phosphatase: 53 U/L (ref 38–126)
Anion gap: 12 (ref 5–15)
BUN: 12 mg/dL (ref 8–23)
CO2: 24 mmol/L (ref 22–32)
Calcium: 9 mg/dL (ref 8.9–10.3)
Chloride: 102 mmol/L (ref 98–111)
Creatinine, Ser: 0.88 mg/dL (ref 0.61–1.24)
GFR, Estimated: >60 mL/min
Glucose, Bld: 132 mg/dL — ABNORMAL HIGH (ref 70–99)
Potassium: 4.1 mmol/L (ref 3.5–5.1)
Sodium: 138 mmol/L (ref 135–145)
Total Bilirubin: 0.8 mg/dL (ref 0.0–1.2)
Total Protein: 7 g/dL (ref 6.5–8.1)

## 2024-02-14 LAB — URINALYSIS, ROUTINE W REFLEX MICROSCOPIC
Bilirubin Urine: NEGATIVE
Glucose, UA: NEGATIVE mg/dL
Hgb urine dipstick: NEGATIVE
Ketones, ur: NEGATIVE mg/dL
Leukocytes,Ua: NEGATIVE
Nitrite: NEGATIVE
Protein, ur: NEGATIVE mg/dL
Specific Gravity, Urine: 1.027 (ref 1.005–1.030)
pH: 5 (ref 5.0–8.0)

## 2024-02-14 NOTE — ED Provider Notes (Addendum)
 "  [facility] Provider Note    Event Date/Time   First MD Initiated Contact with Patient 02/14/24 1639     (approximate)   History   Weakness   HPI  Jason Washington is a 78 y.o. male who presents to the ED for evaluation of Weakness   I reviewed PCP visit from 12/1.  History of large meningioma, offered surgery but declined in the past in the setting of various medical illnesses such as urosepsis and need for urologic procedures.  MRI brain from October with stable disease. Recurrent ureteral stones, sepsis and needing ureteral stenting/lithotripsy in the past.  Patient presents to the ED alongside his wife for evaluation of acute on chronic bilateral leg weakness.  They report 6 months - 1 year of bilateral leg weakness that seems to be progressive, further progressive over the past 1 week.  They had to pull out a walker last night that he has not used much in the past to help him get around due to his leg weakness and difficulty ambulating.  No significant falls or injuries, no syncope, no unilateral weakness.  No urinary symptoms, emesis or stool changes, no cough, congestion or fever.  Physical Exam   Triage Vital Signs: ED Triage Vitals  Encounter Vitals Group     BP 02/14/24 1400 (!) 153/92     Girls Systolic BP Percentile --      Girls Diastolic BP Percentile --      Boys Systolic BP Percentile --      Boys Diastolic BP Percentile --      Pulse Rate 02/14/24 1400 95     Resp 02/14/24 1400 17     Temp 02/14/24 1400 98.7 F (37.1 C)     Temp Source 02/14/24 1400 Oral     SpO2 02/14/24 1400 95 %     Weight 02/14/24 1402 264 lb 8.8 oz (120 kg)     Height 02/14/24 1402 5' 11 (1.803 m)     Head Circumference --      Peak Flow --      Pain Score 02/14/24 1401 0     Pain Loc --      Pain Education --      Exclude from Growth Chart --     Most recent vital signs: Vitals:   02/14/24 1700 02/14/24 1730  BP: (!) 117/93 (!) 142/75  Pulse:  97 94  Resp: 16 16  Temp:    SpO2: 95% 94%    General: Awake, no distress.  CV:  Good peripheral perfusion.  Resp:  Normal effort.  Abd:  No distention.  MSK:  No deformity noted.  Neuro:  No focal deficits appreciated. Other:     ED Results / Procedures / Treatments   Labs (all labs ordered are listed, but only abnormal results are displayed) Labs Reviewed  CBC WITH DIFFERENTIAL/PLATELET - Abnormal; Notable for the following components:      Result Value   RBC 4.04 (*)    MCV 102.0 (*)    Eosinophils Absolute 0.7 (*)    Abs Immature Granulocytes 0.09 (*)    All other components within normal limits  COMPREHENSIVE METABOLIC PANEL WITH GFR - Abnormal; Notable for the following components:   Glucose, Bld 132 (*)    All other components within normal limits  URINALYSIS, ROUTINE W REFLEX MICROSCOPIC - Abnormal; Notable for the following components:   Color, Urine YELLOW (*)    APPearance CLEAR (*)  All other components within normal limits    EKG Sinus rhythm with a rate of 94 bpm, right bundle, no STEMI.  RADIOLOGY CT head interpreted by me without evidence of acute intracranial pathology.  Known meningioma and midline shift, stable from MRI nearly 3 months ago.  Official radiology report(s): CT Head Wo Contrast Result Date: 02/14/2024 EXAM: CT HEAD WITHOUT 02/14/2024 02:47:08 PM TECHNIQUE: CT of the head was performed without the administration of intravenous contrast. Automated exposure control, iterative reconstruction, and/or weight based adjustment of the mA/kV was utilized to reduce the radiation dose to as low as reasonably achievable. COMPARISON: 10/14/2023 CLINICAL HISTORY: weakness, brain tumor FINDINGS: BRAIN AND VENTRICLES: A large calcified mass in the left middle cranial fossa is again noted with surrounding vasogenic edema. Cystic components are again noted superiorly. 6 mm of left to right midline shift. This has previously been characterized as a  meningioma on prior MRI. This is unchanged. The amount of surrounding vasogenic edema is stable. Left posterior fossa arachnoid cyst again noted, stable. No acute infarct or hemorrhage. No hydrocephalus. No extra-axial fluid collection. ORBITS: No acute abnormality. SINUSES AND MASTOIDS: No acute abnormality. SOFT TISSUES AND SKULL: No acute skull fracture. No acute soft tissue abnormality. IMPRESSION: 1. Large calcified left middle cranial fossa mass with surrounding vasogenic edema, cystic components superiorly, and 6 mm of left-to-right midline shift, previously characterized as a meningioma, stable. 2. No acute intracranial abnormality. Electronically signed by: Franky Crease MD 02/14/2024 03:38 PM EST RP Workstation: [id number]   DG Chest 2 View Result Date: 02/14/2024 EXAM: 2 VIEW(S) XRAY OF THE CHEST 02/14/2024 02:19:03 PM COMPARISON: 10/14/2023 CLINICAL HISTORY: cough, possible aspiration FINDINGS: LUNGS AND PLEURA: No focal pulmonary opacity. No pleural effusion. No pneumothorax. HEART AND MEDIASTINUM: Mild cardiomegaly. No acute abnormality of the mediastinal silhouette. BONES AND SOFT TISSUES: No acute osseous abnormality. IMPRESSION: 1. No acute findings. Electronically signed by: Franky Crease MD 02/14/2024 03:16 PM EST RP Workstation: [id number]    PROCEDURES and INTERVENTIONS:  Procedures  Medications - No data to display   IMPRESSION / MDM / ASSESSMENT AND PLAN / ED COURSE  I reviewed the triage vital signs and the nursing notes.  Differential diagnosis includes, but is not limited to, progression intracranial mass or vasogenic edema, stroke, deconditioning, AKI, viral syndrome, UTI  {Patient presents with symptoms of an acute illness or injury that is potentially life-threatening.  Patient presents with acute on chronic generalized weakness suitable for outpatient management after benign workup.  Looks well to me with a nonfocal exam and no reproducible deficits.  Normal metabolic  panel, CBC, urine without infectious features.  CT head with meningioma that is known at baseline, CXR without infiltrate.  He is up and ambulatory with a walker, which they have at home.  No barriers to outpatient management at this point.  Discussed ED return precautions  Clinical Course as of 02/14/24 1744  Tue Feb 14, 2024  1733 Reassessed, discussed reassuring urinalysis, EKG and workup overall.  Patient has a desire to go home.  We discussed my desire to at least see him get up and ambulatory with a walker to ensure safe functioning at home with set up he has there. [DS]  1743 Patient up and ambulatory with a walker independently walking fairly briskly.  Stable gait, ready to go [DS]    Clinical Course User Index [DS] Claudene Rover, MD     FINAL CLINICAL IMPRESSION(S) / ED DIAGNOSES   Final diagnoses:  Generalized weakness  Rx / DC Orders   ED Discharge Orders     None        Note:  This document was prepared using Dragon voice recognition software and may include unintentional dictation errors.   Claudene Rover, MD 02/14/24 1745    Claudene Rover, MD 02/14/24 1745  "

## 2024-02-14 NOTE — Discharge Instructions (Addendum)
Use Tylenol for pain and fevers.  Up to 1000 mg per dose, up to 4 times per day.  Do not take more than 4000 mg of Tylenol/acetaminophen within 24 hours..  

## 2024-02-14 NOTE — ED Notes (Signed)
 Pt ambulated with walker down the hallway without assistance of staff. Pt reports that on the way back he started to feel like his legs were getting weaker. This RN noticed that on the way back the pt's pace slowed down and pt started shuffling feet more. Pt back in bed with VS monitoring equipment hooked back up, call bell in reach and bed in lowest, locked position.

## 2024-02-14 NOTE — ED Triage Notes (Signed)
 Pt reports difficulty walking and standing along with difficulty putting sentences together. Pt says he can't walk now. Pt diagnosed with brain tumor over the summer. Pt has not had surgery due to other medical complications. There are also concerns for aspiration when he eats, as he coughs now when he eats.

## 2024-02-14 NOTE — ED Notes (Addendum)
 Pt reports that his legs feel numb but can still feel his lower extremities and this has been going on for a few months. Pt reports that he was diagnosed with a brain tumor about a year and a half ago. Pt states that he feels extra weak over the last 4-5 days and is unable to walk. Pt reports sliding out of his chair 4-5 days ago and said that he did not get hurt. Pt reports that when he takes deep breaths he has chest discomfort. Pt also expresses concerns about having thick secretions and not being able to get rid of it. Pt denies pain at this time. Pt A&Ox4 at this time.

## 2024-02-14 NOTE — ED Triage Notes (Signed)
 BIB by [facility] from home. C/O leg weakness and slurred speech since diagnosis of brain tumor. Recent diagnosis of brain tumor over the summer and weakness has been gradually worsening since that time.  Fell last week, since the fall c/o chest pain with deep inspiration.   Symptoms have worsened over past week.  VS wnl.

## 2024-03-08 ENCOUNTER — Ambulatory Visit

## 2024-03-08 NOTE — Therapy (Incomplete)
 " OUTPATIENT PHYSICAL THERAPY NEURO EVALUATION   Patient Name: Jason Washington MRN: [id number] DOB:1945-04-20, 79 y.o., male Today's Date: 03/08/2024   PCP: Edsel Pepper, PA-C REFERRING PROVIDER: Edsel Pepper, PA-C  END OF SESSION:   Past Medical History:  Diagnosis Date   (HFpEF) heart failure with preserved ejection fraction (HCC)    Actinic keratosis    Aortic atherosclerosis    Basal cell carcinoma 11/21/2019   right superior lat pectoral/ EDC   Basal cell carcinoma 08/12/2022   Right sideburn. Mohs pending.   Bradycardia    CAD (coronary artery disease)    Cervical spinal stenosis    Chronic GERD    DDD (degenerative disc disease), lumbar    Dry eyes    First degree AV block    Genital herpes    Hepatic steatosis    HLD (hyperlipidemia)    Hypertension    Imbalance    Long-term use of aspirin  therapy    Lymphocytic colitis    Melanoma (HCC)    R knee several years ago    Meningioma (HCC)    a.) measures approximately 5.1 cm; LEFT middle cranial fossa with adjacent mass effect and vasogenic edema causing 5-6 mm rightward midline shift   Mild cognitive impairment with memory loss    Mobitz type 1 second degree AV block    Multiple pulmonary nodules    Nephrolithiasis    Neuropathy    PAF (paroxysmal atrial fibrillation) (HCC)    a.) CHA2DS2VASc = 6 (age x2, HFpEF, HTN, vascular disease, T2DM) as of 11/03/2023; b.) rate/rhythm maintained) daily without the need for pharmacological intervention; no chronic OAC   Prostate CA (HCC)    RBBB (right bundle branch block)    Sleep apnea    a.) unable to tolerate nocturnal PAP therapy   Squamous cell carcinoma of skin 12/22/2021   left forearm-anterior treated wiht EDC   Status post bilateral cataract extraction    T2DM (type 2 diabetes mellitus) (HCC)    Thoracic aortic aneurysm    Tobacco use    Past Surgical History:  Procedure Laterality Date   APPENDECTOMY     CATARACT EXTRACTION W/PHACO Left  08/03/2022   Procedure: CATARACT EXTRACTION PHACO AND INTRAOCULAR LENS PLACEMENT (IOC) LEFT CLAREON PANOPTIX TORIC  9.11  00:47.4;  Surgeon: Jaye Fallow, MD;  Location: [facility];  Service: Ophthalmology;  Laterality: Left;   CATARACT EXTRACTION W/PHACO Right 08/17/2022   Procedure: CATARACT EXTRACTION PHACO AND INTRAOCULAR LENS PLACEMENT (IOC) RIGHT CLAREON PANOPTIX TORIC;  Surgeon: Jaye Fallow, MD;  Location: [facility];  Service: Ophthalmology;  Laterality: Right;  14.56 1:18.0   COLONOSCOPY WITH PROPOFOL  N/A 10/07/2021   Procedure: COLONOSCOPY WITH PROPOFOL ;  Surgeon: Toledo, Ladell POUR, MD;  Location: [facility];  Service: Endoscopy;  Laterality: N/A;   CYSTOSCOPY W/ RETROGRADES  11/07/2023   Procedure: CYSTOSCOPY, WITH RETROGRADE PYELOGRAM;  Surgeon: Francisca Redell BROCKS, MD;  Location: [facility];  Service: Urology;;   CYSTOSCOPY WITH STENT PLACEMENT Right 10/16/2023   Procedure: CYSTOSCOPY, WITH STENT INSERTION;  Surgeon: Sherrilee Belvie CROME, MD;  Location: [facility];  Service: Urology;  Laterality: Right;   CYSTOSCOPY/URETEROSCOPY/HOLMIUM LASER/STENT PLACEMENT Right 11/07/2023   Procedure: CYSTOSCOPY/URETEROSCOPY/HOLMIUM LASER/STENT PLACEMENT;  Surgeon: Francisca Redell BROCKS, MD;  Location: [facility];  Service: Urology;  Laterality: Right;  EXCHANGE   HERNIA REPAIR     PROSTATE SURGERY     TONSILLECTOMY     Patient Active Problem List   Diagnosis Date Noted   Ascending  aortic aneurysm 10/18/2023   Bacteremia due to Proteus species 10/18/2023   Meningioma (HCC) 10/15/2023   Smoker 10/15/2023   Sepsis due to urinary tract infection (HCC) 10/14/2023   Acute metabolic encephalopathy 10/14/2023   Chronic diastolic CHF (congestive heart failure) (HCC) 10/14/2023   Myocardial injury 10/14/2023   Obesity (BMI 30-39.9) 10/14/2023   Hypertension    Mild cognitive impairment with memory loss    Sleep apnea    HLD (hyperlipidemia)    Diabetes mellitus without complication  (HCC)    Dyspnea on exertion 11/05/2019   Witnessed episode of apnea 11/05/2019   Pulmonary nodules 11/05/2019    ONSET DATE: ***  REFERRING DIAG:  R26.81 (ICD-10-CM) - Unsteady gait  R53.1 (ICD-10-CM) - Generalized weakness  M47.816 (ICD-10-CM) - Spondylosis of lumbar region without myelopathy or radiculopathy  M51.369 (ICD-10-CM) - Degeneration of intervertebral disc of lumbar region, unspecified whether pain present    THERAPY DIAG:  No diagnosis found.  Rationale for Evaluation and Treatment: Rehabilitation  SUBJECTIVE:                                                                                                                                                                                             SUBJECTIVE STATEMENT: *** Pt accompanied by: {accompnied:27141}  PERTINENT HISTORY: Referral from [facility] Neurology on 03/06/2024- for balance and gait and Generalized weakness, Spondylosis of lumbar region without myelopathy or radiculopathy, and degeneration of intervertebral disc of lumbar region. Pertinent history obtained from recent [facility] neurology telehealth visit on 02/29/2024. History of Present Illness Jason Washington. is a 79 year old male with a meningioma who presents with unsteady gait and speech difficulties.  He experiences an unsteady gait, describing his walking as 'not secure' and losing balance easily. There is no significant pain in his hip or legs, but he feels generalized weakness, especially when standing up from a chair. He cannot feel his body weight on his hips and experiences fatigue quickly after walking short distances, requiring frequent rests. He presented to the ED two weeks ago for concern of generalized weakness - his workup was unremarkable and he was advised to follow-up outpatient. Although he has not been using any assistive devices regularly, he found a walker helpful during a particularly weak spell.  He reports speech difficulties,  including slurred speech and trouble finding words, which he feels is worsening over time. He is concerned about his ability to communicate effectively, which is frustrating for both him and his wife.  He has a known meningioma, which was stable on a recent head CT performed two weeks ago. He is followed by neurosurgery  and has decided to withhold surgery in the setting of various medical illnesses including urosepsis and nephrolithiasis. He has been followed by neurology in the past and has been prescribed Aricept  for memory issues, however his wife discontinued his medicatio nin 05/2023 because she didn't want him taking this. He also has known history of B12 deficiency, requiring injections in the past. He takes oral B12 supplements daily.   He denies any pain in his hips or knees when walking and no leg cramps or pain that improves with rest. He describes his symptoms as physical fatigue that resolves quickly with rest. He has known lumbar degenerative disc disease, lumbar spondylosis, and neuropathy, for which he has been followed by physiatry in the past. He was prescribed gabapentin in the past but is no longer taking this.    PAIN:  Are you having pain? {OPRCPAIN:27236}  PRECAUTIONS: {Therapy precautions:24002}  RED FLAGS: {PT Red Flags:29287}   WEIGHT BEARING RESTRICTIONS: {Yes ***/No:24003}  FALLS: Has patient fallen in last 6 months? {fallsyesno:27318}  LIVING ENVIRONMENT: Lives with: {OPRC lives with:25569::lives with their family} Lives in: {Lives in:25570} Stairs: {opstairs:27293} Has following equipment at home: {Assistive devices:23999}  PLOF: {PLOF:24004}  PATIENT GOALS: ***  OBJECTIVE:  Note: Objective measures were completed at Evaluation unless otherwise noted.  DIAGNOSTIC FINDINGS: ***  COGNITION: Overall cognitive status: {cognition:24006}   SENSATION: {sensation:27233}  COORDINATION: ***  EDEMA:  {edema:24020}  MUSCLE TONE: {LE  tone:25568}  MUSCLE LENGTH: Hamstrings: Right *** deg; Left *** deg Debby test: Right *** deg; Left *** deg  DTRs:  {DTR SITE:24025}  POSTURE: {posture:25561}  LOWER EXTREMITY ROM:     {AROM/PROM:27142}  Right Eval Left Eval  Hip flexion    Hip extension    Hip abduction    Hip adduction    Hip internal rotation    Hip external rotation    Knee flexion    Knee extension    Ankle dorsiflexion    Ankle plantarflexion    Ankle inversion    Ankle eversion     (Blank rows = not tested)  LOWER EXTREMITY MMT:    MMT Right Eval Left Eval  Hip flexion    Hip extension    Hip abduction    Hip adduction    Hip internal rotation    Hip external rotation    Knee flexion    Knee extension    Ankle dorsiflexion    Ankle plantarflexion    Ankle inversion    Ankle eversion    (Blank rows = not tested)  BED MOBILITY:  {bed mobility:32615:p}  TRANSFERS: {transfers eval:32620}  RAMP:  {ramp eval:32616}  CURB:  {curb eval:32617}  STAIRS: {stairs eval:32618} GAIT: Findings: {GaitneuroPT:32644::Distance walked: ***,Comments: ***}  FUNCTIONAL TESTS:  {Functional tests:24029}  PATIENT SURVEYS:  {rehab surveys:24030}  TREATMENT DATE: ***    PATIENT EDUCATION: Education details: *** Person educated: {Person educated:25204} Education method: {Education Method:25205} Education comprehension: {Education Comprehension:25206}  HOME EXERCISE PROGRAM: ***  GOALS: Goals reviewed with patient? {yes/no:20286}  SHORT TERM GOALS: Target date: ***  *** Baseline: Goal status: INITIAL  2.  *** Baseline:  Goal status: INITIAL  3.  *** Baseline:  Goal status: INITIAL  4.  *** Baseline:  Goal status: INITIAL  5.  *** Baseline:  Goal status: INITIAL  6.  *** Baseline:  Goal status: INITIAL  LONG TERM GOALS: Target date:  ***  *** Baseline:  Goal status: INITIAL  2.  *** Baseline:  Goal status: INITIAL  3.  *** Baseline:  Goal status: INITIAL  4.  *** Baseline:  Goal status: INITIAL  5.  *** Baseline:  Goal status: INITIAL  6.  *** Baseline:  Goal status: INITIAL  ASSESSMENT:  CLINICAL IMPRESSION: Patient is a *** y.o. *** who was seen today for physical therapy evaluation and treatment for ***.   OBJECTIVE IMPAIRMENTS: {opptimpairments:25111}.   ACTIVITY LIMITATIONS: {activitylimitations:27494}  PARTICIPATION LIMITATIONS: {participationrestrictions:25113}  PERSONAL FACTORS: {Personal factors:25162} are also affecting patient's functional outcome.   REHAB POTENTIAL: {rehabpotential:25112}  CLINICAL DECISION MAKING: {clinical decision making:25114}  EVALUATION COMPLEXITY: {Evaluation complexity:25115}  PLAN:  PT FREQUENCY: {rehab frequency:25116}  PT DURATION: {rehab duration:25117}  PLANNED INTERVENTIONS: {rehab planned interventions:25118::97110-Therapeutic exercises,97530- Therapeutic [phone]- Neuromuscular re-education,97535- Self Rjmz,02859- Manual therapy,Patient/Family education}  PLAN FOR NEXT SESSION: ***   Reyes LOISE London, PT 03/08/2024, 11:58 AM        "

## 2024-03-13 ENCOUNTER — Ambulatory Visit

## 2024-03-15 ENCOUNTER — Ambulatory Visit

## 2024-03-20 ENCOUNTER — Ambulatory Visit

## 2024-03-22 ENCOUNTER — Ambulatory Visit

## 2024-03-22 NOTE — Therapy (Incomplete)
 " OUTPATIENT PHYSICAL THERAPY NEURO EVALUATION   Patient Name: Jason Washington MRN: [id number] DOB:[birth year], 79 y.o., male Today's Date: 03/22/2024   PCP: Jason Pepper, PA-C REFERRING PROVIDER: Edsel Pepper, PA-C  END OF SESSION:   Past Medical History:  Diagnosis Date   (HFpEF) heart failure with preserved ejection fraction (HCC)    Actinic keratosis    Aortic atherosclerosis    Basal cell carcinoma 11/21/2019   right superior lat pectoral/ EDC   Basal cell carcinoma 08/12/2022   Right sideburn. Mohs pending.   Bradycardia    CAD (coronary artery disease)    Cervical spinal stenosis    Chronic GERD    DDD (degenerative disc disease), lumbar    Dry eyes    First degree AV block    Genital herpes    Hepatic steatosis    HLD (hyperlipidemia)    Hypertension    Imbalance    Long-term use of aspirin  therapy    Lymphocytic colitis    Melanoma (HCC)    R knee several years ago    Meningioma (HCC)    a.) measures approximately 5.1 cm; LEFT middle cranial fossa with adjacent mass effect and vasogenic edema causing 5-6 mm rightward midline shift   Mild cognitive impairment with memory loss    Mobitz type 1 second degree AV block    Multiple pulmonary nodules    Nephrolithiasis    Neuropathy    PAF (paroxysmal atrial fibrillation) (HCC)    a.) CHA2DS2VASc = 6 (age x2, HFpEF, HTN, vascular disease, T2DM) as of 11/03/2023; b.) rate/rhythm maintained) daily without the need for pharmacological intervention; no chronic OAC   Prostate CA (HCC)    RBBB (right bundle branch block)    Sleep apnea    a.) unable to tolerate nocturnal PAP therapy   Squamous cell carcinoma of skin 12/22/2021   left forearm-anterior treated wiht EDC   Status post bilateral cataract extraction    T2DM (type 2 diabetes mellitus) (HCC)    Thoracic aortic aneurysm    Tobacco use    Past Surgical History:  Procedure Laterality Date   APPENDECTOMY     CATARACT EXTRACTION W/PHACO Left  08/03/2022   Procedure: CATARACT EXTRACTION PHACO AND INTRAOCULAR LENS PLACEMENT (IOC) LEFT CLAREON PANOPTIX TORIC  9.11  00:47.4;  Surgeon: Jason Fallow, MD;  Location: [facility];  Service: Ophthalmology;  Laterality: Left;   CATARACT EXTRACTION W/PHACO Right 08/17/2022   Procedure: CATARACT EXTRACTION PHACO AND INTRAOCULAR LENS PLACEMENT (IOC) RIGHT CLAREON PANOPTIX TORIC;  Surgeon: Jason Fallow, MD;  Location: [facility];  Service: Ophthalmology;  Laterality: Right;  14.56 1:18.0   COLONOSCOPY WITH PROPOFOL  N/A 10/07/2021   Procedure: COLONOSCOPY WITH PROPOFOL ;  Surgeon: Toledo, Ladell POUR, MD;  Location: [facility];  Service: Endoscopy;  Laterality: N/A;   CYSTOSCOPY W/ RETROGRADES  11/07/2023   Procedure: CYSTOSCOPY, WITH RETROGRADE PYELOGRAM;  Surgeon: Jason Redell BROCKS, MD;  Location: [facility];  Service: Urology;;   CYSTOSCOPY WITH STENT PLACEMENT Right 10/16/2023   Procedure: CYSTOSCOPY, WITH STENT INSERTION;  Surgeon: Jason Belvie CROME, MD;  Location: [facility];  Service: Urology;  Laterality: Right;   CYSTOSCOPY/URETEROSCOPY/HOLMIUM LASER/STENT PLACEMENT Right 11/07/2023   Procedure: CYSTOSCOPY/URETEROSCOPY/HOLMIUM LASER/STENT PLACEMENT;  Surgeon: Jason Redell BROCKS, MD;  Location: [facility];  Service: Urology;  Laterality: Right;  EXCHANGE   HERNIA REPAIR     PROSTATE SURGERY     TONSILLECTOMY     Patient Active Problem List   Diagnosis Date Noted   Ascending  aortic aneurysm 10/18/2023   Bacteremia due to Proteus species 10/18/2023   Meningioma (HCC) 10/15/2023   Smoker 10/15/2023   Sepsis due to urinary tract infection (HCC) 10/14/2023   Acute metabolic encephalopathy 10/14/2023   Chronic diastolic CHF (congestive heart failure) (HCC) 10/14/2023   Myocardial injury 10/14/2023   Obesity (BMI 30-39.9) 10/14/2023   Hypertension    Mild cognitive impairment with memory loss    Sleep apnea    HLD (hyperlipidemia)    Diabetes mellitus without complication  (HCC)    Dyspnea on exertion 11/05/2019   Witnessed episode of apnea 11/05/2019   Pulmonary nodules 11/05/2019    ONSET DATE: ***  REFERRING DIAG:  R26.81 (ICD-10-CM) - Unsteady gait  R53.1 (ICD-10-CM) - Generalized weakness  M47.816 (ICD-10-CM) - Spondylosis of lumbar region without myelopathy or radiculopathy  M51.369 (ICD-10-CM) - Degeneration of intervertebral disc of lumbar region, unspecified whether pain present    THERAPY DIAG:  No diagnosis found.  Rationale for Evaluation and Treatment: Rehabilitation  SUBJECTIVE:                                                                                                                                                                                             SUBJECTIVE STATEMENT: *** Pt accompanied by: {accompnied:27141}  PERTINENT HISTORY: Referral from [facility] Neurology on 03/06/2024- for balance and gait and Generalized weakness, Spondylosis of lumbar region without myelopathy or radiculopathy, and degeneration of intervertebral disc of lumbar region. Pertinent history obtained from recent [facility] neurology telehealth visit on 02/29/2024. History of Present Illness Jason Washington. is a 79 year old male with a meningioma who presents with unsteady gait and speech difficulties.  He experiences an unsteady gait, describing his walking as 'not secure' and losing balance easily. There is no significant pain in his hip or legs, but he feels generalized weakness, especially when standing up from a chair. He cannot feel his body weight on his hips and experiences fatigue quickly after walking short distances, requiring frequent rests. He presented to the ED two weeks ago for concern of generalized weakness - his workup was unremarkable and he was advised to follow-up outpatient. Although he has not been using any assistive devices regularly, he found a walker helpful during a particularly weak spell.  He reports speech difficulties,  including slurred speech and trouble finding words, which he feels is worsening over time. He is concerned about his ability to communicate effectively, which is frustrating for both him and his wife.  He has a known meningioma, which was stable on a recent head CT performed two weeks ago. He is followed by neurosurgery  and has decided to withhold surgery in the setting of various medical illnesses including urosepsis and nephrolithiasis. He has been followed by neurology in the past and has been prescribed Aricept  for memory issues, however his wife discontinued his medicatio nin 05/2023 because she didn't want him taking this. He also has known history of B12 deficiency, requiring injections in the past. He takes oral B12 supplements daily.   He denies any pain in his hips or knees when walking and no leg cramps or pain that improves with rest. He describes his symptoms as physical fatigue that resolves quickly with rest. He has known lumbar degenerative disc disease, lumbar spondylosis, and neuropathy, for which he has been followed by physiatry in the past. He was prescribed gabapentin in the past but is no longer taking this.    PAIN:  Are you having pain? {OPRCPAIN:27236}  PRECAUTIONS: {Therapy precautions:24002}  RED FLAGS: {PT Red Flags:29287}   WEIGHT BEARING RESTRICTIONS: {Yes ***/No:24003}  FALLS: Has patient fallen in last 6 months? {fallsyesno:27318}  LIVING ENVIRONMENT: Lives with: {OPRC lives with:25569::lives with their family} Lives in: {Lives in:25570} Stairs: {opstairs:27293} Has following equipment at home: {Assistive devices:23999}  PLOF: {PLOF:24004}  PATIENT GOALS: ***  OBJECTIVE:  Note: Objective measures were completed at Evaluation unless otherwise noted.  DIAGNOSTIC FINDINGS: CT HEAD WITHOUT 02/14/2024 02:47:08 PM   TECHNIQUE: CT of the head was performed without the administration of intravenous contrast. Automated exposure control, iterative  reconstruction, and/or weight based adjustment of the mA/kV was utilized to reduce the radiation dose to as low as reasonably achievable.   COMPARISON: 10/14/2023   CLINICAL HISTORY: weakness, brain tumor   FINDINGS:   BRAIN AND VENTRICLES: A large calcified mass in the left middle cranial fossa is again noted with surrounding vasogenic edema. Cystic components are again noted superiorly. 6 mm of left to right midline shift. This has previously been characterized as a meningioma on prior MRI. This is unchanged. The amount of surrounding vasogenic edema is stable. Left posterior fossa arachnoid cyst again noted, stable. No acute infarct or hemorrhage. No hydrocephalus. No extra-axial fluid collection.   ORBITS: No acute abnormality.   SINUSES AND MASTOIDS: No acute abnormality.   SOFT TISSUES AND SKULL: No acute skull fracture. No acute soft tissue abnormality.   IMPRESSION: 1. Large calcified left middle cranial fossa mass with surrounding vasogenic edema, cystic components superiorly, and 6 mm of left-to-right midline shift, previously characterized as a meningioma, stable. 2. No acute intracranial abnormality.   Electronically signed by: Franky Crease MD 02/14/2024 03:38 PM     COGNITION: Overall cognitive status: {cognition:24006}   SENSATION: {sensation:27233}  COORDINATION: ***  EDEMA:  {edema:24020}  MUSCLE TONE: {LE tone:25568}  MUSCLE LENGTH: Hamstrings: Right *** deg; Left *** deg Debby test: Right *** deg; Left *** deg  DTRs:  {DTR SITE:24025}  POSTURE: {posture:25561}  LOWER EXTREMITY ROM:     {AROM/PROM:27142}  Right Eval Left Eval  Hip flexion    Hip extension    Hip abduction    Hip adduction    Hip internal rotation    Hip external rotation    Knee flexion    Knee extension    Ankle dorsiflexion    Ankle plantarflexion    Ankle inversion    Ankle eversion     (Blank rows = not tested)  LOWER EXTREMITY MMT:    MMT  Right Eval Left Eval  Hip flexion    Hip extension    Hip abduction    Hip  adduction    Hip internal rotation    Hip external rotation    Knee flexion    Knee extension    Ankle dorsiflexion    Ankle plantarflexion    Ankle inversion    Ankle eversion    (Blank rows = not tested)  BED MOBILITY:  {bed mobility:32615:p}  TRANSFERS: {transfers eval:32620}  RAMP:  {ramp eval:32616}  CURB:  {curb eval:32617}  STAIRS: {stairs eval:32618} GAIT: Findings: {GaitneuroPT:32644::Distance walked: ***,Comments: ***}  FUNCTIONAL TESTS:  {Functional tests:24029}  PATIENT SURVEYS:  {rehab surveys:24030}                                                                                                                              TREATMENT DATE: ***    PATIENT EDUCATION: Education details: *** Person educated: {Person educated:25204} Education method: {Education Method:25205} Education comprehension: {Education Comprehension:25206}  HOME EXERCISE PROGRAM: ***  GOALS: Goals reviewed with patient? {yes/no:20286}  SHORT TERM GOALS: Target date: ***  *** Baseline: Goal status: INITIAL  2.  *** Baseline:  Goal status: INITIAL  3.  *** Baseline:  Goal status: INITIAL  4.  *** Baseline:  Goal status: INITIAL  5.  *** Baseline:  Goal status: INITIAL  6.  *** Baseline:  Goal status: INITIAL  LONG TERM GOALS: Target date: ***   1.  Patient will complete five times sit to stand test in < 15 seconds indicating an increased LE strength and improved balance. Baseline: *** Goal status: INITIAL  ***.  Patient will improve *** score to ***   to demonstrate statistically significant improvement in mobility and quality of life as it relates to their ***.  Baseline: *** Goal status: INITIAL   ***.  Patient will increase Berg Balance score by > 6 points to demonstrate decreased fall risk during functional activities. Baseline: *** Goal status: INITIAL   ***.    Patient will reduce timed up and go to <11 seconds to reduce fall risk and demonstrate improved transfer/gait ability. Baseline: *** Goal status: INITIAL  ***.   Patient will increase 10 meter walk test to >1.14m/s as to improve gait speed for better community ambulation and to reduce fall risk. Baseline: *** Goal status: INITIAL  ***.   Patient will increase six minute walk test distance to >1000 for progression to community ambulator and improve gait ability Baseline: *** Goal status: INITIAL    2.  *** Baseline:  Goal status: INITIAL  3.  *** Baseline:  Goal status: INITIAL  4.  *** Baseline:  Goal status: INITIAL  5.  *** Baseline:  Goal status: INITIAL  6.  *** Baseline:  Goal status: INITIAL  ASSESSMENT:  CLINICAL IMPRESSION: Patient is a *** y.o. *** who was seen today for physical therapy evaluation and treatment for ***.   OBJECTIVE IMPAIRMENTS: {opptimpairments:25111}.   ACTIVITY LIMITATIONS: {activitylimitations:27494}  PARTICIPATION LIMITATIONS: {participationrestrictions:25113}  PERSONAL FACTORS: {Personal factors:25162} are also affecting patient's functional outcome.   REHAB POTENTIAL: {rehabpotential:25112}  CLINICAL DECISION MAKING: {clinical decision making:25114}  EVALUATION COMPLEXITY: {Evaluation complexity:25115}  PLAN:  PT FREQUENCY: {rehab frequency:25116}  PT DURATION: {rehab duration:25117}  PLANNED INTERVENTIONS: {rehab planned interventions:25118::97110-Therapeutic exercises,97530- Therapeutic [phone]- Neuromuscular re-education,97535- Self Rjmz,02859- Manual therapy,Patient/Family education}  PLAN FOR NEXT SESSION: ***   Reyes LOISE London, PT 03/22/2024, 3:11 PM        "

## 2024-03-27 ENCOUNTER — Ambulatory Visit

## 2024-03-29 ENCOUNTER — Ambulatory Visit

## 2024-04-03 ENCOUNTER — Ambulatory Visit

## 2024-04-05 ENCOUNTER — Ambulatory Visit

## 2024-04-10 ENCOUNTER — Ambulatory Visit

## 2024-04-12 ENCOUNTER — Ambulatory Visit

## 2024-04-17 ENCOUNTER — Ambulatory Visit

## 2024-04-24 ENCOUNTER — Ambulatory Visit

## 2024-04-26 ENCOUNTER — Ambulatory Visit

## 2024-05-01 ENCOUNTER — Ambulatory Visit

## 2024-05-03 ENCOUNTER — Ambulatory Visit

## 2024-05-08 ENCOUNTER — Ambulatory Visit

## 2024-05-09 ENCOUNTER — Ambulatory Visit: Admitting: Dermatology

## 2024-05-10 ENCOUNTER — Ambulatory Visit

## 2024-05-15 ENCOUNTER — Ambulatory Visit

## 2024-05-17 ENCOUNTER — Ambulatory Visit

## 2024-05-22 ENCOUNTER — Ambulatory Visit

## 2024-05-23 ENCOUNTER — Ambulatory Visit: Admitting: Dermatology

## 2024-05-24 ENCOUNTER — Ambulatory Visit

## 2024-05-29 ENCOUNTER — Ambulatory Visit

## 2024-05-31 ENCOUNTER — Ambulatory Visit

## 2024-06-05 ENCOUNTER — Ambulatory Visit

## 2024-06-07 ENCOUNTER — Ambulatory Visit

## 2024-06-12 ENCOUNTER — Ambulatory Visit

## 2024-06-14 ENCOUNTER — Ambulatory Visit

## 2024-06-19 ENCOUNTER — Ambulatory Visit

## 2024-06-21 ENCOUNTER — Ambulatory Visit

## 2024-06-26 ENCOUNTER — Ambulatory Visit

## 2024-06-28 ENCOUNTER — Ambulatory Visit
